# Patient Record
Sex: Female | Born: 1990 | Race: Black or African American | Hispanic: No | Marital: Single | Smoking: Former smoker
Health system: Southern US, Community
[De-identification: ages and names within clinical notes are randomized; demographics above are authoritative.]

## PROBLEM LIST (undated history)

## (undated) DIAGNOSIS — T7840XA Allergy, unspecified, initial encounter: Secondary | ICD-10-CM

## (undated) DIAGNOSIS — D649 Anemia, unspecified: Secondary | ICD-10-CM

## (undated) DIAGNOSIS — I10 Essential (primary) hypertension: Secondary | ICD-10-CM

## (undated) DIAGNOSIS — G43909 Migraine, unspecified, not intractable, without status migrainosus: Secondary | ICD-10-CM

## (undated) DIAGNOSIS — A549 Gonococcal infection, unspecified: Secondary | ICD-10-CM

## (undated) DIAGNOSIS — E7800 Pure hypercholesterolemia, unspecified: Secondary | ICD-10-CM

## (undated) DIAGNOSIS — K219 Gastro-esophageal reflux disease without esophagitis: Secondary | ICD-10-CM

## (undated) DIAGNOSIS — E119 Type 2 diabetes mellitus without complications: Secondary | ICD-10-CM

## (undated) DIAGNOSIS — A749 Chlamydial infection, unspecified: Secondary | ICD-10-CM

## (undated) DIAGNOSIS — J45909 Unspecified asthma, uncomplicated: Secondary | ICD-10-CM

## (undated) DIAGNOSIS — K3580 Unspecified acute appendicitis: Secondary | ICD-10-CM

## (undated) DIAGNOSIS — N390 Urinary tract infection, site not specified: Secondary | ICD-10-CM

## (undated) HISTORY — DX: Allergy, unspecified, initial encounter: T78.40XA

## (undated) HISTORY — DX: Gastro-esophageal reflux disease without esophagitis: K21.9

---
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
## 2012-12-09 ENCOUNTER — Emergency Department (HOSPITAL_COMMUNITY)
Admission: EM | Admit: 2012-12-09 | Discharge: 2012-12-09 | Disposition: A | Discharge status: Home / Self Care | Payer: Self-pay | Attending: Emergency Medicine | Admitting: Emergency Medicine

## 2012-12-09 ENCOUNTER — Encounter (HOSPITAL_COMMUNITY): Payer: Self-pay | Admitting: Emergency Medicine

## 2012-12-09 DIAGNOSIS — R110 Nausea: Secondary | ICD-10-CM | POA: Insufficient documentation

## 2012-12-09 DIAGNOSIS — R51 Headache: Secondary | ICD-10-CM | POA: Insufficient documentation

## 2012-12-09 DIAGNOSIS — Z8679 Personal history of other diseases of the circulatory system: Secondary | ICD-10-CM | POA: Insufficient documentation

## 2012-12-09 DIAGNOSIS — Z79899 Other long term (current) drug therapy: Secondary | ICD-10-CM | POA: Insufficient documentation

## 2012-12-09 DIAGNOSIS — H53149 Visual discomfort, unspecified: Secondary | ICD-10-CM | POA: Insufficient documentation

## 2012-12-09 DIAGNOSIS — F17200 Nicotine dependence, unspecified, uncomplicated: Secondary | ICD-10-CM | POA: Insufficient documentation

## 2012-12-09 HISTORY — DX: Migraine, unspecified, not intractable, without status migrainosus: G43.909

## 2012-12-09 MED ORDER — METOCLOPRAMIDE HCL 5 MG/ML IJ SOLN
10.0000 mg | Freq: Once | INTRAMUSCULAR | Status: AC
  Administered 2012-12-09: 10 mg via INTRAMUSCULAR
  Filled 2012-12-09: qty 2

## 2012-12-09 MED ORDER — DIPHENHYDRAMINE HCL 50 MG/ML IJ SOLN
25.0000 mg | Freq: Once | INTRAMUSCULAR | Status: AC
  Administered 2012-12-09: 25 mg via INTRAMUSCULAR
  Filled 2012-12-09: qty 1

## 2012-12-09 NOTE — ED Provider Notes (Signed)
CSN: [id number]     Arrival date & time 12/09/12  1705 History   First MD Initiated Contact with Patient 12/09/12 2016     Chief Complaint  Patient presents with  . Headache   (Consider location/radiation/quality/duration/timing/severity/associated sxs/prior Treatment) HPI Comments: Patient is a 22 year old female past medical history significant for migraines and tobacco use presented to the emergency department for 3 days of intermittent by temporal sharp headache with intermittent associated blurry vision and nausea. Patient denies any blurry vision or nausea at this time. Patient endorses that she has tried [facility] powder and Tylenol with no relief. She states light aggravates her pain. She rates her pain 7/10. She denies any numbness or weakness, vomiting, chest pain, shortness of breath, abdominal pain.   Past Medical History  Diagnosis Date  . Migraine    History reviewed. No pertinent past surgical history. History reviewed. No pertinent family history. History  Substance Use Topics  . Smoking status: Current Every Day Smoker  . Smokeless tobacco: Not on file  . Alcohol Use: Yes     Comment: occ   OB History   Grav Para Term Preterm Abortions TAB SAB Ect Mult Living                 Review of Systems  Constitutional: Negative for fever and chills.  HENT: Negative for neck pain and neck stiffness.   Eyes: Positive for photophobia and visual disturbance.  Respiratory: Negative for shortness of breath.   Cardiovascular: Negative for chest pain.  Gastrointestinal: Positive for nausea.  Neurological: Positive for headaches.  All other systems reviewed and are negative.    Allergies  Review of patient's allergies indicates no known allergies.  Home Medications   Current Outpatient Rx  Name  Route  Sig  Dispense  Refill  . Aspirin-Salicylamide-Caffeine (BC FAST PAIN RELIEF) [id number] MG PACK   Oral   Take 1 Package by mouth every 8 (eight) hours as needed.          Marland Kitchen BIOTIN PO   Oral   Take 1 tablet by mouth daily.          BP 117/83  Pulse 56  Temp(Src) 98.2 F (36.8 C) (Oral)  Resp 12  SpO2 100% Physical Exam  Constitutional: She is oriented to person, place, and time. She appears well-developed and well-nourished. No distress.  Lights on in room upon my entrance.  HENT:  Head: Normocephalic and atraumatic.  Right Ear: External ear normal.  Left Ear: External ear normal.  Nose: Nose normal.  Mouth/Throat: Oropharynx is clear and moist.  Eyes: Conjunctivae and EOM are normal. Pupils are equal, round, and reactive to light.  Neck: Normal range of motion and full passive range of motion without pain. Neck supple. No rigidity. No edema present.  Cardiovascular: Normal rate, regular rhythm, normal heart sounds and intact distal pulses.   Pulmonary/Chest: Effort normal and breath sounds normal. No respiratory distress.  Abdominal: Soft. Bowel sounds are normal. There is no tenderness.  Musculoskeletal: Normal range of motion. She exhibits no edema.  Lymphadenopathy:    She has no cervical adenopathy.  Neurological: She is alert and oriented to person, place, and time. She has normal strength. No cranial nerve deficit or sensory deficit. GCS eye subscore is 4. GCS verbal subscore is 5. GCS motor subscore is 6.  No pronator drift. Bilateral heel-knee-and intact.  Skin: Skin is warm and dry. She is not diaphoretic.  Psychiatric: She has a normal mood and affect.  ED Course  Procedures (including critical care time) Medications  metoCLOPramide (REGLAN) injection 10 mg (10 mg Intramuscular Given 12/09/12 2058)  diphenhydrAMINE (BENADRYL) injection 25 mg (25 mg Intramuscular Given 12/09/12 2058)    Labs Review Labs Reviewed - No data to display Imaging Review No results found.  MDM   1. Headache    Afebrile, NAD, non-toxic appearing, AAOx4. Pt HA treated and improved while in ED.  Presentation is like pts typical HA and non  concerning for [facility], ICH, Meningitis, or temporal arteritis. Pt is afebrile with no focal neuro deficits, nuchal rigidity, or change in vision. Pt is to follow up with a PCP to discuss prophylactic medication. Pt verbalizes understanding and is agreeable with plan to dc. Patient d/w with Dr. Rubin Payor, agrees with plan.        Jeannetta Ellis, PA-C 12/10/12 0134

## 2012-12-09 NOTE — ED Notes (Signed)
Pt c/o HA x 3 days with blurry vision and nausea; pt sts hx of migraine

## 2012-12-11 NOTE — ED Provider Notes (Signed)
Medical screening examination/treatment/procedure(s) were performed by non-physician practitioner and as supervising physician I was immediately available for consultation/collaboration.  Juliet Rude. Rubin Payor, MD 12/11/12 [id number]

## 2014-01-08 ENCOUNTER — Other Ambulatory Visit: Payer: Self-pay | Admitting: Obstetrics & Gynecology

## 2014-01-08 DIAGNOSIS — O3680X0 Pregnancy with inconclusive fetal viability, not applicable or unspecified: Secondary | ICD-10-CM

## 2014-01-11 ENCOUNTER — Ambulatory Visit: Payer: Medicaid Other

## 2014-03-23 ENCOUNTER — Other Ambulatory Visit: Payer: Self-pay | Admitting: Obstetrics & Gynecology

## 2014-03-23 DIAGNOSIS — O3432 Maternal care for cervical incompetence, second trimester: Secondary | ICD-10-CM

## 2014-03-24 ENCOUNTER — Emergency Department (HOSPITAL_COMMUNITY)
Admission: EM | Admit: 2014-03-24 | Discharge: 2014-03-25 | Disposition: A | Discharge status: Home / Self Care | Payer: Medicaid Other | Attending: Emergency Medicine | Admitting: Emergency Medicine

## 2014-03-24 ENCOUNTER — Encounter (HOSPITAL_COMMUNITY): Payer: Self-pay

## 2014-03-24 DIAGNOSIS — R1084 Generalized abdominal pain: Secondary | ICD-10-CM | POA: Insufficient documentation

## 2014-03-24 DIAGNOSIS — O9989 Other specified diseases and conditions complicating pregnancy, childbirth and the puerperium: Secondary | ICD-10-CM | POA: Insufficient documentation

## 2014-03-24 DIAGNOSIS — Z3A00 Weeks of gestation of pregnancy not specified: Secondary | ICD-10-CM | POA: Insufficient documentation

## 2014-03-24 DIAGNOSIS — N390 Urinary tract infection, site not specified: Secondary | ICD-10-CM

## 2014-03-24 DIAGNOSIS — Z8679 Personal history of other diseases of the circulatory system: Secondary | ICD-10-CM | POA: Insufficient documentation

## 2014-03-24 DIAGNOSIS — O2342 Unspecified infection of urinary tract in pregnancy, second trimester: Secondary | ICD-10-CM | POA: Diagnosis not present

## 2014-03-24 DIAGNOSIS — Z87891 Personal history of nicotine dependence: Secondary | ICD-10-CM | POA: Diagnosis not present

## 2014-03-24 DIAGNOSIS — Z331 Pregnant state, incidental: Secondary | ICD-10-CM

## 2014-03-24 DIAGNOSIS — Z79899 Other long term (current) drug therapy: Secondary | ICD-10-CM | POA: Insufficient documentation

## 2014-03-24 DIAGNOSIS — O212 Late vomiting of pregnancy: Secondary | ICD-10-CM | POA: Diagnosis not present

## 2014-03-24 LAB — URINE MICROSCOPIC-ADD ON

## 2014-03-24 LAB — URINALYSIS, ROUTINE W REFLEX MICROSCOPIC
Bilirubin Urine: NEGATIVE
GLUCOSE, UA: NEGATIVE mg/dL
Ketones, ur: NEGATIVE mg/dL
Nitrite: NEGATIVE
Protein, ur: NEGATIVE mg/dL
Specific Gravity, Urine: <1.005 — ABNORMAL LOW (ref 1.005–1.030)
Urobilinogen, UA: 0.2 mg/dL (ref 0.0–1.0)
pH: 6 (ref 5.0–8.0)

## 2014-03-24 LAB — CBC WITH DIFFERENTIAL/PLATELET
Basophils Absolute: 0 10*3/uL (ref 0.0–0.1)
Basophils Relative: 0 % (ref 0–1)
Eosinophils Absolute: 0 10*3/uL (ref 0.0–0.7)
Eosinophils Relative: 0 % (ref 0–5)
HCT: 33 % — ABNORMAL LOW (ref 36.0–46.0)
Hemoglobin: 10.8 g/dL — ABNORMAL LOW (ref 12.0–15.0)
LYMPHS PCT: 8 % — AB (ref 12–46)
Lymphs Abs: 0.9 10*3/uL (ref 0.7–4.0)
MCH: 23.9 pg — ABNORMAL LOW (ref 26.0–34.0)
MCHC: 32.7 g/dL (ref 30.0–36.0)
MCV: 73.2 fL — AB (ref 78.0–100.0)
MONO ABS: 0.8 10*3/uL (ref 0.1–1.0)
Monocytes Relative: 7 % (ref 3–12)
Neutro Abs: 9.7 10*3/uL — ABNORMAL HIGH (ref 1.7–7.7)
Neutrophils Relative %: 85 % — ABNORMAL HIGH (ref 43–77)
Platelets: 207 10*3/uL (ref 150–400)
RBC: 4.51 MIL/uL (ref 3.87–5.11)
RDW: 14.8 % (ref 11.5–15.5)
WBC: 11.4 10*3/uL — AB (ref 4.0–10.5)

## 2014-03-24 LAB — BASIC METABOLIC PANEL
ANION GAP: 7 (ref 5–15)
BUN: 7 mg/dL (ref 6–23)
CO2: 24 mmol/L (ref 19–32)
Calcium: 8.5 mg/dL (ref 8.4–10.5)
Chloride: 101 mEq/L (ref 96–112)
Creatinine, Ser: 0.88 mg/dL (ref 0.50–1.10)
GFR calc Af Amer: >90 mL/min (ref >90)
GFR calc non Af Amer: >90 mL/min (ref >90)
GLUCOSE: 82 mg/dL (ref 70–99)
POTASSIUM: 3.4 mmol/L — AB (ref 3.5–5.1)
SODIUM: 132 mmol/L — AB (ref 135–145)

## 2014-03-24 MED ORDER — ONDANSETRON 8 MG PO TBDP
8.0000 mg | ORAL_TABLET | Freq: Once | ORAL | Status: AC
  Administered 2014-03-24: 8 mg via ORAL
  Filled 2014-03-24: qty 1

## 2014-03-24 MED ORDER — CEPHALEXIN 500 MG PO CAPS
500.0000 mg | ORAL_CAPSULE | Freq: Once | ORAL | Status: AC
  Administered 2014-03-24: 500 mg via ORAL
  Filled 2014-03-24: qty 1

## 2014-03-24 MED ORDER — CEPHALEXIN 500 MG PO CAPS: 500.0000 mg | ORAL_CAPSULE | Freq: Four times a day (QID) | ORAL | Status: DC

## 2014-03-24 NOTE — Discharge Instructions (Signed)
Urinary Tract Infection Urinary tract infections (UTIs) can develop anywhere along your urinary tract. Your urinary tract is your body's drainage system for removing wastes and extra water. Your urinary tract includes two kidneys, two ureters, a bladder, and a urethra. Your kidneys are a pair of bean-shaped organs. Each kidney is about the size of your fist. They are located below your ribs, one on each side of your spine. CAUSES Infections are caused by microbes, which are microscopic organisms, including fungi, viruses, and bacteria. These organisms are so small that they can only be seen through a microscope. Bacteria are the microbes that most commonly cause UTIs. SYMPTOMS  Symptoms of UTIs may vary by age and gender of the patient and by the location of the infection. Symptoms in young women typically include a frequent and intense urge to urinate and a painful, burning feeling in the bladder or urethra during urination. Older women and men are more likely to be tired, shaky, and weak and have muscle aches and abdominal pain. A fever may mean the infection is in your kidneys. Other symptoms of a kidney infection include pain in your back or sides below the ribs, nausea, and vomiting. DIAGNOSIS To diagnose a UTI, your caregiver will ask you about your symptoms. Your caregiver also will ask to provide a urine sample. The urine sample will be tested for bacteria and white blood cells. White blood cells are made by your body to help fight infection. TREATMENT  Typically, UTIs can be treated with medication. Because most UTIs are caused by a bacterial infection, they usually can be treated with the use of antibiotics. The choice of antibiotic and length of treatment depend on your symptoms and the type of bacteria causing your infection. HOME CARE INSTRUCTIONS  If you were prescribed antibiotics, take them exactly as your caregiver instructs you. Finish the medication even if you feel better after you  have only taken some of the medication.  Drink enough water and fluids to keep your urine clear or pale yellow.  Avoid caffeine, tea, and carbonated beverages. They tend to irritate your bladder.  Empty your bladder often. Avoid holding urine for long periods of time.  Empty your bladder before and after sexual intercourse.  After a bowel movement, women should cleanse from front to back. Use each tissue only once. SEEK MEDICAL CARE IF:   You have back pain.  You develop a fever.  Your symptoms do not begin to resolve within 3 days. SEEK IMMEDIATE MEDICAL CARE IF:   You have severe back pain or lower abdominal pain.  You develop chills.  You have nausea or vomiting.  You have continued burning or discomfort with urination. MAKE SURE YOU:   Understand these instructions.  Will watch your condition.  Will get help right away if you are not doing well or get worse. Document Released: 12/13/2004 Document Revised: 09/04/2011 Document Reviewed: 04/13/2011 [facility] Patient Information 2015 [place], [place]. This information is not intended to replace advice given to you by your health care provider. Make sure you discuss any questions you have with your health care provider.  Second Trimester of Pregnancy The second trimester is from week 13 through week 28, months 4 through 6. The second trimester is often a time when you feel your best. Your body has also adjusted to being pregnant, and you begin to feel better physically. Usually, morning sickness has lessened or quit completely, you may have more energy, and you may have an increase in appetite.  The second trimester is also a time when the fetus is growing rapidly. At the end of the sixth month, the fetus is about 9 inches long and weighs about 1 pounds. You will likely begin to feel the baby move (quickening) between 18 and 20 weeks of the pregnancy. BODY CHANGES Your body goes through many changes during pregnancy. The changes  vary from woman to woman.   Your weight will continue to increase. You will notice your lower abdomen bulging out.  You may begin to get stretch marks on your hips, abdomen, and breasts.  You may develop headaches that can be relieved by medicines approved by your health care provider.  You may urinate more often because the fetus is pressing on your bladder.  You may develop or continue to have heartburn as a result of your pregnancy.  You may develop constipation because certain hormones are causing the muscles that push waste through your intestines to slow down.  You may develop hemorrhoids or swollen, bulging veins (varicose veins).  You may have back pain because of the weight gain and pregnancy hormones relaxing your joints between the bones in your pelvis and as a result of a shift in weight and the muscles that support your balance.  Your breasts will continue to grow and be tender.  Your gums may bleed and may be sensitive to brushing and flossing.  Dark spots or blotches (chloasma, mask of pregnancy) may develop on your face. This will likely fade after the baby is born.  A dark line from your belly button to the pubic area (linea nigra) may appear. This will likely fade after the baby is born.  You may have changes in your hair. These can include thickening of your hair, rapid growth, and changes in texture. Some women also have hair loss during or after pregnancy, or hair that feels dry or thin. Your hair will most likely return to normal after your baby is born. WHAT TO EXPECT AT YOUR PRENATAL VISITS During a routine prenatal visit:  You will be weighed to make sure you and the fetus are growing normally.  Your blood pressure will be taken.  Your abdomen will be measured to track your baby's growth.  The fetal heartbeat will be listened to.  Any test results from the previous visit will be discussed. Your health care provider may ask you:  How you are  feeling.  If you are feeling the baby move.  If you have had any abnormal symptoms, such as leaking fluid, bleeding, severe headaches, or abdominal cramping.  If you have any questions. Other tests that may be performed during your second trimester include:  Blood tests that check for:  Low iron levels (anemia).  Gestational diabetes (between 24 and 28 weeks).  Rh antibodies.  Urine tests to check for infections, diabetes, or protein in the urine.  An ultrasound to confirm the proper growth and development of the baby.  An amniocentesis to check for possible genetic problems.  Fetal screens for spina bifida and Down syndrome. HOME CARE INSTRUCTIONS   Avoid all smoking, herbs, alcohol, and unprescribed drugs. These chemicals affect the formation and growth of the baby.  Follow your health care provider's instructions regarding medicine use. There are medicines that are either safe or unsafe to take during pregnancy.  Exercise only as directed by your health care provider. Experiencing uterine cramps is a good sign to stop exercising.  Continue to eat regular, healthy meals.  Wear a good  support bra for breast tenderness.  Do not use hot tubs, steam rooms, or saunas.  Wear your seat belt at all times when driving.  Avoid raw meat, uncooked cheese, cat litter boxes, and soil used by cats. These carry germs that can cause birth defects in the baby.  Take your prenatal vitamins.  Try taking a stool softener (if your health care provider approves) if you develop constipation. Eat more high-fiber foods, such as fresh vegetables or fruit and whole grains. Drink plenty of fluids to keep your urine clear or pale yellow.  Take warm sitz baths to soothe any pain or discomfort caused by hemorrhoids. Use hemorrhoid cream if your health care provider approves.  If you develop varicose veins, wear support hose. Elevate your feet for 15 minutes, 3-4 times a day. Limit salt in your  diet.  Avoid heavy lifting, wear low heel shoes, and practice good posture.  Rest with your legs elevated if you have leg cramps or low back pain.  Visit your dentist if you have not gone yet during your pregnancy. Use a soft toothbrush to brush your teeth and be gentle when you floss.  A sexual relationship may be continued unless your health care provider directs you otherwise.  Continue to go to all your prenatal visits as directed by your health care provider. SEEK MEDICAL CARE IF:   You have dizziness.  You have mild pelvic cramps, pelvic pressure, or nagging pain in the abdominal area.  You have persistent nausea, vomiting, or diarrhea.  You have a bad smelling vaginal discharge.  You have pain with urination. SEEK IMMEDIATE MEDICAL CARE IF:   You have a fever.  You are leaking fluid from your vagina.  You have spotting or bleeding from your vagina.  You have severe abdominal cramping or pain.  You have rapid weight gain or loss.  You have shortness of breath with chest pain.  You notice sudden or extreme swelling of your face, hands, ankles, feet, or legs.  You have not felt your baby move in over an hour.  You have severe headaches that do not go away with medicine.  You have vision changes. Document Released: 02/27/2001 Document Revised: 03/10/2013 Document Reviewed: 05/06/2012 [facility] Patient Information 2015 [place], [place]. This information is not intended to replace advice given to you by your health care provider. Make sure you discuss any questions you have with your health care provider.

## 2014-03-24 NOTE — ED Provider Notes (Signed)
CSN: [id number]     Arrival date & time 03/24/14  2111 History  This chart was scribed for Flint MelterElliott L Tienna Bienkowski, MD by Bronson CurbJacqueline Melvin, ED Scribe. This patient was seen in room APA12/APA12 and the patient's care was started at 10:19 PM.     Chief Complaint  Patient presents with  . Abdominal Pain    The history is provided by the patient. No language interpreter was used.     HPI Comments: Lindsay James is a 24 y.o. female who is 5 month pregnant, who presents to the Emergency Department complaining of constant, generalized abdominal pain for the past 2 days. Patient states she has not eaten aything in the past 2 days. She also notes associated nausea and vomiting whenever she does eat, in addition to dysuria and frequency. Patient denies history of UTI. She reports that she can feel the baby kick and move. She denies any complications with her current pregnancy, but states her cervix was 1.5cm when last checked by her OB/GYN. Patient is currently taking prenatal vitamins, OTC Tylenol and a nightly vaginal suppository for her cervix. Informed her cervix was 1.5 last check by her OBGYN.  She denies any other symptoms.    Past Medical History  Diagnosis Date  . Migraine    History reviewed. No pertinent past surgical history. History reviewed. No pertinent family history. History  Substance Use Topics  . Smoking status: Former Smoker    Quit date: 10/22/2013  . Smokeless tobacco: Not on file  . Alcohol Use: No     Comment: occ   OB History    Gravida Para Term Preterm AB TAB SAB Ectopic Multiple Living   1              Review of Systems  Constitutional: Positive for fever.  Gastrointestinal: Positive for nausea, vomiting and abdominal pain.  Genitourinary: Positive for dysuria and frequency.  All other systems reviewed and are negative.     Allergies  Review of patient's allergies indicates no known allergies.  Home Medications   Prior to Admission medications    Medication Sig Start Date End Date Taking? Authorizing Provider  acetaminophen (TYLENOL) 325 MG tablet Take by mouth daily as needed for mild pain, moderate pain or headache.   Yes Historical Provider, MD  prenatal vitamin w/FE, FA (PRENATAL 1 + 1) 27-1 MG TABS tablet Take 1 tablet by mouth daily at 12 noon.   Yes Historical Provider, MD  PRESCRIPTION MEDICATION Place 1 suppository vaginally at bedtime.   Yes Historical Provider, MD  cephALEXin (KEFLEX) 500 MG capsule Take 1 capsule (500 mg total) by mouth 4 (four) times daily. 03/24/14   Flint MelterElliott L Goldye Tourangeau, MD   Triage Vitals: BP 137/80 mmHg  Pulse 87  Temp(Src) 99.3 F (37.4 C) (Oral)  Resp 24  Ht 5\' 4"  (1.626 m)  Wt 261 lb 1.6 oz (118.434 kg)  BMI 44.80 kg/m2  SpO2 100%  Physical Exam  Constitutional: She is oriented to person, place, and time. She appears well-developed and well-nourished.  HENT:  Head: Normocephalic and atraumatic.  Eyes: Conjunctivae and EOM are normal. Pupils are equal, round, and reactive to light.  Neck: Normal range of motion and phonation normal. Neck supple.  Cardiovascular: Normal rate and regular rhythm.   Pulmonary/Chest: Effort normal and breath sounds normal. She exhibits no tenderness.  Abdominal: Soft. Bowel sounds are normal. She exhibits no distension. There is tenderness. There is no guarding.  Gravid uterus is palpated above the  umbilicus. Mild diffuse tenderness.   Musculoskeletal: Normal range of motion.  Neurological: She is alert and oriented to person, place, and time. She exhibits normal muscle tone.  Skin: Skin is warm and dry.  Psychiatric: She has a normal mood and affect. Her behavior is normal. Judgment and thought content normal.  Nursing note and vitals reviewed.   ED Course  Procedures (including critical care time)  DIAGNOSTIC STUDIES: Oxygen Saturation is 100% on room air, normal by my interpretation.    COORDINATION OF CARE: At 2224 Discussed treatment plan with patient  which includes fluids. Patient agrees.   Labs Review Labs Reviewed  BASIC METABOLIC PANEL - Abnormal; Notable for the following:    Sodium 132 (*)    Potassium 3.4 (*)    All other components within normal limits  CBC WITH DIFFERENTIAL - Abnormal; Notable for the following:    WBC 11.4 (*)    Hemoglobin 10.8 (*)    HCT 33.0 (*)    MCV 73.2 (*)    MCH 23.9 (*)    Neutrophils Relative % 85 (*)    Lymphocytes Relative 8 (*)    Neutro Abs 9.7 (*)    All other components within normal limits  URINALYSIS, ROUTINE W REFLEX MICROSCOPIC - Abnormal; Notable for the following:    Specific Gravity, Urine <1.005 (*)    Hgb urine dipstick TRACE (*)    Leukocytes, UA MODERATE (*)    All other components within normal limits  URINE MICROSCOPIC-ADD ON - Abnormal; Notable for the following:    Squamous Epithelial / LPF FEW (*)    Bacteria, UA FEW (*)    All other components within normal limits  URINE CULTURE    Imaging Review No results found.   EKG Interpretation None      MDM   Final diagnoses:  UTI (lower urinary tract infection)  Pregnancy, normal, incidental    UTI with 2nd trimester pregnancy and no apparent complications. Doubt SBI or metabolic instability.  Nursing Notes Reviewed/ Care Coordinated Applicable Imaging Reviewed Interpretation of Laboratory Data incorporated into ED treatment  The patient appears reasonably screened and/or stabilized for discharge and I doubt any other medical condition or other [facility] requiring further screening, evaluation, or treatment in the ED at this time prior to discharge.  Plan: Home Medications- Keflex; Home Treatments- rest, fluids; return here if the recommended treatment, does not improve the symptoms; Recommended follow up- OB check up in 1 week   I personally performed the services described in this documentation, which was scribed in my presence. The recorded information has been reviewed and is accurate.      Flint Melter, MD 03/25/14 [phone]

## 2014-03-24 NOTE — ED Notes (Signed)
Patient is 5 months pregnant

## 2014-03-24 NOTE — ED Notes (Signed)
Pt was able to drink fluids w/o any difficulties. 

## 2014-03-24 NOTE — ED Notes (Signed)
Patient states abdominal pain with NVD and fever that started yesterday.

## 2014-03-26 LAB — URINE CULTURE: Colony Count: 25000

## 2014-03-30 ENCOUNTER — Other Ambulatory Visit (HOSPITAL_COMMUNITY): Payer: Self-pay | Admitting: Obstetrics and Gynecology

## 2014-03-30 ENCOUNTER — Encounter (HOSPITAL_COMMUNITY): Payer: Self-pay

## 2014-03-30 ENCOUNTER — Ambulatory Visit (HOSPITAL_COMMUNITY)
Admission: RE | Admit: 2014-03-30 | Discharge: 2014-03-30 | Disposition: A | Discharge status: Home / Self Care | Payer: Medicaid Other | Source: Ambulatory Visit | Attending: Obstetrics and Gynecology | Admitting: Obstetrics and Gynecology

## 2014-03-30 ENCOUNTER — Other Ambulatory Visit (HOSPITAL_COMMUNITY): Payer: Self-pay | Admitting: Obstetrics & Gynecology

## 2014-03-30 VITALS — BP 117/58 | HR 63 | Wt 266.0 lb

## 2014-03-30 DIAGNOSIS — IMO0001 Reserved for inherently not codable concepts without codable children: Secondary | ICD-10-CM

## 2014-03-30 DIAGNOSIS — O350XX0 Maternal care for (suspected) central nervous system malformation in fetus, not applicable or unspecified: Secondary | ICD-10-CM | POA: Insufficient documentation

## 2014-03-30 DIAGNOSIS — O3432 Maternal care for cervical incompetence, second trimester: Secondary | ICD-10-CM

## 2014-03-30 DIAGNOSIS — Z3A26 26 weeks gestation of pregnancy: Secondary | ICD-10-CM | POA: Diagnosis not present

## 2014-03-30 DIAGNOSIS — O26872 Cervical shortening, second trimester: Secondary | ICD-10-CM | POA: Insufficient documentation

## 2014-03-30 DIAGNOSIS — O350XX1 Maternal care for (suspected) central nervous system malformation in fetus, fetus 1: Secondary | ICD-10-CM

## 2014-03-30 NOTE — Consult Note (Signed)
Maternal Fetal Medicine Consultation  Requesting Provider(s): Silas Flood, MD  Reason for consultation: Cervical shortening, cerebral ventriculomegaly  HPI: Lindsay James is a 24 yo G1P0, EDD 07/03/2014 who is currently at 26w 3d who is seen for consultation due to bilateral cerebral ventriculomegaly and cervical shortening.  The patient is currently on progesterone suppositories, but has not yet completed a course of betamethasone.  She is asymptomatic and denies contractions, vaginal bleeding or leakage of fluid.  Cerebral ventriculomegaly was noted no recent ultrasound.  Her  Prenatal course has otherwise been uncomplicated.  OB History: OB History    Gravida Para Term Preterm AB TAB SAB Ectopic Multiple Living   1               PMH:  Past Medical History  Diagnosis Date  . Migraine     PSH: History reviewed. No pertinent past surgical history.   Meds:  Current Outpatient Prescriptions on File Prior to Encounter  Medication Sig Dispense Refill  . acetaminophen (TYLENOL) 325 MG tablet Take by mouth daily as needed for mild pain, moderate pain or headache.    . cephALEXin (KEFLEX) 500 MG capsule Take 1 capsule (500 mg total) by mouth 4 (four) times daily. 28 capsule 0  . prenatal vitamin w/FE, FA (PRENATAL 1 + 1) 27-1 MG TABS tablet Take 1 tablet by mouth daily at 12 noon.    Marland Kitchen PRESCRIPTION MEDICATION Place 1 suppository vaginally at bedtime.     No current facility-administered medications on file prior to encounter.   Allergies: No Known Allergies   FH: History reviewed. No pertinent family history.  Denies family history of birth defects or hereditary disorders.   Soc:  Review of Systems: no vaginal bleeding or cramping/contractions, no LOF, no nausea/vomiting. All other systems reviewed and are negative.  PE:   Filed Vitals:   03/30/14 0934  BP: 117/58  Pulse: 63    GEN: well-appearing female ABD: gravid, NT  Ultrasound: Single IUP at 26w 3d Mild  bilateral cerebral ventriculomegaly is noted. (1.1-1.2 cm) A "drooping choroid" sign is noted. The 3rd ventricle does not appear to be dilated. The fetal cavum is poorly visualized. The remainder of the fetal cranial anatomy appears normal. Fetal growth is appropriate (58th %tile) Posterior placenta without previa Normal amniotic fluid volume  TVUS - cervical length 1.3 cm.  Some V-shaped funneling is noted.   A/P: 1) Single IUP at 26w 3d         2) Mild, bilateral cerebral ventriculomegaly - the findings and limitations of the ultrasound were discussed with the patient.  Ventriculomegaly may be associated with an obstructive process.  No spinal defects were appreciated.  The cavum septum pellucidum was not well-visualized which could raise some concerns about possible agenesis of the corpus collosum.  The remainder of the cranial anatomy appears normal.  Ventriculomegaly may be associated with aneuploidy or possible fetal viral infections, although in the absence of other findings, feel that the likelihood is small.  After counseling, the patient elected to undergo cell free fetal DNA and viral serologies (CMV, toxo, parvo).  We also briefly discussed the possibility of neurodevelopmental disorders and the small risk of requiring a VP shunt should worsening ventriculomegaly/ hydrocephalus develop.  Recommend follow up ultrasound with MFM in 4 weeks to reevaluate.  Would consider fetal MRI if unable to visualize the CSP at that time.  The newborn will also likely require imaging studies of the cranium after delivery.  3) Cervical shortening - concur with vaginal progesterone supplementation.  Would also recommend a course of bet[facility]. She is currently asymptomatic - preterm labor precautions given.   Thank you for the opportunity to be a part of the care of Lindsay James. Please contact our office if we can be of further assistance.   I spent  approximately 30 minutes with this patient with over 50% of time spent in face-to-face counseling.  Alpha GulaPaul Maymuna Detzel, MD Maternal Fetal Medicine

## 2014-03-31 ENCOUNTER — Other Ambulatory Visit (HOSPITAL_COMMUNITY): Payer: Self-pay

## 2014-03-31 LAB — TOXOPLASMA ANTIBODIES- IGG AND  IGM
Toxoplasma Antibody- IgM: 5.6 IU/mL (ref <8.0)
Toxoplasma IgG Ratio: <3 IU/mL (ref <7.2)

## 2014-03-31 LAB — CMV ANTIBODY, IGG (EIA)

## 2014-03-31 LAB — CMV IGM

## 2014-04-02 LAB — PARVOVIRUS B19 ANTIBODY, IGG AND IGM
PAROVIRUS B19 IGG ABS: 0.4 {index} (ref <0.9)
PAROVIRUS B19 IGM ABS: 0.1 {index} (ref <0.9)

## 2014-04-08 ENCOUNTER — Other Ambulatory Visit (HOSPITAL_COMMUNITY): Payer: Self-pay

## 2014-04-08 ENCOUNTER — Telehealth (HOSPITAL_COMMUNITY): Payer: Self-pay | Admitting: MS"

## 2014-04-08 NOTE — Telephone Encounter (Signed)
Called Lindsay BoSierra B Lubke to discuss her cell free fetal DNA test results.  Ms. Lindsay James had Panorama testing through [facility].  Testing was offered because of ultrasound finding of fetal cerebral ventriculomegaly.   The patient was identified by name and DOB.  We reviewed that these are within normal limits, showing a less than 1 in 10,000 risk for trisomies 21, 18 and 13, and monosomy X (Turner syndrome).  In addition, the risk for triploidy/vanishing twin and sex chromosome trisomies (47,XXX and 47,XXY) was also low risk.  We reviewed that this testing identifies > 99% of pregnancies with trisomy 5021, trisomy 5413, sex chromosome trisomies (47,XXX and 47,XXY), and triploidy. The detection rate for trisomy 18 is 96%.  The detection rate for monosomy X is ~92%.  The false positive rate is <0.1% for all conditions. Testing was also consistent with female fetal sex.  She understands that this testing does not identify all genetic conditions.    Additionally, maternal serology studies for toxoplasmosis, CMV, and parvovirus were within normal limits. All questions were answered to her satisfaction, she was encouraged to call with additional questions or concerns.  Quinn PlowmanKaren Weda Baumgarner, MS Certified Genetic Counselor 04/08/2014 11:17 AM

## 2014-04-27 ENCOUNTER — Ambulatory Visit (HOSPITAL_COMMUNITY)
Admission: RE | Admit: 2014-04-27 | Discharge: 2014-04-27 | Disposition: A | Discharge status: Home / Self Care | Payer: Medicaid Other | Source: Ambulatory Visit | Attending: Obstetrics and Gynecology | Admitting: Obstetrics and Gynecology

## 2014-04-27 ENCOUNTER — Encounter (HOSPITAL_COMMUNITY): Payer: Self-pay

## 2014-04-27 ENCOUNTER — Other Ambulatory Visit (HOSPITAL_COMMUNITY): Payer: Self-pay | Admitting: Maternal and Fetal Medicine

## 2014-04-27 DIAGNOSIS — O350XX1 Maternal care for (suspected) central nervous system malformation in fetus, fetus 1: Secondary | ICD-10-CM

## 2014-04-27 DIAGNOSIS — IMO0001 Reserved for inherently not codable concepts without codable children: Secondary | ICD-10-CM

## 2014-04-27 DIAGNOSIS — O350XX0 Maternal care for (suspected) central nervous system malformation in fetus, not applicable or unspecified: Secondary | ICD-10-CM | POA: Insufficient documentation

## 2014-04-27 DIAGNOSIS — Z36 Encounter for antenatal screening of mother: Secondary | ICD-10-CM | POA: Diagnosis not present

## 2014-04-27 DIAGNOSIS — O26873 Cervical shortening, third trimester: Secondary | ICD-10-CM | POA: Insufficient documentation

## 2014-04-27 DIAGNOSIS — O09299 Supervision of pregnancy with other poor reproductive or obstetric history, unspecified trimester: Secondary | ICD-10-CM | POA: Insufficient documentation

## 2014-04-27 DIAGNOSIS — O3432 Maternal care for cervical incompetence, second trimester: Secondary | ICD-10-CM | POA: Insufficient documentation

## 2014-04-27 DIAGNOSIS — IMO0002 Reserved for concepts with insufficient information to code with codable children: Secondary | ICD-10-CM | POA: Insufficient documentation

## 2014-04-27 DIAGNOSIS — Z3A30 30 weeks gestation of pregnancy: Secondary | ICD-10-CM | POA: Diagnosis not present

## 2014-05-25 ENCOUNTER — Ambulatory Visit (HOSPITAL_COMMUNITY)
Admission: RE | Admit: 2014-05-25 | Discharge: 2014-05-25 | Disposition: A | Discharge status: Home / Self Care | Payer: Medicaid Other | Source: Ambulatory Visit | Attending: Obstetrics and Gynecology | Admitting: Obstetrics and Gynecology

## 2014-05-25 DIAGNOSIS — O350XX1 Maternal care for (suspected) central nervous system malformation in fetus, fetus 1: Secondary | ICD-10-CM | POA: Diagnosis not present

## 2014-05-25 DIAGNOSIS — IMO0001 Reserved for inherently not codable concepts without codable children: Secondary | ICD-10-CM

## 2014-05-25 DIAGNOSIS — O3432 Maternal care for cervical incompetence, second trimester: Secondary | ICD-10-CM | POA: Diagnosis not present

## 2014-05-25 DIAGNOSIS — Z3A34 34 weeks gestation of pregnancy: Secondary | ICD-10-CM | POA: Insufficient documentation

## 2015-02-02 ENCOUNTER — Encounter (HOSPITAL_COMMUNITY): Payer: Self-pay | Admitting: *Deleted

## 2015-07-22 IMAGING — US US OB FOLLOW-UP
1 series · 12 of 28 positions shown · non-contrast
Comparison: none

[Series 1: us ob follow-up · 0.21mm/px · 12 of 82 slices shown]
[im 4/82]
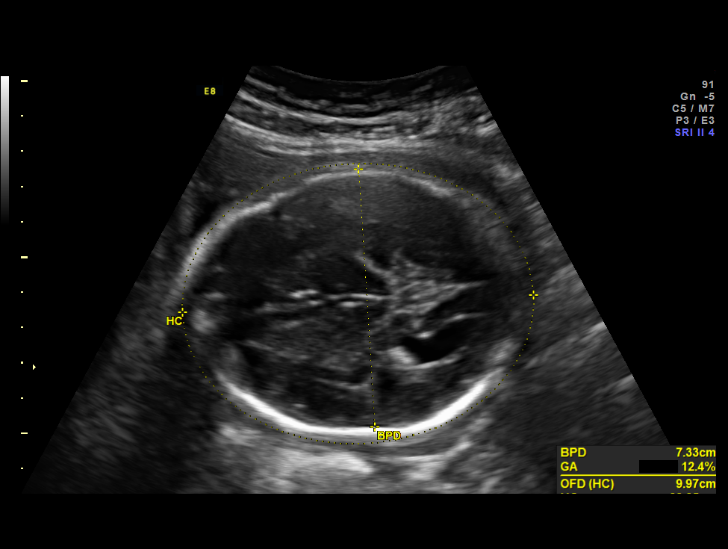
[im 10/82]
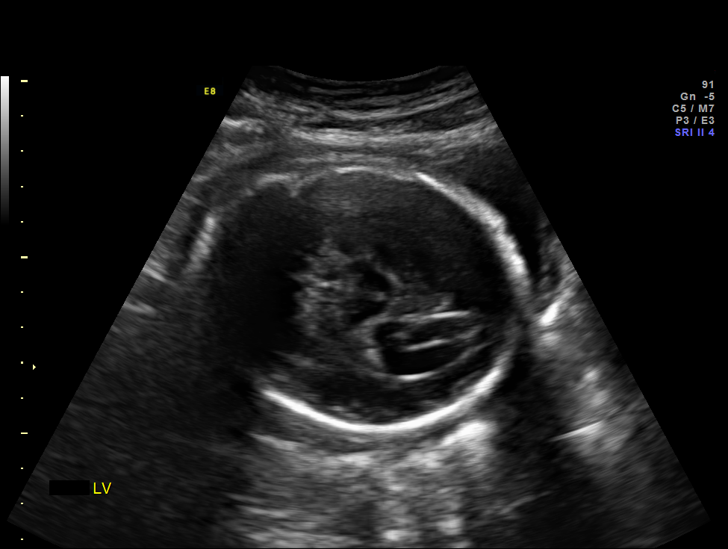
[im 16/82]
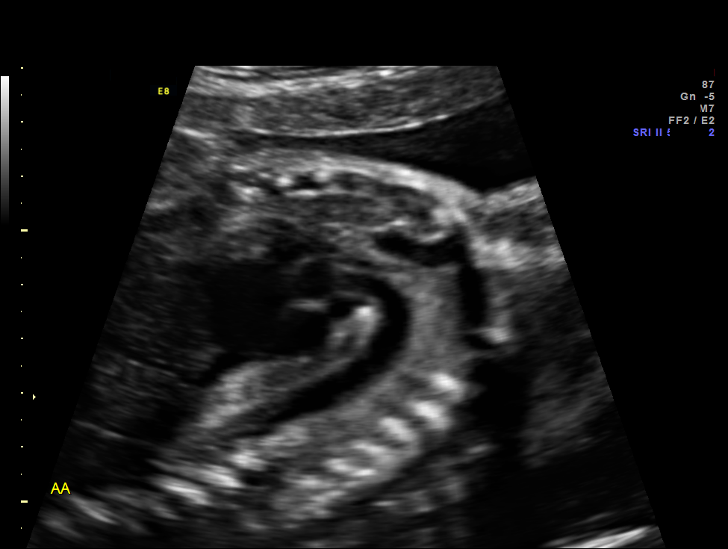
[im 25/82]
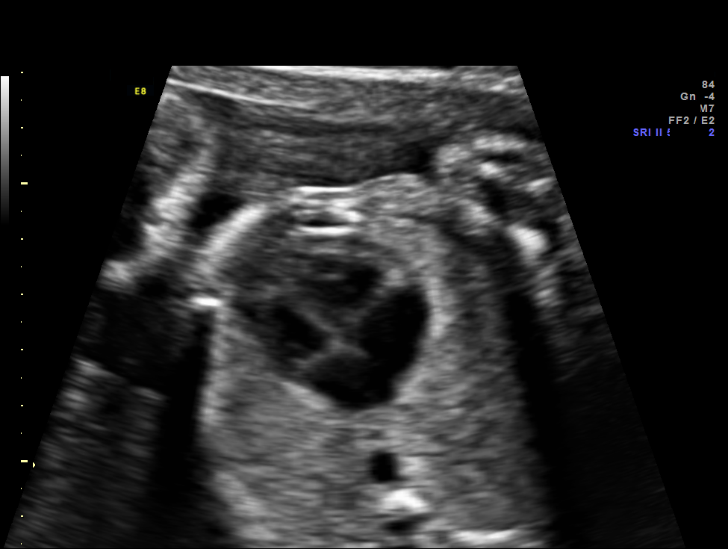
[im 31/82]
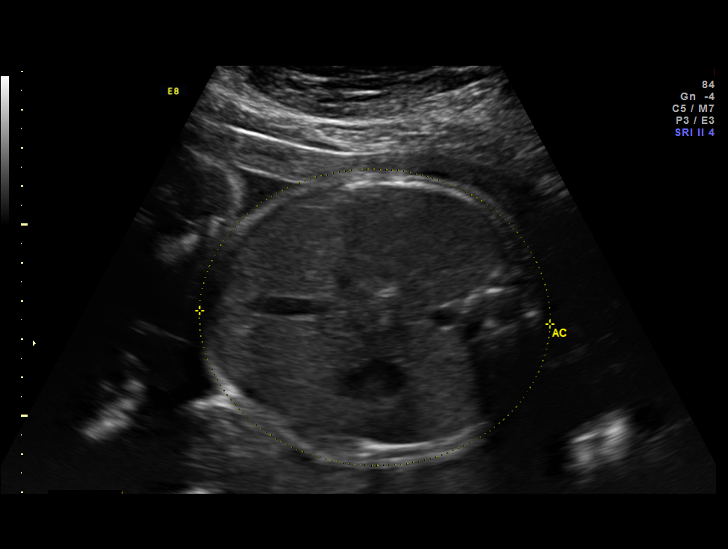
[im 37/82]
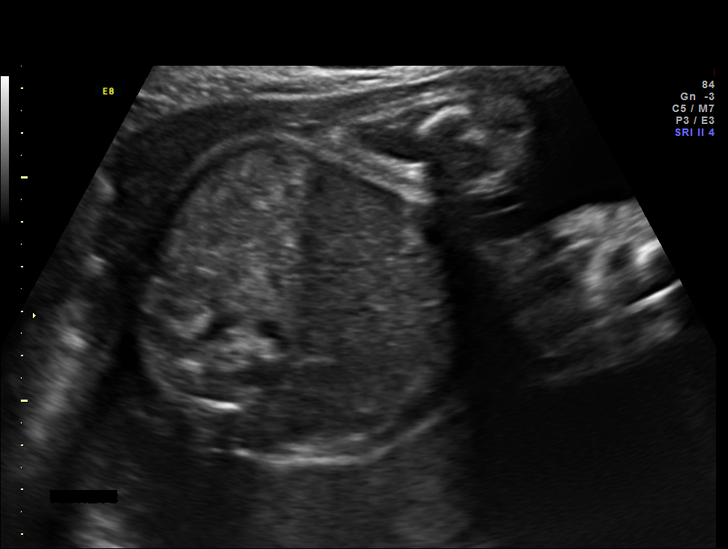
[im 46/82]
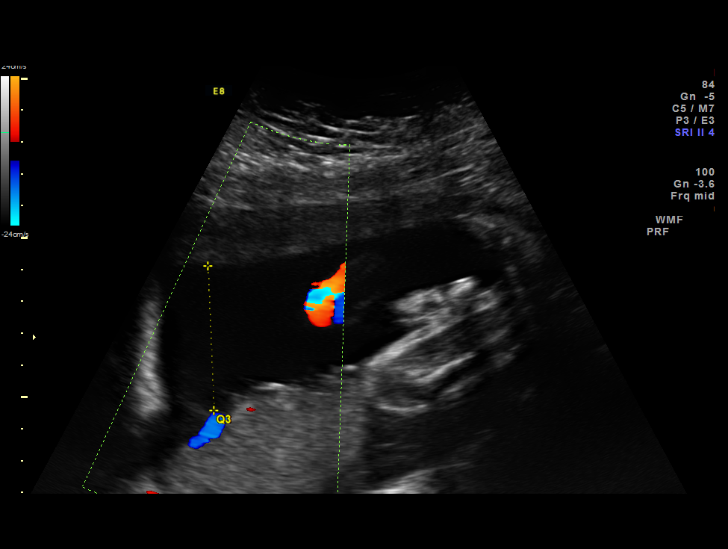
[im 52/82]
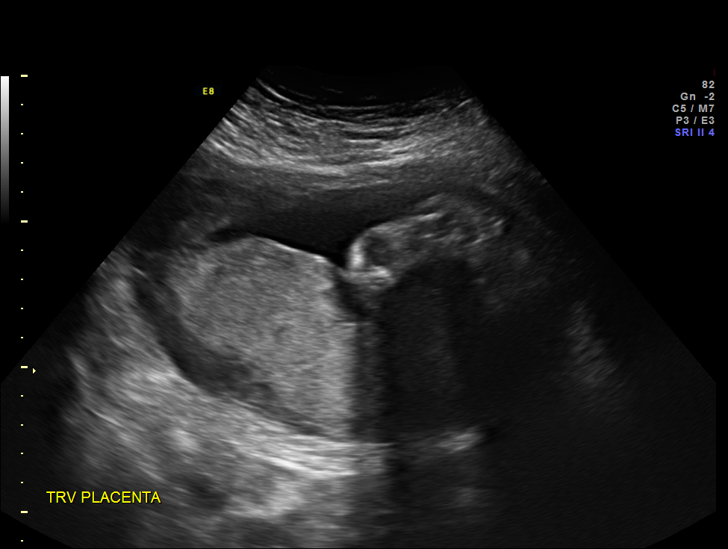
[im 58/82]
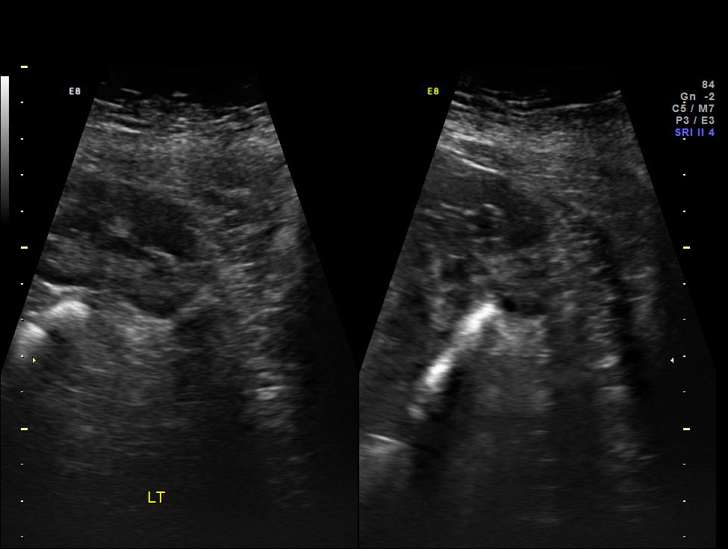
[im 67/82]
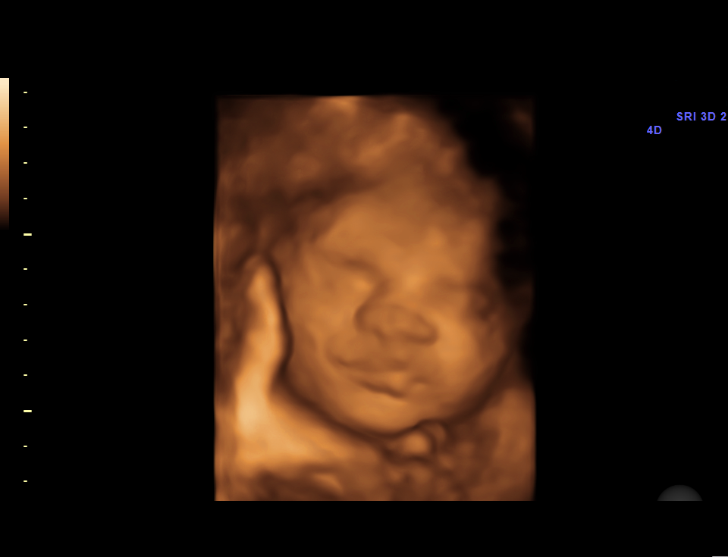
[im 73/82]
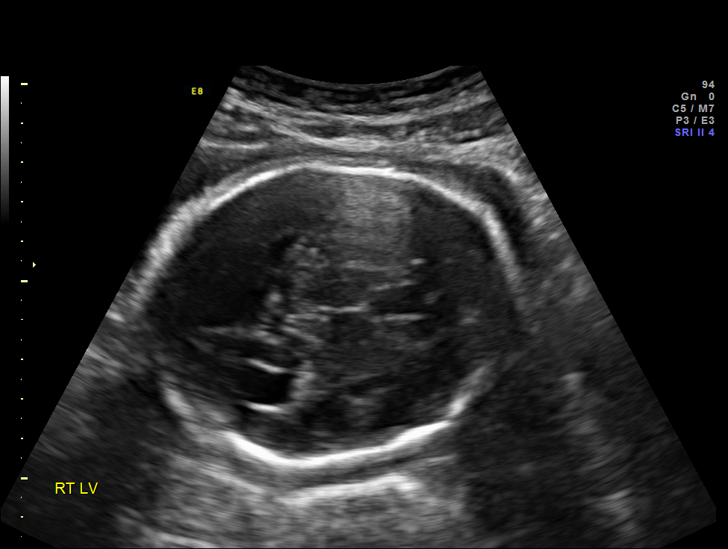
[im 79/82]
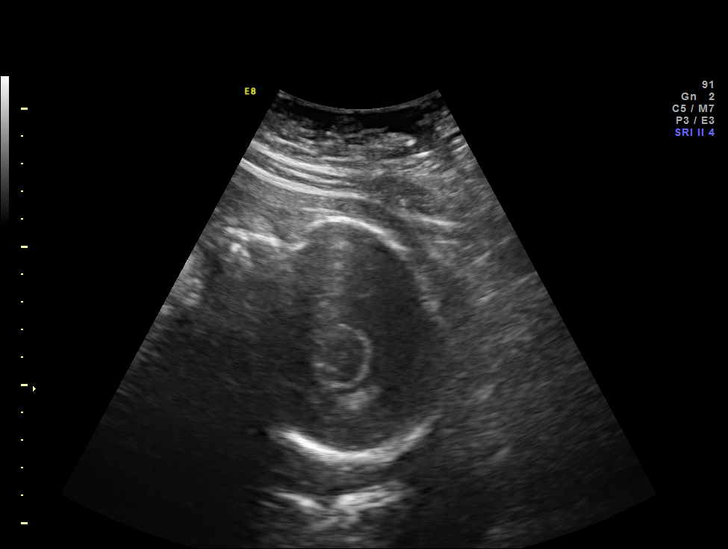

[12 of 28 positions shown; findings below may reference images not displayed]

OBSTETRICS REPORT
                      (Signed Final 04/27/2014 [DATE])

Service(s) Provided

 US OB FOLLOW UP                                       76816.1
Indications

 30 weeks gestation of pregnancy
 Follow-up incomplete fetal anatomic evaluation        Z36
 Cerebral ventriculomegaly - Bilateral
 Cervical shortening, 2nd (BMZ [DATE] & [DATE])
 Low risk NIPS/Negative Toxo
Fetal Evaluation

 Num Of Fetuses:    1
 Fetal Heart Rate:  147                          bpm
 Cardiac Activity:  Observed
 Presentation:      Cephalic
 Placenta:          Posterior, above cervical
                    os
 P. Cord            Previously Visualized
 Insertion:

 Amniotic Fluid
 AFI FV:      Subjectively within normal limits
 AFI Sum:     20.34   cm       79  %Tile     Larg Pckt:    5.71  cm
 RUQ:   5.04    cm   RLQ:    5.71   cm    LUQ:   5.05    cm   LLQ:    4.54   cm
Biometry

 BPD:     73.8  mm     G. Age:  29w 4d                CI:         74.8   70 - 86
 OFD:     98.7  mm                                    FL/HC:      21.2   19.2 -

 HC:     279.8  mm     G. Age:  30w 5d       21  %    HC/AC:      1.08   0.99 -

 AC:     259.7  mm     G. Age:  30w 1d       37  %    FL/BPD:     80.5   71 - 87
 FL:      59.4  mm     G. Age:  31w 0d       51  %    FL/AC:      22.9   20 - 24
 HUM:     54.6  mm     G. Age:  31w 5d       77  %

 Est. FW:    9955  gm      3 lb 7 oz     53  %
Gestational Age

 LMP:           30w 3d        Date:  09/26/13                 EDD:   07/03/14
 U/S Today:     30w 2d                                        EDD:   07/04/14
 Best:          30w 3d     Det. By:  LMP  (09/26/13)          EDD:   07/03/14
Anatomy

 Cranium:          Appears normal         Aortic Arch:      Appears normal
 Fetal Cavum:      Not well visualized    Ductal Arch:      Appears normal
 Ventricles:       Bil                    Diaphragm:        Appears normal
                   ventriculomegaly
 Choroid Plexus:   Appears normal         Stomach:          Appears normal, left
                                                            sided
 Cerebellum:       Appears normal         Abdomen:          Appears normal
 Posterior Fossa:  Appears normal         Abdominal Wall:   Previously seen
 Nuchal Fold:      Not applicable (>20    Cord Vessels:     Appears normal (3
                   wks GA)                                  vessel cord)
 Face:             Orbits and profile     Kidneys:          Appear normal
                   previously seen
 Lips:             Previously seen        Bladder:          Appears normal
 Heart:            Appears normal         Spine:            Previously seen
                   (4CH, axis, and
                   situs)
 RVOT:             Appears normal         Lower             Previously seen
                                          Extremities:
 LVOT:             Appears normal         Upper             Previously seen
                                          Extremities:

 Other:  Fetus appears to be a male. 5th digit and heels previously visualized.
         Nasal bone previously visualized. Technically difficult due to fetal
         position.
Cervix Uterus Adnexa

 Cervix:       Not visualized (advanced GA >96wks)
 Uterus:       No abnormality visualized.
 Cul De Sac:   No free fluid seen.

 Left Ovary:    Within normal limits.
 Right Ovary:   Within normal limits.
 Adnexa:     No abnormality visualized.
Impression

 Single IUP at 30w 3d
 Follow up due to bilateral cerebral ventriculomegaly,
 shortened cervix (stable on vaginal progesterone)
 Borderline, bilateral ventriculomegaly is again noted (1.0-1.1
 cm) - less pronounced on on previous ultrasound
 Difficult to image the CSP - appears normal on some images
 The remainder of the cranial anatomy appears normal
 Fetal growth is appropriate (53rd %tile)
 Posterior placenta
 Normal amniotic fluid volume
Recommendations

 Recommend follow-up ultrasound examination in 4 weeks for
 interval growth and reevaluation of the lateral ventricles.
 Will likely require imaging studies of the newborn after
 delivery.

## 2015-12-06 ENCOUNTER — Encounter (HOSPITAL_COMMUNITY): Payer: Self-pay | Admitting: Emergency Medicine

## 2015-12-06 ENCOUNTER — Emergency Department (HOSPITAL_COMMUNITY)
Admission: EM | Admit: 2015-12-06 | Discharge: 2015-12-06 | Disposition: A | Discharge status: Home / Self Care | Payer: Medicaid Other | Attending: Emergency Medicine | Admitting: Emergency Medicine

## 2015-12-06 ENCOUNTER — Emergency Department (HOSPITAL_COMMUNITY): Payer: Medicaid Other

## 2015-12-06 DIAGNOSIS — K2970 Gastritis, unspecified, without bleeding: Secondary | ICD-10-CM | POA: Diagnosis not present

## 2015-12-06 DIAGNOSIS — Z7984 Long term (current) use of oral hypoglycemic drugs: Secondary | ICD-10-CM | POA: Insufficient documentation

## 2015-12-06 DIAGNOSIS — Z79891 Long term (current) use of opiate analgesic: Secondary | ICD-10-CM | POA: Diagnosis not present

## 2015-12-06 DIAGNOSIS — J45909 Unspecified asthma, uncomplicated: Secondary | ICD-10-CM | POA: Insufficient documentation

## 2015-12-06 DIAGNOSIS — R079 Chest pain, unspecified: Secondary | ICD-10-CM | POA: Diagnosis present

## 2015-12-06 DIAGNOSIS — Z87891 Personal history of nicotine dependence: Secondary | ICD-10-CM | POA: Insufficient documentation

## 2015-12-06 DIAGNOSIS — E119 Type 2 diabetes mellitus without complications: Secondary | ICD-10-CM | POA: Diagnosis not present

## 2015-12-06 DIAGNOSIS — N390 Urinary tract infection, site not specified: Secondary | ICD-10-CM | POA: Diagnosis not present

## 2015-12-06 HISTORY — DX: Unspecified asthma, uncomplicated: J45.909

## 2015-12-06 HISTORY — DX: Pure hypercholesterolemia, unspecified: E78.00

## 2015-12-06 HISTORY — DX: Type 2 diabetes mellitus without complications: E11.9

## 2015-12-06 LAB — CBC
HEMATOCRIT: 42.9 % (ref 36.0–46.0)
Hemoglobin: 13.9 g/dL (ref 12.0–15.0)
MCH: 22.2 pg — ABNORMAL LOW (ref 26.0–34.0)
MCHC: 32.4 g/dL (ref 30.0–36.0)
MCV: 68.5 fL — ABNORMAL LOW (ref 78.0–100.0)
PLATELETS: 312 10*3/uL (ref 150–400)
RBC: 6.26 MIL/uL — ABNORMAL HIGH (ref 3.87–5.11)
RDW: 18 % — ABNORMAL HIGH (ref 11.5–15.5)
WBC: 13.2 10*3/uL — AB (ref 4.0–10.5)

## 2015-12-06 LAB — URINE MICROSCOPIC-ADD ON

## 2015-12-06 LAB — BASIC METABOLIC PANEL
ANION GAP: 8 (ref 5–15)
BUN: 11 mg/dL (ref 6–20)
CALCIUM: 9.5 mg/dL (ref 8.9–10.3)
CO2: 26 mmol/L (ref 22–32)
Chloride: 103 mmol/L (ref 101–111)
Creatinine, Ser: 1.07 mg/dL — ABNORMAL HIGH (ref 0.44–1.00)
Glucose, Bld: 90 mg/dL (ref 65–99)
Potassium: 3.9 mmol/L (ref 3.5–5.1)
Sodium: 137 mmol/L (ref 135–145)

## 2015-12-06 LAB — URINALYSIS, ROUTINE W REFLEX MICROSCOPIC
BILIRUBIN URINE: NEGATIVE
Glucose, UA: NEGATIVE mg/dL
KETONES UR: NEGATIVE mg/dL
NITRITE: NEGATIVE
Protein, ur: NEGATIVE mg/dL
Specific Gravity, Urine: 1.025 (ref 1.005–1.030)
pH: 5.5 (ref 5.0–8.0)

## 2015-12-06 LAB — TROPONIN I

## 2015-12-06 LAB — POC URINE PREG, ED: PREG TEST UR: NEGATIVE

## 2015-12-06 MED ORDER — PANTOPRAZOLE SODIUM 40 MG PO TBEC: 40.0000 mg | DELAYED_RELEASE_TABLET | Freq: Every day | ORAL | 0 refills | Status: DC

## 2015-12-06 MED ORDER — CEPHALEXIN 500 MG PO CAPS: 500.0000 mg | ORAL_CAPSULE | Freq: Two times a day (BID) | ORAL | 0 refills | Status: DC

## 2015-12-06 NOTE — Discharge Instructions (Addendum)
I think you likely have gastritis. I will provide you with protonix to take for the next couple of weeks. If you are still have abdominal pain, please follow up with your primary care physician to consider ultrasound of gallbladder as this may also be the reason for your abdominal pain. Your urine is significant for possibly UTI, therefore I am giving you 7 days of antibiotics.

## 2015-12-06 NOTE — ED Triage Notes (Signed)
Hour after eating lunch Sunday. Stomach started tightening up an she vomited. She was back and forward to the bathroom all day Monday. Vomiting all day today.  "Chest tightening"  Pt states she gets short of breathe. Pt is speaking in full and complete sentences. Pt was able to ambulate to triage without difficulty.

## 2015-12-06 NOTE — ED Provider Notes (Signed)
[facility]-EMERGENCY DEPT Provider Note   CSN: [id number] Arrival date & time: 12/06/15  1605     History   Chief Complaint Chief Complaint  Patient presents with  . Chest Pain    HPI Lindsay James is a 25 y.o. female with pmhx of Migraines presenting with chest pain. Patient states she has been cramping epigastric and LUQ pain starting Sunday after eating greasy food. Patient vomited x 1 on Sunday. Patient started having chest pain the afternoon, which is sharp/burning sensation. Chest pain was a sharp pain, lasted for several hours.  Chest pain was not pleuritic. Chest pain improves with movement, worse with rest. Not worse with change in position.  Chest pain has since resolved. Last night patient also had a Migraine, which she has a hx of migraines. She states she vomited x 3 since 1 AM, states that she has had vomiting in the past associated with her migraines. Migraines has now resolved.  Patient denies any fevers or chills. Patient denies any diarrhea, indicates has been constipated for about 1 day. Patient endorse dysuria, denies increased freq or CVA tenderness. Patient indicates being sexual active, denies vaginal discharge or irritation. Nexaplanon in place, year 2.   HPI  Past Medical History:  Diagnosis Date  . Asthma   . Diabetes mellitus without complication (HCC)   . High cholesterol   . Migraine     Patient Active Problem List   Diagnosis Date Noted  . [redacted] weeks gestation of pregnancy   . Cerebral ventriculomegaly of fetus   . Cervical insufficiency during pregnancy in second trimester, antepartum   . [redacted] weeks gestation of pregnancy     No past surgical history on file.  OB History    Gravida Para Term Preterm AB Living   1             SAB TAB Ectopic Multiple Live Births                   Home Medications    Prior to Admission medications   Medication Sig Start Date End Date Taking? Authorizing Provider  acetaminophen (TYLENOL) 325 MG tablet Take by  mouth daily as needed for mild pain, moderate pain or headache.    Historical Provider, MD  cephALEXin (KEFLEX) 500 MG capsule Take 1 capsule (500 mg total) by mouth 4 (four) times daily. Patient not taking: Reported on 04/27/2014 03/24/14   Mancel Bale, MD  prenatal vitamin w/FE, FA (PRENATAL 1 + 1) 27-1 MG TABS tablet Take 1 tablet by mouth daily at 12 noon.    Historical Provider, MD  PRESCRIPTION MEDICATION Place 1 suppository vaginally at bedtime.    Historical Provider, MD    Family History No family history on file.  Social History Social History  Substance Use Topics  . Smoking status: Former Smoker    Quit date: 10/22/2013  . Smokeless tobacco: Not on file  . Alcohol use No     Comment: occ     Allergies   Review of patient's allergies indicates no known allergies.   Review of Systems Review of Systems  Constitutional: Negative for chills and fever.  HENT: Negative for congestion.   Respiratory: Negative for cough and chest tightness.   Cardiovascular: Positive for chest pain.  Gastrointestinal: Positive for abdominal pain and vomiting. Negative for nausea.  Genitourinary: Positive for dysuria. Negative for flank pain and frequency.  Musculoskeletal: Negative for back pain and myalgias.  Neurological: Positive for headaches. Negative for dizziness.  Physical Exam Updated Vital Signs BP 128/75 (BP Location: Left Arm)   Pulse 72   Temp 99.1 F (37.3 C) (Oral)   Resp 20   Ht 5\' 4"  (1.626 m)   Wt 122.5 kg   LMP  (Within Years)   SpO2 100%   BMI 46.35 kg/m   Physical Exam  Constitutional: She is oriented to person, place, and time. She appears well-developed and well-nourished.  HENT:  Head: Normocephalic and atraumatic.  Eyes: EOM are normal. Pupils are equal, round, and reactive to light.  Neck: Normal range of motion. Neck supple.  Cardiovascular: Normal rate, regular rhythm and normal heart sounds.   Pulmonary/Chest: Effort normal and breath sounds  normal.  Abdominal: Soft. Bowel sounds are normal.  Musculoskeletal: Normal range of motion.  Neurological: She is alert and oriented to person, place, and time.  Skin: Skin is warm and dry.     ED Treatments / Results  Labs (all labs ordered are listed, but only abnormal results are displayed) Labs Reviewed  CBC - Abnormal; Notable for the following:       Result Value   WBC 13.2 (*)    RBC 6.26 (*)    MCV 68.5 (*)    MCH 22.2 (*)    RDW 18.0 (*)    All other components within normal limits  BASIC METABOLIC PANEL  TROPONIN I    EKG  EKG Interpretation None       Radiology Dg Chest 2 View  Result Date: 12/06/2015 CLINICAL DATA:  Chest pain, dyspnea, vomiting for 2 days. EXAM: CHEST  2 VIEW COMPARISON:  05/09/2003 FINDINGS: The heart size and mediastinal contours are within normal limits. Both lungs are clear. The visualized skeletal structures are unremarkable. IMPRESSION: Negative.  No active cardiopulmonary disease. Electronically Signed   By: Myles RosenthalJohn  Stahl M.D.   On: 12/06/2015 16:54    Procedures Procedures (including critical care time)  Medications Ordered in ED Medications - No data to display   Initial Impression / Assessment and Plan / ED Course  I have reviewed the triage vital signs and the nursing notes.  Pertinent labs & imaging results that were available during my care of the patient were reviewed by me and considered in my medical decision making (see chart for details).  Clinical Course    Patient presenting with multiple symptoms including chest pain abdominal pain, dysuria, nausea, vomiting, headaches. On evaluation patient denied having any of these symptoms. She indicated having abdominal pain with vomiting which is epigastric and left upper quadrant, however on exam she had no tenderness to palpation. Patient also indicated having dysuria with normal vitals and no flank pain. Patient's UA was positive for many white blood cells, therefore will  treat her UTI with Keflex. She states that she has nausea and vomiting associated with migraines and was having a migraine last night. Indicates that she did have chest pain however this is not concerning for cardiac cause, EKG was negative. However patient did imply an of epigastric pain and a burning sensation therefore concerned that this was gastritis provided patient with a PPI. Concern that abdominal pain could be associated with gallbladder and patient had an episode of biliary colic indicated that she should follow-up with PCP if this returns for an ultrasound of her gallbladder.  Final Clinical Impressions(s) / ED Diagnoses   Final diagnoses:  None    New Prescriptions New Prescriptions   No medications on file     Wrenn Willcox Mayra ReelZahra Ethne Jeon, MD  12/07/15 0018    Blane Ohara, MD 12/09/15 1426

## 2015-12-08 LAB — URINE CULTURE

## 2017-01-10 ENCOUNTER — Encounter (HOSPITAL_COMMUNITY): Payer: Self-pay | Admitting: Emergency Medicine

## 2017-01-10 ENCOUNTER — Emergency Department (HOSPITAL_COMMUNITY)
Admission: EM | Admit: 2017-01-10 | Discharge: 2017-01-10 | Disposition: A | Discharge status: Home / Self Care | Payer: No Typology Code available for payment source | Attending: Emergency Medicine | Admitting: Emergency Medicine

## 2017-01-10 DIAGNOSIS — F17210 Nicotine dependence, cigarettes, uncomplicated: Secondary | ICD-10-CM | POA: Diagnosis not present

## 2017-01-10 DIAGNOSIS — R69 Illness, unspecified: Secondary | ICD-10-CM

## 2017-01-10 DIAGNOSIS — E119 Type 2 diabetes mellitus without complications: Secondary | ICD-10-CM | POA: Insufficient documentation

## 2017-01-10 DIAGNOSIS — J45909 Unspecified asthma, uncomplicated: Secondary | ICD-10-CM | POA: Diagnosis not present

## 2017-01-10 DIAGNOSIS — Z79899 Other long term (current) drug therapy: Secondary | ICD-10-CM | POA: Insufficient documentation

## 2017-01-10 DIAGNOSIS — M791 Myalgia, unspecified site: Secondary | ICD-10-CM | POA: Diagnosis present

## 2017-01-10 DIAGNOSIS — B349 Viral infection, unspecified: Secondary | ICD-10-CM | POA: Diagnosis not present

## 2017-01-10 DIAGNOSIS — J111 Influenza due to unidentified influenza virus with other respiratory manifestations: Secondary | ICD-10-CM

## 2017-01-10 NOTE — ED Provider Notes (Signed)
[facility] EMERGENCY DEPARTMENT Provider Note   CSN: [id number] Arrival date & time: 01/10/17  1539     History   Chief Complaint Chief Complaint  Patient presents with  . Generalized Body Aches    HPI Daleen BoSierra B Mahar is a 26 y.o. female.  The history is provided by the patient. No language interpreter was used.  URI   This is a new problem. The current episode started more than 1 week ago. The problem has not changed since onset.There has been no fever. Associated symptoms include congestion. She has tried nothing for the symptoms. The treatment provided no relief.    Past Medical History:  Diagnosis Date  . Asthma   . Diabetes mellitus without complication (HCC)   . High cholesterol   . Migraine     Patient Active Problem List   Diagnosis Date Noted  . [redacted] weeks gestation of pregnancy   . Cerebral ventriculomegaly of fetus   . Cervical insufficiency during pregnancy in second trimester, antepartum   . [redacted] weeks gestation of pregnancy     History reviewed. No pertinent surgical history.  OB History    Gravida Para Term Preterm AB Living   1             SAB TAB Ectopic Multiple Live Births                   Home Medications    Prior to Admission medications   Medication Sig Start Date End Date Taking? Authorizing Provider  acetaminophen (TYLENOL) 325 MG tablet Take 325-650 mg by mouth daily as needed for mild pain, moderate pain or headache.     [provider]  cephALEXin (KEFLEX) 500 MG capsule Take 1 capsule (500 mg total) by mouth 2 (two) times daily. 12/06/15   Mikell, Antionette PolesAsiyah Zahra, MD  gabapentin (NEURONTIN) 300 MG capsule Take 300 mg by mouth 3 (three) times daily.    [provider]  pantoprazole (PROTONIX) 40 MG tablet Take 1 tablet (40 mg total) by mouth daily. 12/06/15   Mikell, Antionette PolesAsiyah Zahra, MD  UNKNOWN TO PATIENT Take 1 tablet by mouth at bedtime. CHOLESTEROL    [provider]    Family History No family history  on file.  Social History Social History  Substance Use Topics  . Smoking status: Current Some Day Smoker    Types: Cigarettes    Last attempt to quit: 01/04/2017  . Smokeless tobacco: Never Used  . Alcohol use No     Comment: occ     Allergies   Peanut-containing drug products   Review of Systems Review of Systems  HENT: Positive for congestion.   All other systems reviewed and are negative.    Physical Exam Updated Vital Signs BP 129/67 (BP Location: Right Arm)   Pulse 76   Temp 98.8 F (37.1 C) (Oral)   Resp 18   Ht 5\' 4"  (1.626 m)   Wt (!) 142.9 kg (315 lb)   LMP 11/27/2016   SpO2 100%   BMI 54.07 kg/m   Physical Exam  Constitutional: She appears well-developed and well-nourished. No distress.  HENT:  Head: Normocephalic and atraumatic.  Right Ear: External ear normal.  Left Ear: External ear normal.  Eyes: Conjunctivae are normal.  Neck: Neck supple.  Cardiovascular: Normal rate and regular rhythm.   No murmur heard. Pulmonary/Chest: Effort normal and breath sounds normal. No respiratory distress.  Abdominal: Soft. There is no tenderness.  Musculoskeletal: She exhibits no  edema.  Neurological: She is alert.  Skin: Skin is warm and dry.  Psychiatric: She has a normal mood and affect.  Nursing note and vitals reviewed.    ED Treatments / Results  Labs (all labs ordered are listed, but only abnormal results are displayed) Labs Reviewed - No data to display  EKG  EKG Interpretation None       Radiology No results found.  Procedures Procedures (including critical care time)  Medications Ordered in ED Medications - No data to display   Initial Impression / Assessment and Plan / ED Course  I have reviewed the triage vital signs and the nursing notes.  Pertinent labs & imaging results that were available during my care of the patient were reviewed by me and considered in my medical decision making (see chart for details).     I  suspect viral illness, resolving.   Pt advised to take tylenol or ibuprofen.      Final Clinical Impressions(s) / ED Diagnoses   Final diagnoses:  Influenza-like illness  Viral illness    New Prescriptions Discharge Medication List as of 01/10/2017  5:04 PM    An After Visit Summary was printed and given to the patient. No orders of the defined types were placed in this encounter.    [name] 01/10/17 2156    Bethann Berkshire, MD 01/10/17 2203

## 2017-01-10 NOTE — ED Notes (Signed)
Pt reports generalized body aches for approx 5 days. Began running a fever yesterday and has loss of taste. Using Tylenol and Motrin at home with fever reduction, has not tried any cold/flu OTC medications.

## 2017-01-10 NOTE — ED Triage Notes (Signed)
Patient complains of fever body aches x 5 days. Denies nausea, diarrhea, vomiting.

## 2017-03-31 ENCOUNTER — Emergency Department (HOSPITAL_COMMUNITY)
Admission: EM | Admit: 2017-03-31 | Discharge: 2017-03-31 | Disposition: A | Discharge status: Home / Self Care | Payer: 59 | Attending: Emergency Medicine | Admitting: Emergency Medicine

## 2017-03-31 ENCOUNTER — Other Ambulatory Visit: Payer: Self-pay

## 2017-03-31 ENCOUNTER — Encounter (HOSPITAL_COMMUNITY): Payer: Self-pay | Admitting: Emergency Medicine

## 2017-03-31 DIAGNOSIS — J45909 Unspecified asthma, uncomplicated: Secondary | ICD-10-CM | POA: Insufficient documentation

## 2017-03-31 DIAGNOSIS — Z91010 Allergy to peanuts: Secondary | ICD-10-CM | POA: Insufficient documentation

## 2017-03-31 DIAGNOSIS — E119 Type 2 diabetes mellitus without complications: Secondary | ICD-10-CM | POA: Insufficient documentation

## 2017-03-31 DIAGNOSIS — R51 Headache: Secondary | ICD-10-CM | POA: Diagnosis present

## 2017-03-31 DIAGNOSIS — Z79899 Other long term (current) drug therapy: Secondary | ICD-10-CM | POA: Diagnosis not present

## 2017-03-31 DIAGNOSIS — F17210 Nicotine dependence, cigarettes, uncomplicated: Secondary | ICD-10-CM | POA: Diagnosis not present

## 2017-03-31 DIAGNOSIS — R519 Headache, unspecified: Secondary | ICD-10-CM

## 2017-03-31 MED ORDER — KETOROLAC TROMETHAMINE 60 MG/2ML IM SOLN
60.0000 mg | Freq: Once | INTRAMUSCULAR | Status: AC
  Administered 2017-03-31: 60 mg via INTRAMUSCULAR
  Filled 2017-03-31: qty 2

## 2017-03-31 MED ORDER — DIPHENHYDRAMINE HCL 12.5 MG/5ML PO ELIX
12.5000 mg | ORAL_SOLUTION | Freq: Once | ORAL | Status: AC
  Administered 2017-03-31: 12.5 mg via ORAL
  Filled 2017-03-31: qty 5

## 2017-03-31 MED ORDER — PROMETHAZINE HCL 12.5 MG PO TABS
25.0000 mg | ORAL_TABLET | Freq: Once | ORAL | Status: AC
Start: 2017-03-31 — End: 2017-03-31
  Administered 2017-03-31: 25 mg via ORAL
  Filled 2017-03-31: qty 2

## 2017-03-31 NOTE — ED Provider Notes (Signed)
[facility] EMERGENCY DEPARTMENT Provider Note   CSN: [id number] Arrival date & time: 03/31/17  1634     History   Chief Complaint Chief Complaint  Patient presents with  . Migraine    HPI Lindsay James is a 27 y.o. female.  Patient is a 27 year old female who presents to the emergency department with a complaint of headache.  Patient states she has a history of migraine headaches.  These have been going on since her teen years.  She states this headache feels similar to her previous headaches.  This headache started approximately 330 this morning.  She thought that maybe it was related to being hungry so she ate a snack, went to lay down.  She got up around 6 AM and continued to have headache.  After the 6 AM episode she noticed that she had some photophobia.  She states that the headache starts in her right temple goes to her forehead and then into her left temple area.  She has not had vomiting to be reported, there has been some nausea.  There is been no injury to the head.  Is been no fever or chills reported.  No change in diet, no change in exercise, and no change in medications.  The patient presents now for assistance with this headache.      Past Medical History:  Diagnosis Date  . Asthma   . Diabetes mellitus without complication (HCC)   . High cholesterol   . Migraine     Patient Active Problem List   Diagnosis Date Noted  . [redacted] weeks gestation of pregnancy   . Cerebral ventriculomegaly of fetus   . Cervical insufficiency during pregnancy in second trimester, antepartum   . [redacted] weeks gestation of pregnancy     History reviewed. No pertinent surgical history.  OB History    Gravida Para Term Preterm AB Living   1             SAB TAB Ectopic Multiple Live Births                   Home Medications    Prior to Admission medications   Medication Sig Start Date End Date Taking? Authorizing Provider  acetaminophen (TYLENOL) 325 MG tablet Take 325-650  mg by mouth daily as needed for mild pain, moderate pain or headache.    Yes [provider]  gabapentin (NEURONTIN) 300 MG capsule Take 300 mg by mouth 3 (three) times daily.   Yes [provider]  ibuprofen (ADVIL,MOTRIN) 200 MG tablet Take 400 mg by mouth every 6 (six) hours as needed for headache or cramping.   Yes [provider]    Family History No family history on file.  Social History Social History   Tobacco Use  . Smoking status: Current Some Day Smoker    Types: Cigarettes    Last attempt to quit: 01/04/2017    Years since quitting: 0.2  . Smokeless tobacco: Never Used  Substance Use Topics  . Alcohol use: No  . Drug use: No     Allergies   Peanut-containing drug products   Review of Systems Review of Systems  Constitutional: Negative for activity change.       All ROS Neg except as noted in HPI  HENT: Negative for nosebleeds.   Eyes: Negative for photophobia and discharge.  Respiratory: Negative for cough, shortness of breath and wheezing.   Cardiovascular: Negative for chest pain and palpitations.  Gastrointestinal: Positive  for nausea. Negative for abdominal pain and blood in stool.  Genitourinary: Negative for dysuria, frequency and hematuria.  Musculoskeletal: Negative for arthralgias, back pain and neck pain.  Skin: Negative.   Neurological: Positive for headaches. Negative for dizziness, seizures and speech difficulty.  Psychiatric/Behavioral: Negative for confusion and hallucinations.     Physical Exam Updated Vital Signs BP 137/75   Pulse 77   Temp 98.7 F (37.1 C) (Oral)   Resp 15   Ht 5\' 4"  (1.626 m)   Wt (!) 149.7 kg (330 lb)   LMP 03/24/2017   SpO2 100%   BMI 56.64 kg/m   Physical Exam  Constitutional: She appears well-developed and well-nourished. No distress.  HENT:  Head: Normocephalic and atraumatic.  Right Ear: External ear normal.  Left Ear: External ear normal.  Eyes: Conjunctivae are normal.  Right eye exhibits no discharge. Left eye exhibits no discharge. No scleral icterus.  Neck: Neck supple. No tracheal deviation present.  Cardiovascular: Normal rate, regular rhythm and intact distal pulses.  Pulmonary/Chest: Effort normal and breath sounds normal. No stridor. No respiratory distress. She has no wheezes. She has no rales.  Abdominal: Soft. Bowel sounds are normal. She exhibits no distension. There is no tenderness. There is no rebound and no guarding.  Musculoskeletal: She exhibits no edema or tenderness.  Neurological: She is alert. She has normal strength. No cranial nerve deficit (no facial droop, extraocular movements intact, no slurred speech) or sensory deficit. She exhibits normal muscle tone. She displays no seizure activity. Coordination normal.  Skin: Skin is warm and dry. No rash noted.  Psychiatric: She has a normal mood and affect.  Nursing note and vitals reviewed.    ED Treatments / Results  Labs (all labs ordered are listed, but only abnormal results are displayed) Labs Reviewed - No data to display  EKG  EKG Interpretation None       Radiology No results found.  Procedures Procedures (including critical care time)  Medications Ordered in ED Medications  promethazine (PHENERGAN) tablet 25 mg (not administered)  ketorolac (TORADOL) injection 60 mg (not administered)  diphenhydrAMINE (BENADRYL) 12.5 MG/5ML elixir 12.5 mg (not administered)     Initial Impression / Assessment and Plan / ED Course  I have reviewed the triage vital signs and the nursing notes.  Pertinent labs & imaging results that were available during my care of the patient were reviewed by me and considered in my medical decision making (see chart for details).        Final Clinical Impressions(s) / ED Diagnoses MDM Vital signs reviewed.  Pulse oximetry is 99% on room air.  Within normal limits by my interpretation.  No gross neurologic deficits appreciated at this  time.  In particular there is no evidence in the history or the examination to suggest acute changes such as subarachnoid hemorrhage or intracranial hemorrhage or abnormality.  There is no nuchal rigidity or signs of meningitis.  There is no vision changes reported.  Patient treated in the emergency department with a headache cocktail.  Recheck.  Patient states she has a little bit of headache left, but for the most part her headache has resolved.  No changes in neurologic examination at this time.  Gait is steady and intact.  Patient will be referred to [facility] for additional evaluation and management of her headaches.     Final diagnoses:  Bad headache    ED Discharge Orders    None  Ivery Quale, PA-C 03/31/17 1852    Samuel Jester, DO 04/02/17 [id number]

## 2017-03-31 NOTE — Discharge Instructions (Signed)
Your vital signs within normal limits.  Your neurologic examination is negative for acute changes or problems.  You were treated today with a headache cocktail.  This medication may cause drowsiness.  Please use caution getting around today.  Please call Dr. Neale BurlyFreeman, or member of his team at the headache wellness center to establish an appointment concerning her headaches.  Please return to the emergency department if any changes, problems, or concerns.

## 2017-03-31 NOTE — ED Triage Notes (Addendum)
Patient c/o migraine headache that started this morning. Patient reports nausea with sensitivity to light and sound. Hx of migraine headaches. Patient denies taking anything for headache. Denies any neurological deficits.

## 2017-10-10 ENCOUNTER — Encounter (HOSPITAL_COMMUNITY): Payer: Self-pay

## 2017-10-10 ENCOUNTER — Emergency Department (HOSPITAL_COMMUNITY)
Admission: EM | Admit: 2017-10-10 | Discharge: 2017-10-10 | Disposition: A | Discharge status: Home / Self Care | Payer: 59 | Attending: Emergency Medicine | Admitting: Emergency Medicine

## 2017-10-10 ENCOUNTER — Other Ambulatory Visit: Payer: Self-pay

## 2017-10-10 ENCOUNTER — Emergency Department (HOSPITAL_COMMUNITY): Payer: 59

## 2017-10-10 DIAGNOSIS — Z3202 Encounter for pregnancy test, result negative: Secondary | ICD-10-CM | POA: Insufficient documentation

## 2017-10-10 DIAGNOSIS — M549 Dorsalgia, unspecified: Secondary | ICD-10-CM

## 2017-10-10 DIAGNOSIS — F17210 Nicotine dependence, cigarettes, uncomplicated: Secondary | ICD-10-CM | POA: Diagnosis not present

## 2017-10-10 DIAGNOSIS — J45909 Unspecified asthma, uncomplicated: Secondary | ICD-10-CM | POA: Diagnosis not present

## 2017-10-10 DIAGNOSIS — Z91010 Allergy to peanuts: Secondary | ICD-10-CM | POA: Diagnosis not present

## 2017-10-10 DIAGNOSIS — E119 Type 2 diabetes mellitus without complications: Secondary | ICD-10-CM | POA: Insufficient documentation

## 2017-10-10 DIAGNOSIS — M25562 Pain in left knee: Secondary | ICD-10-CM

## 2017-10-10 DIAGNOSIS — Z79899 Other long term (current) drug therapy: Secondary | ICD-10-CM | POA: Insufficient documentation

## 2017-10-10 DIAGNOSIS — M545 Low back pain: Secondary | ICD-10-CM | POA: Insufficient documentation

## 2017-10-10 DIAGNOSIS — M79671 Pain in right foot: Secondary | ICD-10-CM | POA: Insufficient documentation

## 2017-10-10 LAB — POC URINE PREG, ED: Preg Test, Ur: NEGATIVE

## 2017-10-10 LAB — CBG MONITORING, ED: Glucose-Capillary: 101 mg/dL — ABNORMAL HIGH (ref 70–99)

## 2017-10-10 MED ORDER — KETOROLAC TROMETHAMINE 30 MG/ML IJ SOLN
30.0000 mg | Freq: Once | INTRAMUSCULAR | Status: AC
  Administered 2017-10-10: 30 mg via INTRAMUSCULAR
  Filled 2017-10-10: qty 1

## 2017-10-10 MED ORDER — NAPROXEN 500 MG PO TABS: 500.0000 mg | ORAL_TABLET | Freq: Two times a day (BID) | ORAL | 0 refills | Status: DC

## 2017-10-10 NOTE — Discharge Instructions (Addendum)
Your x-rays are reassuring.  Please use Naprosyn and Tylenol as we discussed, I would also like for you to try over-the-counter salon pas lidocaine patches for your knee and back.  Please use crutches and knee immobilizer and try and rest your knee the next few days elevate and apply ice as much as possible.  If your pain is not improving with these measures please follow-up with your primary care doctor and/or orthopedics.  Return to the emergency department for significantly worsening pain, redness or swelling over your joints, fevers, numbness or weakness in your lower extremities, loss of bowel or bladder control or any other new or concerning symptoms.   [facility] Primary [facility] Doctor List    Kari BaarsEdward Hawkins MD. Specialty: Pulmonary Disease Contact information: [address]  [address]  [place] [address]  [phone]   Syliva OvermanMargaret Simpson, MD. Specialty: [facility] Contact information: [address]  [place] [address]  [phone]   Lilyan PuntScott Luking, MD. Specialty: [facility] Contact information: [address]  [address]  [place] [address]  [phone]   Avon Gullyesfaye Fanta, MD Specialty: Internal Medicine Contact information: [address]  [place] [address]  [phone]   Catalina PizzaZach Hall, MD. Specialty: Internal Medicine Contact information: [address]  [place] [address]  [phone]    [facility] (Dr. Selena BattenKim) Specialty: Family Medicine Contact information: [address]  [place] [address]  [phone]   John GiovanniStephen Knowlton, MD. Specialty: [facility] Contact information: [address]  [address]  [place] [address]  [phone]   Carylon Perchesoy Fagan, MD. Specialty: Internal Medicine Contact information: [address]  [address]  [place] [address]  [phone]    [facility]  [address] [place] [address] [phone]  Services [facility] offers a variety of basic health services.  Services include but are not limited to: Blood pressure checks  Heart rate checks  Blood sugar checks  Urine analysis  Rapid strep tests  Pregnancy tests.  Health education and referrals  People needing more complex services will be directed to a physician online. Using these virtual visits, doctors can evaluate and prescribe medicine and treatments. There will be no medication on-site, though [facility] will help patients fill their prescriptions at little to no cost.   For More information please go to: [email or web address]

## 2017-10-10 NOTE — ED Triage Notes (Signed)
Pt states she is having left knee pain that started 3 weeks ago. Pt is ambulatory. Is not sure what she did to it. Pt also having back pain. Pt had an epidural 3 years ago and is afraid back pain has something to do with that. Pt is also having bilateral foot pain.

## 2017-10-10 NOTE — ED Provider Notes (Signed)
[facility] EMERGENCY DEPARTMENT Provider Note   CSN: [id number] Arrival date & time: 10/10/17  0911     History   Chief Complaint Chief Complaint  Patient presents with  . Knee Pain  . Generalized Body Aches    HPI Lindsay James is a 27 y.o. female.  Lindsay James is a 27 y.o. Female with a history of asthma, hyperlipidemia, migraines and prediabetes, who presents to the emergency department for evaluation of left knee pain, low back pain and right foot pain.  She reports left knee pain has been present for the past 3 weeks and she thinks she may have hurt her knee at work, she works at [employer] and is on her feet for several hours a day and sometimes has to do heavy lifting.  She reports her left knee has felt intermittently swollen, but has not been red or hot.  She reports having midline low back pain intermittently for the past 3 years ever since she had her son, she is also had intermittent pain on the bottom of her right foot, which she describes as it feels as though she is stepping on needles.  She saw her primary care doctor regarding this 3 years ago and was told she may have some nerve damage and was initially treated with gabapentin, but has stopped taking this regularly because she feels it makes her drowsy.  She just tried to Tylenol and ibuprofen for her pain without relief, has not tried anything else to treat her symptoms.  She denies any loss of bowel or bladder control, no saddle anesthesia or numbness or weakness in her legs.  She denies abdominal pain, nausea, vomiting.  No fevers or chills, no urinary symptoms.  No history of cancer or IV drug use.      Past Medical History:  Diagnosis Date  . Asthma   . Diabetes mellitus without complication (HCC)   . High cholesterol   . Migraine     Patient Active Problem List   Diagnosis Date Noted  . [redacted] weeks gestation of pregnancy   . Cerebral ventriculomegaly of fetus   . Cervical insufficiency during  pregnancy in second trimester, antepartum   . [redacted] weeks gestation of pregnancy     History reviewed. No pertinent surgical history.   OB History    Gravida  1   Para      Term      Preterm      AB      Living        SAB      TAB      Ectopic      Multiple      Live Births               Home Medications    Prior to Admission medications   Medication Sig Start Date End Date Taking? Authorizing Provider  acetaminophen (TYLENOL) 325 MG tablet Take 325-650 mg by mouth daily as needed for mild pain, moderate pain or headache.     [provider]  gabapentin (NEURONTIN) 300 MG capsule Take 300 mg by mouth 3 (three) times daily.    [provider]  ibuprofen (ADVIL,MOTRIN) 200 MG tablet Take 400 mg by mouth every 6 (six) hours as needed for headache or cramping.    [provider]    Family History No family history on file.  Social History Social History   Tobacco Use  . Smoking status: Current Some Day Smoker  Types: Cigarettes    Last attempt to quit: 01/04/2017    Years since quitting: 0.7  . Smokeless tobacco: Never Used  Substance Use Topics  . Alcohol use: No  . Drug use: No     Allergies   Peanut-containing drug products   Review of Systems Review of Systems  Constitutional: Negative for chills and fever.  HENT: Negative.   Respiratory: Negative for cough and shortness of breath.   Cardiovascular: Negative for chest pain.  Gastrointestinal: Negative for abdominal pain, constipation, nausea and vomiting.  Genitourinary: Negative for difficulty urinating, dysuria, frequency, vaginal bleeding and vaginal discharge.  Musculoskeletal: Positive for arthralgias, back pain, joint swelling and myalgias. Negative for gait problem, neck pain and neck stiffness.  Skin: Negative for color change, rash and wound.  Neurological: Negative for weakness and numbness.     Physical Exam Updated Vital Signs BP (!) 121/58 (BP  Location: Left Arm)   Pulse 85   Temp 98 F (36.7 C) (Oral)   Resp 14   Ht 5\' 4"  (1.626 m)   Wt (!) 144.7 kg (319 lb)   LMP 09/10/2017   SpO2 97%   BMI 54.76 kg/m   Physical Exam  Constitutional: She is oriented to person, place, and time. She appears well-developed and well-nourished. No distress.  HENT:  Head: Normocephalic and atraumatic.  Eyes: Right eye exhibits no discharge. Left eye exhibits no discharge.  Cardiovascular: Normal rate, regular rhythm, normal heart sounds and intact distal pulses.  Pulmonary/Chest: Effort normal and breath sounds normal. No respiratory distress.  Respirations equal and unlabored, patient able to speak in full sentences, lungs clear to auscultation bilaterally  Abdominal: Soft. Bowel sounds are normal. She exhibits no distension. There is no tenderness.  Abdomen soft, nondistended, nontender to palpation in all quadrants without guarding or peritoneal signs  Musculoskeletal:  Tenderness to palpation over the anterior left knee, no appreciable deformity, no overlying erythema, warmth or appreciable swelling or joint effusion.  Patient is able to flex and extend the knee greater than 90 degrees, and has good strength with flexion and extension.  2+ DP and TP pulses in bilateral lower extremities. There is mild midline tenderness of the lumbar spine, no midline thoracic spinal tenderness.  No overlying skin changes and no palpable deformity.  Negative straight leg raise bilaterally.  Neurological: She is alert and oriented to person, place, and time. Coordination normal.  Moving all extremities without difficulty. Bilateral upper and lower extremities with 5/5 strength bilaterally in proximal and distal muscle groups.  Sensation intact throughout bilateral upper and lower extremities.  Skin: Skin is warm and dry. She is not diaphoretic.  Psychiatric: She has a normal mood and affect. Her behavior is normal.  Nursing note and vitals reviewed.    ED  Treatments / Results  Labs (all labs ordered are listed, but only abnormal results are displayed) Labs Reviewed  CBG MONITORING, ED - Abnormal; Notable for the following components:      Result Value   Glucose-Capillary 101 (*)    All other components within normal limits  POC URINE PREG, ED    EKG None  Radiology Dg Lumbar Spine Complete  Result Date: 10/10/2017 CLINICAL DATA:  Three weeks of low mid back pain for the past week with intermittent radiation of the pains to the legs. No known injury EXAM: LUMBAR SPINE - COMPLETE 4+ VIEW COMPARISON:  None in PACs FINDINGS: The lumbar vertebral bodies are preserved in height. The disc space heights are well  maintained. There is no spondylolisthesis. There is no significant facet joint hypertrophy. The observed portions of the sacrum are normal. IMPRESSION: There is no acute or significant chronic bony abnormality of the lumbar spine. Electronically Signed   By: David  [place] M.D.   On: 10/10/2017 11:12   Dg Knee Complete 4 Views Left  Result Date: 10/10/2017 CLINICAL DATA:  Left anterior knee pain for the past 3 weeks with no known injury. EXAM: LEFT KNEE - COMPLETE 4+ VIEW COMPARISON:  Report of a knee series of May 12, 2002 FINDINGS: The bones are subjectively adequately mineralized. There is no lytic nor blastic lesion. The joint spaces are well maintained. No significant osteophyte formation is observed. There is no joint effusion. There is no acute or healing fracture. IMPRESSION: There is no acute or significant chronic bony abnormality of the left knee. Electronically Signed   By: David  [place] M.D.   On: 10/10/2017 11:11    Procedures Procedures (including critical care time)  Medications Ordered in ED Medications  ketorolac (TORADOL) 30 MG/ML injection 30 mg (30 mg Intramuscular Given 10/10/17 0958)     Initial Impression / Assessment and Plan / ED Course  I have reviewed the triage vital signs and the nursing  notes.  Pertinent labs & imaging results that were available during my care of the patient were reviewed by me and considered in my medical decision making (see chart for details).  Presents for evaluation of 3 weeks of left knee pain, thinks she injured it at work where she is on her feet all day and is frequently doing heavy lifting.  She also reports midline low back pain intermittently over the past 3 years which is been worse over the last few days with her knee hurting.  Bilateral lower extremities are neurovascularly intact.  No numbness or weakness, no saddle anesthesia, no loss of bowel or bladder control to suggest cauda equina syndrome.  Patient reports being told she was prediabetic in the past has not followed up with a doctor anytime recently, sugar checked here and is 101 which is reassuring.  Left knee tender over the anterior portion, no appreciable deformity, no swelling, erythema warmth, fevers or chills, no concern for septic arthritis.  Patient is able to range knee greater than 90 degrees with mild discomfort.  X-rays of the low back and left knee are unremarkable.  Pain has improved significantly with Toradol shot.  Patient is obese, and I do think this could be worsening her symptoms.  Will place patient in knee immobilizer with crutches to try and get some relief and weight off of the left knee, work note provided for the next 2 days.  Encouraged to use NSAIDs, Tylenol and over-the-counter lidocaine patches.  If pain is not improving patient follow-up with PCP and orthopedics.  Return precautions discussed.  Patient expresses understanding and is in agreement with plan.  Final Clinical Impressions(s) / ED Diagnoses   Final diagnoses:  Back pain  Left knee pain, unspecified chronicity    ED Discharge Orders        Ordered    naproxen (NAPROSYN) 500 MG tablet  2 times daily     10/10/17 1134       Dartha Lodge, [place] 10/10/17 1144    Eber Hong, MD 10/11/17  [phone]

## 2018-01-10 ENCOUNTER — Other Ambulatory Visit (HOSPITAL_COMMUNITY): Payer: Self-pay | Admitting: *Deleted

## 2018-01-10 DIAGNOSIS — N6310 Unspecified lump in the right breast, unspecified quadrant: Secondary | ICD-10-CM

## 2018-01-30 ENCOUNTER — Ambulatory Visit
Admission: RE | Admit: 2018-01-30 | Discharge: 2018-01-30 | Disposition: A | Discharge status: Home / Self Care | Payer: Self-pay | Source: Ambulatory Visit | Attending: Obstetrics and Gynecology | Admitting: Obstetrics and Gynecology

## 2018-01-30 ENCOUNTER — Ambulatory Visit (HOSPITAL_COMMUNITY)
Admission: RE | Admit: 2018-01-30 | Discharge: 2018-01-30 | Disposition: A | Discharge status: Home / Self Care | Payer: 59 | Source: Ambulatory Visit | Attending: Obstetrics and Gynecology | Admitting: Obstetrics and Gynecology

## 2018-01-30 ENCOUNTER — Encounter (HOSPITAL_COMMUNITY): Payer: Self-pay

## 2018-01-30 VITALS — BP 118/78 | Wt 320.0 lb

## 2018-01-30 DIAGNOSIS — Z1239 Encounter for other screening for malignant neoplasm of breast: Secondary | ICD-10-CM

## 2018-01-30 DIAGNOSIS — N644 Mastodynia: Secondary | ICD-10-CM

## 2018-01-30 DIAGNOSIS — N6310 Unspecified lump in the right breast, unspecified quadrant: Secondary | ICD-10-CM

## 2018-01-30 NOTE — Patient Instructions (Signed)
Explained breast self awareness with Lindsay James. Patient did not need a Pap smear today due to last Pap smear was in 2017 per patient. Let her know BCCCP will cover Pap smears every 3 years unless has a history of abnormal Pap smears. [facility] right breast [facility]. Appointment scheduled for Thursday, January 30, 2018 at 1130. Patient aware of appointment and will be there. Referred to the [facility] and gave resources to the free smoking cessation classes at [facility]. Lindsay James verbalized understanding.  Torunn Chancellor, Kathaleen Maserhristine Poll, RN 9:24 AM

## 2018-01-30 NOTE — Progress Notes (Signed)
Complaints of a right breast lump and pain x 1.5 months. Patient states the pain comes and goes. Patient rates the pain at a 8 out of 10.  Pap Smear: Pap smear not completed today. [facility] and normal per patient. Per patient has no history of an abnormal Pap smear. No Pap smear results are in Epic.  Physical exam: Breasts Breasts symmetrical. No skin abnormalities bilateral breasts. No nipple retraction bilateral breasts. No nipple discharge bilateral breasts. No lymphadenopathy. No lumps palpated bilateral breasts. No lump palpated in patients area of concern. Complaints of tenderness when palpated the right breast at 3 o'clock 12 cm from the nipple at patients area of concern. [facility] for a right breast ultrasound. Appointment scheduled for Thursday, January 30, 2018 at 1130.        Pelvic/Bimanual No Pap smear completed today since last Pap smear was in 2017 per patient. Pap smear not indicated per BCCCP guidelines.   Smoking History: Patient is a current smoker. Discussed smoking cessation with patient. Referred to the [facility] and gave resources to the free smoking cessation classes at [facility].  Patient Navigation: Patient education provided. Access to services provided for patient through BCCCP program.   Breast and Cervical Cancer Risk Assessment: Patient has no family history of breast cancer, known genetic mutations, or radiation treatment to the chest before age 26. Patient has no history of cervical dysplasia, immunocompromised, or DES exposure in-utero. Breast cancer risk assessment completed. No breast cancer risk calculated due to patient is less than 26 years old.

## 2018-02-10 ENCOUNTER — Encounter (HOSPITAL_COMMUNITY): Payer: Self-pay | Admitting: *Deleted

## 2018-04-17 ENCOUNTER — Encounter (HOSPITAL_COMMUNITY): Payer: Self-pay | Admitting: Emergency Medicine

## 2018-04-17 ENCOUNTER — Other Ambulatory Visit: Payer: Self-pay

## 2018-04-17 ENCOUNTER — Emergency Department (HOSPITAL_COMMUNITY): Payer: BLUE CROSS/BLUE SHIELD

## 2018-04-17 ENCOUNTER — Emergency Department (HOSPITAL_COMMUNITY)
Admission: EM | Admit: 2018-04-17 | Discharge: 2018-04-17 | Disposition: A | Discharge status: Home / Self Care | Payer: BLUE CROSS/BLUE SHIELD | Attending: Emergency Medicine | Admitting: Emergency Medicine

## 2018-04-17 DIAGNOSIS — Z91010 Allergy to peanuts: Secondary | ICD-10-CM | POA: Diagnosis not present

## 2018-04-17 DIAGNOSIS — F17210 Nicotine dependence, cigarettes, uncomplicated: Secondary | ICD-10-CM | POA: Diagnosis not present

## 2018-04-17 DIAGNOSIS — M25562 Pain in left knee: Secondary | ICD-10-CM | POA: Insufficient documentation

## 2018-04-17 DIAGNOSIS — J45909 Unspecified asthma, uncomplicated: Secondary | ICD-10-CM | POA: Diagnosis not present

## 2018-04-17 DIAGNOSIS — Z79899 Other long term (current) drug therapy: Secondary | ICD-10-CM | POA: Insufficient documentation

## 2018-04-17 DIAGNOSIS — E119 Type 2 diabetes mellitus without complications: Secondary | ICD-10-CM | POA: Diagnosis not present

## 2018-04-17 DIAGNOSIS — M25572 Pain in left ankle and joints of left foot: Secondary | ICD-10-CM | POA: Insufficient documentation

## 2018-04-17 NOTE — ED Provider Notes (Signed)
[facility] EMERGENCY DEPARTMENT Provider Note   CSN: [id number] Arrival date & time: 04/17/18  [id number]     History   Chief Complaint Chief Complaint  Patient presents with  . Knee Pain    HPI Lindsay James is a 28 y.o. female.  HPI Patient presents with left knee ankle pain.  Has had for the last few months.  Seen in the ER around 6 months ago for the same.  Has been using Tylenol crutches and knee brace at times.  Worse with walking.  States now her left ankle is also hurting.  No new trauma.  No numbness weakness.  No confusion.  Her mother has rheumatoid arthritis. Past Medical History:  Diagnosis Date  . Asthma   . Diabetes mellitus without complication (HCC)   . High cholesterol   . Migraine     Patient Active Problem List   Diagnosis Date Noted  . [redacted] weeks gestation of pregnancy   . Cerebral ventriculomegaly of fetus   . Cervical insufficiency during pregnancy in second trimester, antepartum   . [redacted] weeks gestation of pregnancy     History reviewed. No pertinent surgical history.   OB History    Gravida  1   Para      Term      Preterm      AB      Living  1     SAB      TAB      Ectopic      Multiple      Live Births  1            Home Medications    Prior to Admission medications   Medication Sig Start Date End Date Taking? Authorizing Provider  acetaminophen (TYLENOL) 325 MG tablet Take 325-650 mg by mouth daily as needed for mild pain, moderate pain or headache.     [provider]  gabapentin (NEURONTIN) 300 MG capsule Take 300 mg by mouth 3 (three) times daily.    [provider]  ibuprofen (ADVIL,MOTRIN) 200 MG tablet Take 400 mg by mouth every 6 (six) hours as needed for headache or cramping.    [provider]  naproxen (NAPROSYN) 500 MG tablet Take 1 tablet (500 mg total) by mouth 2 (two) times daily. Patient not taking: Reported on 01/30/2018 10/10/17   Dartha LodgeFord, Kelsey N, PA-C    Family  History Family History  Problem Relation Age of Onset  . Diabetes Maternal Grandmother     Social History Social History   Tobacco Use  . Smoking status: Current Some Day Smoker    Packs/day: 0.25    Types: Cigarettes  . Smokeless tobacco: Never Used  Substance Use Topics  . Alcohol use: Yes    Comment: socially  . Drug use: Yes    Types: Marijuana     Allergies   Peanut-containing drug products   Review of Systems Review of Systems  Constitutional: Negative for chills and fever.  Musculoskeletal: Positive for gait problem.       Left knee and left ankle pain.  Neurological: Negative for weakness and numbness.     Physical Exam Updated Vital Signs BP 118/60 (BP Location: Left Arm)   Pulse 76   Temp 98.4 F (36.9 C) (Oral)   Resp 18   Ht 5\' 4"  (1.626 m)   Wt (!) 149.7 kg   LMP 04/14/2018   SpO2 100%   BMI 56.64 kg/m   Physical Exam Constitutional:  Comments: Patient is obese  Musculoskeletal:     Comments: Some tenderness over anterior left knee.  Worse on the upper knee.  Knee is stable.  No lateral tenderness.  No pain on Lockman test.  Some pain with flexion extension at the ankle.  Sensation and pulse intact.  No hip tenderness.  Skin:    Capillary Refill: Capillary refill takes less than 2 seconds.  Neurological:     Mental Status: She is alert.  Psychiatric:        Mood and Affect: Mood normal.      ED Treatments / Results  Labs (all labs ordered are listed, but only abnormal results are displayed) Labs Reviewed - No data to display  EKG None  Radiology Dg Ankle Complete Left  Result Date: 04/17/2018 CLINICAL DATA:  Left ankle pain. EXAM: LEFT ANKLE COMPLETE - 3+ VIEW COMPARISON:  None. FINDINGS: There is no evidence of fracture, dislocation, or joint effusion. The ankle mortise shows normal alignment. There is no evidence of arthropathy or other focal bone abnormality. Soft tissues are unremarkable. IMPRESSION: Negative.  Electronically Signed   By: Irish Lack M.D.   On: 04/17/2018 09:36    Procedures Procedures (including critical care time)  Medications Ordered in ED Medications - No data to display   Initial Impression / Assessment and Plan / ED Course  I have reviewed the triage vital signs and the nursing notes.  Pertinent labs & imaging results that were available during my care of the patient were reviewed by me and considered in my medical decision making (see chart for details).     Patient with acute on chronic knee pain.  Now his ankle pain.  No effusion on the knee.  X-ray of ankle reassuring.  Will have follow-up with orthopedic surgery.  Final Clinical Impressions(s) / ED Diagnoses   Final diagnoses:  Left knee pain, unspecified chronicity  Left ankle pain, unspecified chronicity    ED Discharge Orders    None       Benjiman Core, MD 04/17/18 1021

## 2018-04-17 NOTE — ED Triage Notes (Signed)
Continues to have left knee pain, uses crutches and knee brace from time to time without relief. Xray last summer, denies injury

## 2018-05-05 ENCOUNTER — Encounter: Payer: Self-pay | Admitting: Orthopedic Surgery

## 2018-05-05 ENCOUNTER — Ambulatory Visit: Payer: BLUE CROSS/BLUE SHIELD | Admitting: Orthopedic Surgery

## 2018-05-05 VITALS — BP 111/67 | HR 106 | Ht 64.0 in | Wt 317.0 lb

## 2018-05-05 DIAGNOSIS — M2141 Flat foot [pes planus] (acquired), right foot: Secondary | ICD-10-CM | POA: Diagnosis not present

## 2018-05-05 DIAGNOSIS — M76822 Posterior tibial tendinitis, left leg: Secondary | ICD-10-CM

## 2018-05-05 DIAGNOSIS — M25362 Other instability, left knee: Secondary | ICD-10-CM | POA: Diagnosis not present

## 2018-05-05 DIAGNOSIS — Z6841 Body Mass Index (BMI) 40.0 and over, adult: Secondary | ICD-10-CM

## 2018-05-05 DIAGNOSIS — M2142 Flat foot [pes planus] (acquired), left foot: Secondary | ICD-10-CM

## 2018-05-05 MED ORDER — MELOXICAM 7.5 MG PO TABS: 7.5000 mg | ORAL_TABLET | Freq: Every day | ORAL | 2 refills | Status: DC

## 2018-05-05 NOTE — Progress Notes (Signed)
NEW PATIENT OFFICE VISIT  Chief Complaint  Patient presents with  . Knee Pain    Left knee pain, no injury.    28 year old female presents for evaluation of left knee.  She says it feels like her knee is dislocating.  She complains of 10 out of 10 burning dull aching pain front of the knee for 2-1/2 years listing previous treatment is Tylenol and gabapentin and a knee immobilizer after an ER visit.  She is also complaining of medial ankle pain.   Review of Systems  Constitutional: Negative for chills, fever and weight loss.  Respiratory: Negative for shortness of breath.   Cardiovascular: Negative for chest pain.  Neurological: Negative for tingling.     Past Medical History:  Diagnosis Date  . Asthma   . Diabetes mellitus without complication (HCC)   . High cholesterol   . Migraine     No past surgical history on file.  Family History  Problem Relation Age of Onset  . Diabetes Maternal Grandmother   . Arthritis Mother   . Cancer Father    Social History   Tobacco Use  . Smoking status: Current Some Day Smoker    Packs/day: 0.25    Types: Cigarettes  . Smokeless tobacco: Never Used  Substance Use Topics  . Alcohol use: Yes    Comment: socially  . Drug use: Yes    Types: Marijuana    Allergies  Allergen Reactions  . Peanut-Containing Drug Products Rash    No outpatient medications have been marked as taking for the 05/05/18 encounter (Office Visit) with Vickki Hearing, MD.    BP 111/67   Pulse (!) 106   Ht 5\' 4"  (1.626 m)   Wt (!) 317 lb (143.8 kg)   LMP 04/14/2018   BMI 54.41 kg/m   Physical Exam Vitals signs reviewed.  Constitutional:      Appearance: Normal appearance. She is well-developed.  Neurological:     Mental Status: She is alert and oriented to person, place, and time.  Psychiatric:        Attention and Perception: Attention normal.        Mood and Affect: Mood and affect normal.        Speech: Speech normal.        Behavior:  Behavior normal.        Thought Content: Thought content normal.        Judgment: Judgment normal.     Ortho Exam Right knee no tenderness full range of motion stable patella and collateral ligaments and cruciate ligaments muscle tone normal skin is intact pulses are good station is normal  Left knee tenderness along the peripatellar soft tissues painful range of motion of the patella excessive motion in the patella muscle tone is normal other ligaments are stable neurovascular exam is intact skin is normal   MEDICAL DECISION SECTION  Xrays were done at [facility] My independent reading of xrays:  4 views of the knee no acute fracture dislocation or degenerative arthritis is seen  The patient meets the AMA guidelines for Morbid (severe) obesity with a BMI > 40.0 and I have recommended weight loss.  Encounter Diagnoses  Name Primary?  . Pes planus of both feet   . Posterior tibial tendon dysfunction (PTTD) of left lower extremity   . Patellar instability of left knee Yes    PLAN: (Rx., injectx, surgery, frx, mri/ct) PT PATIENT EDUCATION NSAIDS   No orders of the defined types were  placed in this encounter.   Fuller Canada, MD  05/05/2018 2:42 PM

## 2018-05-21 ENCOUNTER — Encounter (HOSPITAL_COMMUNITY): Payer: Self-pay

## 2018-05-21 ENCOUNTER — Other Ambulatory Visit: Payer: Self-pay

## 2018-05-21 ENCOUNTER — Ambulatory Visit (HOSPITAL_COMMUNITY): Payer: BLUE CROSS/BLUE SHIELD | Attending: Orthopedic Surgery

## 2018-05-21 DIAGNOSIS — R29898 Other symptoms and signs involving the musculoskeletal system: Secondary | ICD-10-CM | POA: Diagnosis present

## 2018-05-21 DIAGNOSIS — M6281 Muscle weakness (generalized): Secondary | ICD-10-CM | POA: Diagnosis present

## 2018-05-21 DIAGNOSIS — G8929 Other chronic pain: Secondary | ICD-10-CM | POA: Insufficient documentation

## 2018-05-21 DIAGNOSIS — M25562 Pain in left knee: Secondary | ICD-10-CM | POA: Insufficient documentation

## 2018-05-21 NOTE — Therapy (Signed)
[facility] [address] [place], [place], [address] Phone: [phone]   Fax:  [phone]  Physical Therapy Evaluation  Patient Details  Name: Lindsay James MRN: [id number] Date of Birth: 04-16-1990 Referring Provider (PT): Fuller Canada, MD   Encounter Date: 05/21/2018  PT End of Session - 05/21/18 1452    Visit Number  1    Number of Visits  8    Date for PT Re-Evaluation  06/18/18    Authorization Type  [employer] Other    Authorization Time Period  05/21/18 to 06/18/18    Authorization - Visit Number  1    Authorization - Number of Visits  30   PT/OT/chiro combined   PT Start Time  1346    PT Stop Time  1427    PT Time Calculation (min)  41 min    Activity Tolerance  Patient tolerated treatment well    Behavior During Therapy  [facility] for tasks assessed/performed       Past Medical History:  Diagnosis Date  . Asthma   . Diabetes mellitus without complication (HCC)   . High cholesterol   . Migraine     History reviewed. No pertinent surgical history.  There were no vitals filed for this visit.   Subjective Assessment - 05/21/18 1352    Subjective  Pt states atht she has been having L knee pain for about 2.5 years. She is not sure what has caused it. About 2-3 months ago, her knee really started hurting her, to the point that she couldn't stand through her shifts at work. She doesn't remember any trauma to the knee. The L ankle has started to bother her as well which she thinks is due to the knee pain worsening 2-3 months ago. She was given a leg brace at the hospital for this pain, uses some topical creams, and intermittently will try medications but states that none of these things have helped. She denis any catching but states that it will buckle on her intermittently. She has the most difficulty walking and standing too long due to the pain. Her pain is constant.     Limitations  Walking;Standing    How long can you sit  comfortably?  15 mins    How long can you stand comfortably?  10 mins    How long can you walk comfortably?  30-91mins    Patient Stated Goals  being able to walk better    Currently in Pain?  Yes    Pain Score  7     Pain Location  Knee    Pain Orientation  Left    Pain Descriptors / Indicators  Throbbing    Pain Type  Chronic pain    Pain Onset  More than a month ago    Pain Frequency  Constant    Aggravating Factors   walking and standing    Pain Relieving Factors  unsure    Effect of Pain on Daily Activities  increases         [facility] PT Assessment - 05/21/18 0001      Assessment   Medical Diagnosis  pes planus of both feet, posterior tibial tendon dysfunction or LLE, patellar instability of L knee    Referring Provider (PT)  Fuller Canada, MD    Onset Date/Surgical Date  --   2.5 years ago it started, worsened over 2-30months   Next MD Visit  unsure    Prior Therapy  none  Balance Screen   Has the patient fallen in the past 6 months  No    Has the patient had a decrease in activity level because of a fear of falling?   No    Is the patient reluctant to leave their home because of a fear of falling?   No      Prior Function   Level of Independence  Independent    Vocation  Full time employment    Vocation Requirements  works full time at [employer], doing a little bit of everything    Leisure  play games with son and boyfriend, [place] now [place]      Observation/Other Assessments   Observations  extreme pes planus bilaterally    Focus on Therapeutic Outcomes (FOTO)   to be completed next visit      Functional Tests   Functional tests  Step down      Step Down   Comments  bil single leg step downs 7" step: R on step valgus but WNL, L on step: pt demo'd increased knee valgus, pain, and L trunk rotation (due to pain)      Posture/Postural Control   Posture/Postural Control  Postural limitations    Postural Limitations  Rounded  Shoulders;Forward head;Increased thoracic kyphosis      ROM / Strength   AROM / PROM / Strength  AROM;Strength      AROM   Overall AROM Comments  bil knees are symmetrical via gross assessment    AROM Assessment Site  Knee;Ankle    Right/Left Knee  --    Right Ankle Dorsiflexion  0    Right Ankle Plantar Flexion  50    Right Ankle Inversion  16    Right Ankle Eversion  10    Left Ankle Dorsiflexion  -3    Left Ankle Plantar Flexion  45    Left Ankle Inversion  19    Left Ankle Eversion  13      Strength   Strength Assessment Site  Hip;Knee;Ankle    Right Hip Flexion  5/5    Right Hip Extension  4-/5    Right Hip ABduction  4/5    Left Hip Flexion  4+/5    Left Hip Extension  3-/5    Left Hip ABduction  4/5    Right Knee Flexion  4+/5    Right Knee Extension  5/5    Left Knee Flexion  4+/5    Left Knee Extension  4+/5    Right Ankle Dorsiflexion  5/5    Left Ankle Dorsiflexion  5/5      Palpation   Patella mobility  WNL    Palpation comment  tenderness along lateral joint line; mild quad restrictions      Special Tests    Special Tests  Laxity/Instability Tests    Laxity/Instability   Anterior drawer test;Posterior drawer test      Anterior drawer test   Findings  Negative    Comment  Bil      Posterior drawer test   Findings  Negative    Comments  Bil      Ambulation/Gait   Ambulation Distance (Feet)  606 Feet    Assistive device  None    Gait Pattern  Step-through pattern;Trendelenburg      Balance   Balance Assessed  Yes      Static Standing Balance   Static Standing - Balance Support  No upper extremity  supported    Static Standing Balance -  Activities   Single Leg Stance - Right Leg;Single Leg Stance - Left Leg    Static Standing - Comment/# of Minutes  R: 13sec or < L: 5.7sec or <            Objective measurements completed on examination: See above findings.        PT Education - 05/21/18 1452    Education Details  exam findings, HEP,  POC    Person(s) Educated  Patient    Methods  Explanation;Demonstration;Handout    Comprehension  Verbalized understanding       PT Short Term Goals - 05/21/18 1455      PT SHORT TERM GOAL #1   Title  Pt will be independent with HEP and perform consistently in order to reduce pain and promote return to PLOF.     Time  2    Period  Weeks    Status  New    Target Date  06/04/18      PT SHORT TERM GOAL #2   Title  Pt will have improved L ankle DF to neutral (0deg) or better in order to help reduce L knee pain and maximize stair ambulation    Time  2    Period  Weeks    Status  New      PT SHORT TERM GOAL #3   Title  Pt will be able to perform bil SLS for 15 sec or > without trunk lean to demo improved core and functional hip strength so as to maximize her gait and stair ambulation.     Time  2    Period  Weeks    Status  New        PT Long Term Goals - 05/21/18 1455      PT LONG TERM GOAL #1   Title  Pt will have improved proximal hip strength to 4/5 or > in order to reduce knee pain and improve functional tasks.     Time  4    Period  Weeks    Status  New    Target Date  06/18/18      PT LONG TERM GOAL #2   Title  Pt will be able to perform without pain and with gait WFL to demo improved overall functional strength and maximize her function at work.     Time  4    Period  Weeks    Status  New      PT LONG TERM GOAL #3   Title  Pt will be able to perform 10 single leg step downs on standard step height without pain and with mechanics WFL to demo improved functional strength and maximize her community access.     Time  4    Period  Weeks    Status  New      PT LONG TERM GOAL #4   Title  Pt will report being able to stand and walk and perform all work duties for 1 hour or > before onset of her pain to demo improved overall function and maximize her work duties.     Time  4    Period  Weeks    Status  New             Plan - 05/21/18 1453    Clinical  Impression Statement  Pt is pleasant 27YO F who presents to O[facility] with c/o chronic L knee pain  for the last 2.5 years, with worsening symptoms over the last 2-3 months. Pt has deficits in hip strength, core strength, functional strength, along with reduced L ankle DF ROM as well as significant pes planus of bil feet. Pt with difficulty with single leg step downs on LLE due to valgus and pain and has difficulty walking due to pain. Pt works 35-40hrs/week for her job where she has to stand and walk during every shift. PT feels that pt's knee pain is a result of her flat feet and hip weakness and strongly encouraged her to obtain shoe inserts to assist with reducing her longitudinal arch collapse. Pt needs skilled PT intervention to address these impairments in order to reduce her pain and maximize overall function at home and work.     Stability/Clinical Decision Making  Stable/Uncomplicated    Clinical Decision Making  Low    Rehab Potential  Good    PT Frequency  2x / week    PT Duration  4 weeks    PT Treatment/Interventions  ADLs/Self Care Home Management;Cryotherapy;Electrical Stimulation;Aquatic Therapy;Moist Heat;Traction;Ultrasound;DME Instruction;Gait training;Stair training;Functional mobility training;Therapeutic exercise;Balance training;Neuromuscular re-education;Patient/family education;Orthotic Fit/Training;Manual techniques;Scar mobilization;Passive range of motion;Dry needling;Energy conservation;Taping;Joint Manipulations    PT Next Visit Plan  review goals, administer FOTO; f/u on shoe insert/orthotic purchase; begin functional, hip, core strenghtening, stretch calves, balance training, progressing as able.     PT Home Exercise Plan  eval: bridging, SLR, sidelying hip abd    Consulted and Agree with Plan of Care  Patient       Patient will benefit from skilled therapeutic intervention in order to improve the following deficits and impairments:  Abnormal gait, Decreased activity  tolerance, Decreased balance, Decreased range of motion, Decreased strength, Difficulty walking, Hypomobility, Increased fascial restricitons, Impaired flexibility, Improper body mechanics, Postural dysfunction, Pain, Obesity  Visit Diagnosis: Chronic pain of left knee - Plan: PT plan of care cert/re-cert  Muscle weakness (generalized) - Plan: PT plan of care cert/re-cert  Other symptoms and signs involving the musculoskeletal system - Plan: PT plan of care cert/re-cert     Problem List Patient Active Problem List   Diagnosis Date Noted  . [redacted] weeks gestation of pregnancy   . Cerebral ventriculomegaly of fetus   . Cervical insufficiency during pregnancy in second trimester, antepartum   . [redacted] weeks gestation of pregnancy        Jac Canavan PT, DPT   [facility] [address] [place], [place], [address] Phone: [phone]   Fax:  [phone]  Name: ELSA LAD MRN: [id number] Date of Birth: 1990-07-05

## 2018-05-27 ENCOUNTER — Ambulatory Visit (HOSPITAL_COMMUNITY): Payer: BLUE CROSS/BLUE SHIELD | Admitting: Physical Therapy

## 2018-05-27 DIAGNOSIS — M25562 Pain in left knee: Secondary | ICD-10-CM | POA: Diagnosis not present

## 2018-05-27 DIAGNOSIS — G8929 Other chronic pain: Secondary | ICD-10-CM

## 2018-05-27 DIAGNOSIS — M6281 Muscle weakness (generalized): Secondary | ICD-10-CM

## 2018-05-27 DIAGNOSIS — R29898 Other symptoms and signs involving the musculoskeletal system: Secondary | ICD-10-CM

## 2018-05-27 NOTE — Therapy (Signed)
[facility] [address] [place], [place], [address] Phone: [phone]   Fax:  [phone]  Physical Therapy Treatment  Patient Details  Name: Lindsay James MRN: [id number] Date of Birth: 10/04/1990 Referring Provider (PT): Fuller Canada, MD   Encounter Date: 05/27/2018  PT End of Session - 05/27/18 1448    Visit Number  2    Number of Visits  8    Date for PT Re-Evaluation  06/18/18    Authorization Type  BCBS Other    Authorization Time Period  05/21/18 to 06/18/18    Authorization - Visit Number  2    Authorization - Number of Visits  30   PT/OT/chiro combined   PT Start Time  1440    PT Stop Time  1520    PT Time Calculation (min)  40 min    Activity Tolerance  Patient tolerated treatment well    Behavior During Therapy  [facility] for tasks assessed/performed       Past Medical History:  Diagnosis Date  . Asthma   . Diabetes mellitus without complication (HCC)   . High cholesterol   . Migraine     No past surgical history on file.  There were no vitals filed for this visit.  Subjective Assessment - 05/27/18 1505    Subjective  pt states she did get insoles and has worn them a couple times at work with a little improvment in back and knee while wearing.  STates she has 9/10 lower back pain today but denies need to go to ED. knee pain is 7/10.  States her pain is all day, everyday, doesn't matter if rest or active.  States she is not sleeping well at night, getting up every 3 hours due to her back.  States she has only done the exercise where she lays on her side at home.  Pt unable to recall the other 2 exercises on her HEP.                       [facility] Adult PT Treatment/Exercise - 05/27/18 0001      Knee/Hip Exercises: Standing   Heel Raises  15 reps    Hip Abduction  Both;15 reps    Hip Extension  Both;15 reps      Knee/Hip Exercises: Supine   Bridges  Both;10 reps    Bridges Limitations  reveiwed  HEP    Straight Leg Raises  Both;10 reps    Straight Leg Raises Limitations  reveiwed HEP      Knee/Hip Exercises: Sidelying   Hip ABduction  Both;10 reps    Hip ABduction Limitations  reveiwed HEP             PT Education - 05/27/18 1504    Education Details  reveiwed goals, HEP and POC moving forward    Person(s) Educated  Patient    Methods  Explanation    Comprehension  Verbalized understanding;Returned demonstration;Verbal cues required       PT Short Term Goals - 05/27/18 1520      PT SHORT TERM GOAL #1   Title  Pt will be independent with HEP and perform consistently in order to reduce pain and promote return to PLOF.     Time  2    Period  Weeks    Status  On-going    Target Date  06/04/18      PT SHORT TERM GOAL #2   Title  Pt will have improved L ankle DF to neutral (0deg) or better in order to help reduce L knee pain and maximize stair ambulation    Time  2    Period  Weeks    Status  On-going      PT SHORT TERM GOAL #3   Title  Pt will be able to perform bil SLS for 15 sec or > without trunk lean to demo improved core and functional hip strength so as to maximize her gait and stair ambulation.     Time  2    Period  Weeks    Status  On-going        PT Long Term Goals - 05/27/18 1520      PT LONG TERM GOAL #1   Title  Pt will have improved proximal hip strength to 4/5 or > in order to reduce knee pain and improve functional tasks.     Time  4    Period  Weeks    Status  On-going      PT LONG TERM GOAL #2   Title  Pt will be able to perform <MEASUREMEMenlo Park Surgical HospitalHuntsville Memorial Hospital104T>6962[place]<MEASUREMENTVa Medical Center - SheridanInova [facility]93an6962[place]<MEASUREMENTAsc[facility] HightsKindred [facility] [place] [facility]70an6962[place]<MEASUREMENTHiLLCrest HospitalAlexander Hospital54an6962[place]<MEASUREMENTPower County Hospital DistrictBrook[facility] Center15an6962[place]<MEASUREMENTKootenai Medical Center99[facility]84an6962[place]<MEASUREMENT[facility] CampusP[facility]31an6962[place]<MEASUREMENTVa [facility] Healthcare SystemWill[facility]44an6962[place]<MEASUREMENTW [facility] Medical CenterHamilton Memorial Hospital District2an6962[place]<MEASUREMENTSumma Wadsworth-Rittman HospitalNovant Health Buncombe Outpatient Surgery41an6962[place]<MEASUREMENTParagon Laser And Eye Surgery CenterGraham Hospital Association44an6962[place]<MEASUREMENTMs Band Of Choctaw HospitalUt Health East [facility] Long Term Care63an6962[place]<MEASUREMENTLos [facility] Valley[facility]31an6962[place]LoveKoreana Neighboursn and with gait WFL to demo improved overall functional strength and maximize her function at work.     Time  4    Period  Weeks    Status  On-going      PT LONG TERM GOAL #3   Title  Pt will be able to perform 10 single leg step downs on standard step height without pain and with mechanics WFL to demo improved functional strength and maximize her community access.     Time  4    Period   Weeks    Status  On-going      PT LONG TERM GOAL #4   Title  Pt will report being able to stand and walk and perform all work duties for 1 hour or > before onset of her pain to demo improved overall function and maximize her work duties.     Time  4    Period  Weeks    Status  On-going            Plan - 05/27/18 1507    Clinical Impression Statement  Reveiwed goals, HEP and pOC moving forward.  discussed importance of completing her HEP and wearing her insoles at all times for optimal results.  Pt verbalized understanding.  Began standing exercises without pain, however required max cues for correct form and mm isolation.  Administered FOTO today  with resultant 70% impairment (29.7 FS)    Stability/Clinical Decision Making  Stable/Uncomplicated    Rehab Potential  Good    PT Frequency  2x / week    PT Duration  4 weeks    PT Treatment/Interventions  ADLs/Self Care Home Management;Cryotherapy;Electrical Stimulation;Aquatic Therapy;Moist Heat;Traction;Ultrasound;DME Instruction;Gait training;Stair training;Functional mobility training;Therapeutic exercise;Balance training;Neuromuscular re-education;Patient/family education;Orthotic Fit/Training;Manual techniques;Scar mobilization;Passive range of motion;Dry needling;Energy conservation;Taping;Joint Manipulations    PT Next Visit Plan  F/U on HEP compliance.  Progress functional, hip, core strenghtening and balance training.    PT Home Exercise Plan  eval: bridging, SLR, sidelying hip abd    Consulted and Agree with Plan of Care  Patient       Patient will benefit from skilled therapeutic intervention in order to improve the following deficits and impairments:  Abnormal gait, Decreased activity tolerance, Decreased balance, Decreased range of motion, Decreased strength, Difficulty walking, Hypomobility, Increased fascial restricitons, Impaired flexibility, Improper body mechanics,  Postural dysfunction, Pain, Obesity  Visit  Diagnosis: Chronic pain of left knee  Muscle weakness (generalized)  Other symptoms and signs involving the musculoskeletal system     Problem List Patient Active Problem List   Diagnosis Date Noted  . [redacted] weeks gestation of pregnancy   . Cerebral ventriculomegaly of fetus   . Cervical insufficiency during pregnancy in second trimester, antepartum   . [redacted] weeks gestation of pregnancy    Lurena Nida, PTA/CLT [phone]  Lurena Nida 05/27/2018, 3:24 PM  [facility] [address] [place], [place], [address] Phone: [phone]   Fax:  [phone]  Name: SAE EUTSLER MRN: [id number] Date of Birth: 1991/02/12

## 2018-05-29 ENCOUNTER — Other Ambulatory Visit: Payer: Self-pay

## 2018-05-29 ENCOUNTER — Encounter (HOSPITAL_COMMUNITY): Payer: Self-pay

## 2018-05-29 ENCOUNTER — Ambulatory Visit (HOSPITAL_COMMUNITY): Payer: BLUE CROSS/BLUE SHIELD

## 2018-05-29 DIAGNOSIS — R29898 Other symptoms and signs involving the musculoskeletal system: Secondary | ICD-10-CM

## 2018-05-29 DIAGNOSIS — M25562 Pain in left knee: Principal | ICD-10-CM

## 2018-05-29 DIAGNOSIS — G8929 Other chronic pain: Secondary | ICD-10-CM

## 2018-05-29 DIAGNOSIS — M6281 Muscle weakness (generalized): Secondary | ICD-10-CM

## 2018-05-29 NOTE — Therapy (Signed)
[facility] [address] [place], [place], [address] Phone: [phone]   Fax:  [phone]  Physical Therapy Treatment  Patient Details  Name: NYNA MUNDIS MRN: [id number] Date of Birth: Feb 01, 1991 Referring Provider (PT): Fuller Canada, MD   Encounter Date: 05/29/2018  PT End of Session - 05/29/18 1545    Visit Number  3    Number of Visits  8    Authorization Type  BCBS Other    Authorization Time Period  05/21/18 to 06/18/18    Authorization - Visit Number  3    Authorization - Number of Visits  30   Pt/OT/Chiro combined   PT Start Time  1537   pt late for apt   PT Stop Time  1602    PT Time Calculation (min)  25 min    Activity Tolerance  Patient tolerated treatment well    Behavior During Therapy  [facility] for tasks assessed/performed       Past Medical History:  Diagnosis Date  . Asthma   . Diabetes mellitus without complication (HCC)   . High cholesterol   . Migraine     History reviewed. No pertinent surgical history.  There were no vitals filed for this visit.  Subjective Assessment - 05/29/18 1543    Subjective  Pt reports improvements with tolerance with standing with work, pain scale reduced to 6/10 for LBP and knee pain.  Reports she wearing the insoles daily.      Patient Stated Goals  being able to walk better    Currently in Pain?  Yes    Pain Score  6     Pain Location  Knee    Pain Orientation  Left    Pain Descriptors / Indicators  Aching;Throbbing    Pain Type  Chronic pain    Pain Onset  More than a month ago    Pain Frequency  Constant    Aggravating Factors   walking and standing    Pain Relieving Factors  unsure    Effect of Pain on Daily Activities  increases                       [facility] Adult PT Treatment/Exercise - 05/29/18 0001      Knee/Hip Exercises: Standing   Heel Raises  15 reps    Heel Raises Limitations  heel and toe raises incline slope    Hip Abduction   Both;15 reps    Hip Extension  Both;15 reps    SLS  Rt 20", Lt 13" max of 3      Knee/Hip Exercises: Supine   Bridges  Both;10 reps    Bridges Limitations  reveiwed HEP, encouraged to incorparate with breathing to reduce holding breath with exercise    Straight Leg Raises  Both;10 reps    Straight Leg Raises Limitations  reveiwed HEP      Knee/Hip Exercises: Sidelying   Hip ABduction  Both;10 reps    Hip ABduction Limitations  reveiwed HEP             PT Education - 05/29/18 1546    Education Details  Reviewed complaince with HEP, able to verbalize and demonstrate without cueing required; discussed frequency and encouraged pt to increase frequency for maximal benefits wiht therapy    Person(s) Educated  Patient    Methods  Explanation    Comprehension  Verbalized understanding;Returned demonstration       PT Short  Term Goals - 05/27/18 1520      PT SHORT TERM GOAL #1   Title  Pt will be independent with HEP and perform consistently in order to reduce pain and promote return to PLOF.     Time  2    Period  Weeks    Status  On-going    Target Date  06/04/18      PT SHORT TERM GOAL #2   Title  Pt will have improved L ankle DF to neutral (0deg) or better in order to help reduce L knee pain and maximize stair ambulation    Time  2    Period  Weeks    Status  On-going      PT SHORT TERM GOAL #3   Title  Pt will be able to perform bil SLS for 15 sec or > without trunk lean to demo improved core and functional hip strength so as to maximize her gait and stair ambulation.     Time  2    Period  Weeks    Status  On-going        PT Long Term Goals - 05/27/18 1520      PT LONG TERM GOAL #1   Title  Pt will have improved proximal hip strength to 4/5 or > in order to reduce knee pain and improve functional tasks.     Time  4    Period  Weeks    Status  On-going      PT LONG TERM GOAL #2   Title  Pt will be able to perform without pain and with gait WFL to demo  improved overall functional strength and maximize her function at work.     Time  4    Period  Weeks    Status  On-going      PT LONG TERM GOAL #3   Title  Pt will be able to perform 10 single leg step downs on standard step height without pain and with mechanics WFL to demo improved functional strength and maximize her community access.     Time  4    Period  Weeks    Status  On-going      PT LONG TERM GOAL #4   Title  Pt will report being able to stand and walk and perform all work duties for 1 hour or > before onset of her pain to demo improved overall function and maximize her work duties.     Time  4    Period  Weeks    Status  On-going            Plan - 05/29/18 1548    Clinical Impression Statement  Pt late for apt today, unable to complete full POC.  Discussion held wiht need to reschedule apts to enable full POC wiht work schedule.  Began session reviewing complaince and form with HEP, pt able to verbalize and demonstrate appropraite mechanics though reports with work schedule compliance maybe 1x a week.  Pt educated on benefits of increased compliance iwth HEP and encouraged to break up exercises throughout the day if limited by time.  Added SLS for hip and core stability wiht cueing to improve focal point to assist wiht balance.  No reoprts of increased pain through session.      Stability/Clinical Decision Making  Stable/Uncomplicated    Rehab Potential  Good    PT Frequency  2x / week    PT Duration  4  weeks    PT Treatment/Interventions  ADLs/Self Care Home Management;Cryotherapy;Electrical Stimulation;Aquatic Therapy;Moist Heat;Traction;Ultrasound;DME Instruction;Gait training;Stair training;Functional mobility training;Therapeutic exercise;Balance training;Neuromuscular re-education;Patient/family education;Orthotic Fit/Training;Manual techniques;Scar mobilization;Passive range of motion;Dry needling;Energy conservation;Taping;Joint Manipulations    PT Next Visit Plan    Progress functional, hip, core strenghtening and balance training.    PT Home Exercise Plan  eval: bridging, SLR, sidelying hip abd       Patient will benefit from skilled therapeutic intervention in order to improve the following deficits and impairments:  Abnormal gait, Decreased activity tolerance, Decreased balance, Decreased range of motion, Decreased strength, Difficulty walking, Hypomobility, Increased fascial restricitons, Impaired flexibility, Improper body mechanics, Postural dysfunction, Pain, Obesity  Visit Diagnosis: Chronic pain of left knee  Other symptoms and signs involving the musculoskeletal system  Muscle weakness (generalized)     Problem List Patient Active Problem List   Diagnosis Date Noted  . [redacted] weeks gestation of pregnancy   . Cerebral ventriculomegaly of fetus   . Cervical insufficiency during pregnancy in second trimester, antepartum   . [redacted] weeks gestation of pregnancy    Liana Crocker; CBIS [phone]  Juel Burrow 05/29/2018, 4:11 PM  [facility] [address] [place], [place], [address] Phone: [phone]   Fax:  [phone]  Name: JAYANI BEGNOCHE MRN: [id number] Date of Birth: Feb 06, 1991

## 2018-06-01 ENCOUNTER — Encounter (HOSPITAL_COMMUNITY): Payer: Self-pay | Admitting: Emergency Medicine

## 2018-06-01 ENCOUNTER — Other Ambulatory Visit: Payer: Self-pay

## 2018-06-01 ENCOUNTER — Emergency Department (HOSPITAL_COMMUNITY)
Admission: EM | Admit: 2018-06-01 | Discharge: 2018-06-01 | Disposition: A | Discharge status: Home / Self Care | Payer: BLUE CROSS/BLUE SHIELD | Attending: Emergency Medicine | Admitting: Emergency Medicine

## 2018-06-01 DIAGNOSIS — M545 Low back pain: Secondary | ICD-10-CM | POA: Diagnosis present

## 2018-06-01 DIAGNOSIS — J45909 Unspecified asthma, uncomplicated: Secondary | ICD-10-CM | POA: Diagnosis not present

## 2018-06-01 DIAGNOSIS — E119 Type 2 diabetes mellitus without complications: Secondary | ICD-10-CM | POA: Insufficient documentation

## 2018-06-01 DIAGNOSIS — M5432 Sciatica, left side: Secondary | ICD-10-CM | POA: Diagnosis not present

## 2018-06-01 DIAGNOSIS — F17210 Nicotine dependence, cigarettes, uncomplicated: Secondary | ICD-10-CM | POA: Diagnosis not present

## 2018-06-01 DIAGNOSIS — Z79899 Other long term (current) drug therapy: Secondary | ICD-10-CM | POA: Diagnosis not present

## 2018-06-01 MED ORDER — CYCLOBENZAPRINE HCL 10 MG PO TABS: 10.0000 mg | ORAL_TABLET | Freq: Three times a day (TID) | ORAL | 0 refills | Status: DC | PRN

## 2018-06-01 MED ORDER — PREDNISONE 10 MG PO TABS: ORAL_TABLET | ORAL | 0 refills | Status: DC

## 2018-06-01 MED ORDER — HYDROCODONE-ACETAMINOPHEN 5-325 MG PO TABS: ORAL_TABLET | ORAL | 0 refills | Status: DC

## 2018-06-01 NOTE — ED Triage Notes (Signed)
Patient c/o low back pain that started yesterday while at work. Denies any injury. Per patient pain started due to "knee and hip issue" in which she is has been going to rehab and seeing orthopedic (Dr Jerilynn Mages). Denies any complications with urination or BMs. Denies any numbness. CNS intake. Patient took tylenol with no relief, last dose yesterday.

## 2018-06-01 NOTE — Discharge Instructions (Addendum)
Alternate ice and heat to your back.  Take the medication as directed.  Call Dr. Mort Sawyers office to arrange a follow-up appt.  No PT for this Tuesday 06/03/18

## 2018-06-02 ENCOUNTER — Telehealth (HOSPITAL_COMMUNITY): Payer: Self-pay

## 2018-06-02 NOTE — ED Provider Notes (Signed)
[facility] EMERGENCY DEPARTMENT Provider Note   CSN: [id number] Arrival date & time: 06/01/18  1206    History   Chief Complaint Chief Complaint  Patient presents with  . Back Pain    HPI Lindsay James is a 28 y.o. female.     HPI  Lindsay James is a 28 y.o. female who presents to the Emergency Department complaining of low back pain and left leg pain for several weeks, worse since one day prior to arrival.  She reports having chronic left knee and hip pain, currently in PT ordered by Dr. Romeo Apple.  She describes dull , constant pain to her lower back that radiates into her buttock and posterior and lateral left upper leg.  Denies recent injury, but states that she has been going to PT twice a week.  She denies fever, chills, urine or bowel changes, abdominal pain, numbness or weakness of the lower extremities.  No recent spinal procedures.  Walking and standing make the pain worse, certain positions improve the pain.    Past Medical History:  Diagnosis Date  . Asthma   . Diabetes mellitus without complication (HCC)   . High cholesterol   . Migraine     Patient Active Problem List   Diagnosis Date Noted  . [redacted] weeks gestation of pregnancy   . Cerebral ventriculomegaly of fetus   . Cervical insufficiency during pregnancy in second trimester, antepartum   . [redacted] weeks gestation of pregnancy     History reviewed. No pertinent surgical history.   OB History    Gravida  1   Para      Term      Preterm      AB      Living  1     SAB      TAB      Ectopic      Multiple      Live Births  1            Home Medications    Prior to Admission medications   Medication Sig Start Date End Date Taking? Authorizing Provider  acetaminophen (TYLENOL) 325 MG tablet Take 325-650 mg by mouth daily as needed for mild pain, moderate pain or headache.     [provider]  cyclobenzaprine (FLEXERIL) 10 MG tablet Take 1 tablet (10 mg total) by mouth 3  (three) times daily as needed. 06/01/18   Veatrice Eckstein, PA-C  gabapentin (NEURONTIN) 300 MG capsule Take 300 mg by mouth 3 (three) times daily.    [provider]  HYDROcodone-acetaminophen (NORCO/VICODIN) 5-325 MG tablet Take one tab po q 4 hrs prn pain 06/01/18   Evana Runnels, PA-C  ibuprofen (ADVIL,MOTRIN) 200 MG tablet Take 400 mg by mouth every 6 (six) hours as needed for headache or cramping.    [provider]  meloxicam (MOBIC) 7.5 MG tablet Take 1 tablet (7.5 mg total) by mouth daily. 05/05/18   Vickki Hearing, MD  predniSONE (DELTASONE) 10 MG tablet Take 6 tablets day one, 5 tablets day two, 4 tablets day three, 3 tablets day four, 2 tablets day five, then 1 tablet day six 06/01/18   Pauline Aus, PA-C    Family History Family History  Problem Relation Age of Onset  . Diabetes Maternal Grandmother   . Arthritis Mother   . Cancer Father     Social History Social History   Tobacco Use  . Smoking status: Current Some Day Smoker  Packs/day: 0.25    Types: Cigarettes  . Smokeless tobacco: Never Used  Substance Use Topics  . Alcohol use: Yes    Comment: socially  . Drug use: Yes    Types: Marijuana     Allergies   Peanut-containing drug products   Review of Systems Review of Systems  Constitutional: Negative for fever.  Respiratory: Negative for shortness of breath.   Cardiovascular: Negative for chest pain and leg swelling.  Gastrointestinal: Negative for abdominal pain, nausea and vomiting.  Genitourinary: Negative for decreased urine volume, difficulty urinating, dysuria and flank pain.  Musculoskeletal: Positive for back pain. Negative for joint swelling and neck pain.  Skin: Negative for rash.  Neurological: Negative for dizziness, weakness and numbness.     Physical Exam Updated Vital Signs BP 128/66 (BP Location: Right Arm)   Pulse 72   Temp 98.7 F (37.1 C) (Oral)   Resp 17   Ht 5\' 6"  (1.676 m)   Wt (!) 143.8 kg    SpO2 100%   BMI 51.17 kg/m   Physical Exam Vitals signs and nursing note reviewed.  Constitutional:      General: She is not in acute distress.    Appearance: She is well-developed.  HENT:     Head: Normocephalic.  Neck:     Musculoskeletal: Normal range of motion and neck supple.  Cardiovascular:     Rate and Rhythm: Normal rate and regular rhythm.     Pulses: Normal pulses.     Comments: DP pulses are strong and palpable bilaterally Pulmonary:     Effort: Pulmonary effort is normal. No respiratory distress.     Breath sounds: Normal breath sounds.  Abdominal:     General: There is no distension.     Palpations: Abdomen is soft.     Tenderness: There is no abdominal tenderness.  Musculoskeletal:        General: Tenderness present.     Lumbar back: She exhibits tenderness and pain. She exhibits normal range of motion, no swelling, no deformity, no laceration and normal pulse.     Comments: ttp of the lower midline lumbar spine and left paraspinal muscles.  Pt has 5/5 strength against resistance of bilateral lower extremities.  Hip flexors and extensors are intact   Skin:    General: Skin is warm.     Capillary Refill: Capillary refill takes less than 2 seconds.     Findings: No rash.  Neurological:     General: No focal deficit present.     Mental Status: She is alert. Mental status is at baseline.     Sensory: No sensory deficit.     Motor: No abnormal muscle tone.     Coordination: Coordination normal.     Gait: Gait normal.     Deep Tendon Reflexes:     Reflex Scores:      Patellar reflexes are 2+ on the right side and 2+ on the left side.      Achilles reflexes are 2+ on the right side and 2+ on the left side.     ED Treatments / Results  Labs (all labs ordered are listed, but only abnormal results are displayed) Labs Reviewed - No data to display  EKG None  Radiology No results found.  Procedures Procedures (including critical care time)  Medications  Ordered in ED Medications - No data to display   Initial Impression / Assessment and Plan / ED Course  I have reviewed the triage vital signs and  the nursing notes.  Pertinent labs & imaging results that were available during my care of the patient were reviewed by me and considered in my medical decision making (see chart for details).        Pt with likely acute on chronic low back pain.  NV intact.  No focal neuro deficits.  No concerning sx's for spinal abscess or cauda equina.  She is ambulatory with a slow but steady gait. No recent injury to suggest need for imaging.  She appears appropriate for d/c home and agrees to tx plan.  Strict return precautions discussed.   Final Clinical Impressions(s) / ED Diagnoses   Final diagnoses:  Sciatica of left side    ED Discharge Orders         Ordered    HYDROcodone-acetaminophen (NORCO/VICODIN) 5-325 MG tablet     06/01/18 1247    cyclobenzaprine (FLEXERIL) 10 MG tablet  3 times daily PRN     06/01/18 1247    predniSONE (DELTASONE) 10 MG tablet     06/01/18 1247           Doniesha Landau, [place], PA-C 06/02/18 2157    Donnetta Hutching, MD 06/02/18 2333

## 2018-06-02 NOTE — Telephone Encounter (Signed)
Pt will not be here tomorrow she was in the ED last night and MD said to take a break. She will be here [place]

## 2018-06-03 ENCOUNTER — Ambulatory Visit (HOSPITAL_COMMUNITY): Payer: BLUE CROSS/BLUE SHIELD

## 2018-06-06 ENCOUNTER — Telehealth (HOSPITAL_COMMUNITY): Payer: Self-pay

## 2018-06-06 ENCOUNTER — Ambulatory Visit (HOSPITAL_COMMUNITY): Payer: BLUE CROSS/BLUE SHIELD

## 2018-06-06 NOTE — Telephone Encounter (Signed)
Called and spoke to pt concerning [facility] O[facility] dept to close for minimal of 2 weeks, plans to reopen on 06/23/18.   Reviewed compliance with HEP.  Pt wishes to be called weekly to check up and will schedule further apts closer to date of re-opening.  [address], LPTA; CBIS [phone]

## 2018-06-10 ENCOUNTER — Ambulatory Visit (HOSPITAL_COMMUNITY): Payer: BLUE CROSS/BLUE SHIELD

## 2018-06-12 ENCOUNTER — Telehealth (HOSPITAL_COMMUNITY): Payer: Self-pay | Admitting: Physical Therapy

## 2018-06-12 ENCOUNTER — Ambulatory Visit (HOSPITAL_COMMUNITY): Payer: BLUE CROSS/BLUE SHIELD

## 2018-06-12 NOTE — Telephone Encounter (Signed)
Therapist called to check-in on patient. There was no answer and left a message stating that if they had any questions or concerns to please call us.   Verne Carrow PT, DPT 12:36 PM, 06/12/18 [phone]

## 2018-06-17 ENCOUNTER — Encounter (HOSPITAL_COMMUNITY): Payer: BLUE CROSS/BLUE SHIELD

## 2018-06-19 ENCOUNTER — Encounter (HOSPITAL_COMMUNITY): Payer: BLUE CROSS/BLUE SHIELD

## 2018-06-26 ENCOUNTER — Telehealth (HOSPITAL_COMMUNITY): Payer: Self-pay | Admitting: Physical Therapy

## 2018-06-26 NOTE — Telephone Encounter (Signed)
Lindsay James was contacted today regarding temporary reduction of Outpatient Rehabilitation Services at [facility] due to concerns for community transmission of COVID-19.  Patient identity was verified.  Assessed if patient needed to be seen in person by clinician (recent fall or acute injury that requires hands on assessment and advice, change in diet order, post-surgical, special cases, etc.).    Patient did not have an acute/special need that requires in person visit. Proceeded with phone call.  Therapist advised the patient to continue to perform her HEP and assured she had no unanswered questions or concerns at this time.   The patient expressed interest in being contacted for an E-Visit, virtual check in, or Telehealth visit to continue their plan of care, when those services become available.  Outpatient Rehabilitation Services at [facility] will follow up with patient at that time. Patient provided email [email or web address]  Patient is aware we can be reached by telephone during limited business hours in the meantime.   Verne Carrow PT, DPT 4:38 PM, 06/26/18 [phone]

## 2018-07-01 ENCOUNTER — Telehealth (HOSPITAL_COMMUNITY): Payer: Self-pay | Admitting: General Practice

## 2018-07-01 NOTE — Telephone Encounter (Signed)
07/01/18  Left a message to schedule the Telehealth visit - also to let her know about insurance verification information

## 2018-07-01 NOTE — Telephone Encounter (Signed)
07/01/18  Pt returned call and said with the copay she wants to think about it and call back on 4/15 to let us know if she still wants to do the Telehealth visits.  I also sent her the text link to set up MyChart.  She was having trouble so I told her I would text the link to see if that would work and if it didn't for her to call us back.

## 2018-07-01 NOTE — Telephone Encounter (Signed)
07/01/18  Patient returned our call and I called her back but left a 2nd message.

## 2018-07-02 ENCOUNTER — Telehealth (HOSPITAL_COMMUNITY): Payer: Self-pay

## 2018-07-02 NOTE — Telephone Encounter (Signed)
Requested return phone call to let us know what patient decided about Telehealth visits, since her copya was 45.00 per visit she wanted to think about it last night. NF 07/02/2018

## 2018-07-03 ENCOUNTER — Telehealth (HOSPITAL_COMMUNITY): Payer: Self-pay

## 2018-07-03 NOTE — Telephone Encounter (Signed)
Weekly phone call check-in made today. No questions or concerns about HEP or her overall status at this time.   She does not want to participate in telehealth due to high co-pay. She is still interested in weekly phone calls to check in with her and potentially update HEP as needed.    Jac Canavan PT, DPT

## 2018-07-09 ENCOUNTER — Telehealth (HOSPITAL_COMMUNITY): Payer: Self-pay

## 2018-07-09 NOTE — Telephone Encounter (Signed)
I called Ms. Igleheart to check in with how she is doing and if she needed any updates on her HEP. She reports this past week her knee has become more painful and it hurts to straighten it. She states it feels as if there is swelling and fluid in her knee when she bends it. She would like to be seen in-person for a visit to check if there are acute changes to her knee. I have scheduled her for this Friday at 1:45 pm.   Valentino Saxon, PT, DPT, [facility] Physical Therapist with [place]  07/09/2018 12:32 PM

## 2018-07-11 ENCOUNTER — Ambulatory Visit (HOSPITAL_COMMUNITY): Payer: BLUE CROSS/BLUE SHIELD | Attending: Orthopedic Surgery | Admitting: Physical Therapy

## 2018-07-11 ENCOUNTER — Encounter (HOSPITAL_COMMUNITY): Payer: Self-pay | Admitting: Physical Therapy

## 2018-07-11 ENCOUNTER — Other Ambulatory Visit: Payer: Self-pay

## 2018-07-11 DIAGNOSIS — R29898 Other symptoms and signs involving the musculoskeletal system: Secondary | ICD-10-CM

## 2018-07-11 DIAGNOSIS — G8929 Other chronic pain: Secondary | ICD-10-CM | POA: Diagnosis present

## 2018-07-11 DIAGNOSIS — M25562 Pain in left knee: Secondary | ICD-10-CM | POA: Insufficient documentation

## 2018-07-11 DIAGNOSIS — M6281 Muscle weakness (generalized): Secondary | ICD-10-CM

## 2018-07-11 NOTE — Therapy (Signed)
[facility] [address] [place], [place], [address] Phone: [phone]   Fax:  [phone]  Physical Therapy Treatment  Patient Details  Name: Lindsay James MRN: [id number] Date of Birth: November 07, 1990 Referring Provider (PT): Fuller Canada, MD   Encounter Date: 07/11/2018  PT End of Session - 07/11/18 1543    Visit Number  4    Number of Visits  8    Authorization Type  BCBS Other    Authorization Time Period  05/21/18 to 06/18/18    Authorization - Visit Number  4    Authorization - Number of Visits  30   Pt/OT/Chiro combined   PT Start Time  1350    PT Stop Time  1437    PT Time Calculation (min)  47 min    Activity Tolerance  Patient tolerated treatment well    Behavior During Therapy  [facility] for tasks assessed/performed       Past Medical History:  Diagnosis Date  . Asthma   . Diabetes mellitus without complication (HCC)   . High cholesterol   . Migraine     History reviewed. No pertinent surgical history.  There were no vitals filed for this visit.  Subjective Assessment - 07/11/18 1358    Subjective  Pt states that she is happy for her job but she has been lifting a lot and she feels like she has reinjured her Left knee.  It stays swollen. and painful.     Patient Stated Goals  being able to walk better    Currently in Pain?  Yes    Pain Score  --   Past 10    Pain Location  Knee    Pain Orientation  Left;Anterior    Pain Descriptors / Indicators  Aching;Throbbing;Sharp    Pain Type  Chronic pain    Pain Onset  More than a month ago    Aggravating Factors   lifting     Pain Relieving Factors  not sure     Effect of Pain on Daily Activities  increases her pain          [facility] PT Assessment - 07/11/18 0001      Assessment   Medical Diagnosis  pes planus of both feet, posterior tibial tendon dysfunction or LLE, patellar instability of L knee    Referring Provider (PT)  Fuller Canada, MD    Onset  Date/Surgical Date  --   2.5 years ago it started, worsened over 2-69months   Next MD Visit  unsure    Prior Therapy  none      Prior Function   Level of Independence  Independent    Vocation  Full time employment    Vocation Requirements  works full time at [employer], doing a little bit of everything    Leisure  play games with son and boyfriend, [place]      Observation/Other Assessments   Observations  --    Focus on Therapeutic Outcomes (FOTO)   --      Functional Tests   Functional tests  --      Step Down   Comments  --      Posture/Postural Control   Posture/Postural Control  Postural limitations    Postural Limitations  Rounded Shoulders;Forward head;Increased thoracic kyphosis  Left Ankle Eversion  --      Strength   Right Hip Flexion  5/5    Right Hip Extension  4-/5    Right Hip ABduction  4/5    Left Hip Flexion  4+/5    Left Hip Extension  3-/5    Left Hip ABduction  4/5    Right Knee Flexion  4+/5    Right Knee Extension  5/5    Left Knee Flexion  4+/5    Left Knee Extension  4+/5    Right Ankle Dorsiflexion  5/5    Left Ankle Dorsiflexion  5/5                                                                           Balance   Balance Assessed  Yes      Static Standing Balance   Static Standing - Balance Support  No upper extremity supported    Static Standing Balance -  Activities   Single Leg Stance - Right Leg;Single Leg Stance - Left Leg                   [facility] Adult PT Treatment/Exercise - 07/11/18 0001      Exercises   Exercises  Knee/Hip      Knee/Hip Exercises: Aerobic   Nustep  hills 2 level 2 x 8'       Knee/Hip Exercises: Standing   Heel Raises  15 reps    Forward Lunges  Both;5 reps    Terminal Knee Extension  Both;Theraband    Functional Squat  10 reps    SLS  x3      Knee/Hip Exercises: Seated   Sit to Sand  10  reps               PT Short Term Goals - 05/27/18 1520      PT SHORT TERM GOAL #1   Title  Pt will be independent with HEP and perform consistently in order to reduce pain and promote return to PLOF.     Time  2    Period  Weeks    Status  On-going    Target Date  06/04/18      PT SHORT TERM GOAL #2   Title  Pt will have improved L ankle DF to neutral (0deg) or better in order to help reduce L knee pain and maximize stair ambulation    Time  2    Period  Weeks    Status  On-going      PT SHORT TERM GOAL #3   Title  Pt will be able to perform bil SLS for 15 sec or > without trunk lean to demo improved core and functional hip strength so as to maximize her gait and stair ambulation.     Time  2    Period  Weeks    Status  On-going        PT Long Term Goals - 05/27/18 1520      PT LONG TERM GOAL #1   Title  Pt will have improved proximal hip strength to 4/5 or > in order to reduce knee pain and improve functional tasks.  Time  4    Period  Weeks    Status  On-going      PT LONG TERM GOAL #2   Title  Pt will be able to perform without pain and with gait WFL to demo improved overall functional strength and maximize her function at work.     Time  4    Period  Weeks    Status  On-going      PT LONG TERM GOAL #3   Title  Pt will be able to perform 10 single leg step downs on standard step height without pain and with mechanics WFL to demo improved functional strength and maximize her community access.     Time  4    Period  Weeks    Status  On-going      PT LONG TERM GOAL #4   Title  Pt will report being able to stand and walk and perform all work duties for 1 hour or > before onset of her pain to demo improved overall function and maximize her work duties.     Time  4    Period  Weeks    Status  On-going            Plan - 07/11/18 1544    Clinical Impression Statement  Pt concerned that she will not be able  to continue working due to her knee  pain.  Therapist updated her HEP and stressed the importance of completing her HEP everyday.  PT verbalized understanding after pt knee felt better post rehab session.  Pt will be seen once a week to allow therapist to update HEP and progress pt to a pain level tolerable for working.     Stability/Clinical Decision Making  Stable/Uncomplicated    Rehab Potential  Good    PT Frequency  2x / week    PT Duration  4 weeks    PT Treatment/Interventions  ADLs/Self Care Home Management;Cryotherapy;Electrical Stimulation;Aquatic Therapy;Moist Heat;Traction;Ultrasound;DME Instruction;Gait training;Stair training;Functional mobility training;Therapeutic exercise;Balance training;Neuromuscular re-education;Patient/family education;Orthotic Fit/Training;Manual techniques;Scar mobilization;Passive range of motion;Dry needling;Energy conservation;Taping;Joint Manipulations    PT Next Visit Plan   Progress functional, hip, core strenghtening and balance training.    PT Home Exercise Plan  eval: bridging, SLR, sidelying hip abd; 4/ 24:  Functional squats, sit to stand, SLS, standing terminal extension  and quad sets.        Patient will benefit from skilled therapeutic intervention in order to improve the following deficits and impairments:  Abnormal gait, Decreased activity tolerance, Decreased balance, Decreased range of motion, Decreased strength, Difficulty walking, Hypomobility, Increased fascial restricitons, Impaired flexibility, Improper body mechanics, Postural dysfunction, Pain, Obesity  Visit Diagnosis: Chronic pain of left knee  Other symptoms and signs involving the musculoskeletal system  Muscle weakness (generalized)     Problem List Patient Active Problem List   Diagnosis Date Noted  . [redacted] weeks gestation of pregnancy   . Cerebral ventriculomegaly of fetus   . Cervical insufficiency during pregnancy in second trimester, antepartum   . [redacted] weeks gestation of pregnancy    Virgina Organ,  [place] CLT [phone] 07/11/2018, 3:47 PM  [facility] [address] [place], [place], [address] Phone: [phone]   Fax:  [phone]  Name: Lindsay James MRN: [id number] Date of Birth: November 09, 1990

## 2018-07-11 NOTE — Patient Instructions (Addendum)
Functional Quadriceps: Chair Squat    Keeping feet flat on floor, shoulder width apart, squat as low as is comfortable. Use support as necessary. Repeat _10___ times per set. Do _1___ sets per session. Do 2____ sessions per day.  http://orth.exer.us/736   Copyright  VHI. All rights reserved.  Functional Quadriceps: Sit to Stand    Sit on edge of chair, feet flat on floor. Bring your shoulders over your feet and Stand upright, extending knees fully. Repeat _10___ times per set. Do _1___ sets per session. Do _2___ sessions per day.  http://orth.exer.us/734   Copyright  VHI. All rights reserved.  Strengthening: Quadriceps Set    Tighten muscles on top of thighs by pushing knees down into surface. Hold __5__ seconds. Repeat _10___ times per set. Do __1__ sets per session. Do __2__ sessions per day.  http://orth.exer.us/602   Copyright  VHI. All rights reserved.  Terminal Knee Extension (Standing)    Facing anchor with right knee slightly bent and tubing just above knee, gently pull knee back straight. Do not overextend knee. Repeat __10__ times per set. Do __1__ sets per session. Do _2___ sessions per day.  http://orth.exer.us/666   Copyright  VHI. All rights reserved.

## 2018-07-25 ENCOUNTER — Encounter (HOSPITAL_COMMUNITY): Payer: Self-pay

## 2018-07-25 ENCOUNTER — Ambulatory Visit (HOSPITAL_COMMUNITY): Payer: BC Managed Care – PPO | Attending: Orthopedic Surgery

## 2018-07-25 ENCOUNTER — Other Ambulatory Visit: Payer: Self-pay

## 2018-07-25 DIAGNOSIS — M6281 Muscle weakness (generalized): Secondary | ICD-10-CM | POA: Diagnosis present

## 2018-07-25 DIAGNOSIS — M25562 Pain in left knee: Secondary | ICD-10-CM | POA: Diagnosis not present

## 2018-07-25 DIAGNOSIS — R29898 Other symptoms and signs involving the musculoskeletal system: Secondary | ICD-10-CM | POA: Diagnosis present

## 2018-07-25 DIAGNOSIS — G8929 Other chronic pain: Secondary | ICD-10-CM | POA: Diagnosis present

## 2018-07-25 NOTE — Therapy (Signed)
[facility] [address] [place], [place], [address] Phone: [phone]   Fax:  [phone]  Physical Therapy Treatment  Patient Details  Name: Lindsay James MRN: [id number] Date of Birth: June 30, 1990 Referring Provider (PT): Fuller Canada, MD   Encounter Date: 07/25/2018  PT End of Session - 07/25/18 1504    Visit Number  5    Number of Visits  8    Authorization Type  BCBS Other    Authorization Time Period  05/21/18 to 06/18/18; NEW 07/11/2018-08/08/2018    Authorization - Visit Number  5    Authorization - Number of Visits  30   Pt/OT/Chiro combined   PT Start Time  1429    PT Stop Time  1514    PT Time Calculation (min)  45 min    Activity Tolerance  Patient tolerated treatment well    Behavior During Therapy  [facility] for tasks assessed/performed       Past Medical History:  Diagnosis Date  . Asthma   . Diabetes mellitus without complication (HCC)   . High cholesterol   . Migraine     History reviewed. No pertinent surgical history.  There were no vitals filed for this visit.  Subjective Assessment - 07/25/18 1441    Subjective  Knee pain at work. doign exercies on days off, 2x per week.     Patient Stated Goals  being able to walk better    Currently in Pain?  Yes    Pain Score  8     Pain Onset  More than a month ago        [facility] Adult PT Treatment/Exercise - 07/25/18 0001      Exercises   Exercises  Knee/Hip      Knee/Hip Exercises: Aerobic   Nustep  Hill profile 3: seat 11, Level 3, 8 minutes      Knee/Hip Exercises: Standing   Lateral Step Up  Both;2 sets;10 reps;Hand Hold: 0;Step Height: 6"    Step Down  Both;2 sets;10 reps;Hand Hold: 2;Step Height: 4"    Functional Squat  2 sets;15 reps;Limitations    Functional Squat Limitations  mini-squats at chair    Other Standing Knee Exercises  Hip Hike: 6" step, 1 HHA, 2x 15 reps bill LE      Knee/Hip Exercises: Supine   Bridges  Both;2 sets;15 reps      Knee/Hip Exercises: Sidelying   Clams  Both;2 sets;15 reps        PT Education - 07/25/18 1503    Education Details  Reviewed and consolidated HEP to improve compliance. Educated on purpose of strengthening hips for knee stabilty.    Person(s) Educated  Patient    Methods  Explanation;Handout    Comprehension  Verbalized understanding;Returned demonstration        PT Short Term Goals - 05/27/18 1520      PT SHORT TERM GOAL #1   Title  Pt will be independent with HEP and perform consistently in order to reduce pain and promote return to PLOF.     Time  2    Period  Weeks    Status  On-going    Target Date  06/04/18      PT SHORT TERM GOAL #2   Title  Pt will have improved L ankle DF to neutral (0deg) or better in order to help reduce L knee pain and maximize stair ambulation    Time  2    Period  Weeks    Status  On-going      PT SHORT TERM GOAL #3   Title  Pt will be able to perform bil SLS for 15 sec or > without trunk lean to demo improved core and functional hip strength so as to maximize her gait and stair ambulation.     Time  2    Period  Weeks    Status  On-going        PT Long Term Goals - 05/27/18 1520      PT LONG TERM GOAL #1   Title  Pt will have improved proximal hip strength to 4/5 or > in order to reduce knee pain and improve functional tasks.     Time  4    Period  Weeks    Status  On-going      PT LONG TERM GOAL #2   Title  Pt will be able to perform without pain and with gait WFL to demo improved overall functional strength and maximize her function at work.     Time  4    Period  Weeks    Status  On-going      PT LONG TERM GOAL #3   Title  Pt will be able to perform 10 single leg step downs on standard step height without pain and with mechanics WFL to demo improved functional strength and maximize her community access.     Time  4    Period  Weeks    Status  On-going      PT LONG TERM GOAL #4   Title  Pt will report being able to  stand and walk and perform all work duties for 1 hour or > before onset of her pain to demo improved overall function and maximize her work duties.     Time  4    Period  Weeks    Status  On-going         Plan - 07/25/18 1504    Clinical Impression Statement  Patient arrives reporting 8/10 pain at start of session. She reports her knee felt better after her last session and usually feels better with some exercise. She reports her knee aches at work the most after standing for several hours. This session continued with aerobic exercises to start as it has been shown to reduce pain. Exercises focused on hip strengthening to target weak muscle groups and improve stability of lower kinetic chain. Updated HEP to 4 exercises only to improve patient compliance as she has only been performing them 2x per week. She will continue to benefit from skilled PT interventions to address impairments and progress towards goals.     Stability/Clinical Decision Making  Stable/Uncomplicated    Rehab Potential  Good    PT Frequency  2x / week    PT Duration  4 weeks    PT Treatment/Interventions  ADLs/Self Care Home Management;Cryotherapy;Electrical Stimulation;Aquatic Therapy;Moist Heat;Traction;Ultrasound;DME Instruction;Gait training;Stair training;Functional mobility training;Therapeutic exercise;Balance training;Neuromuscular re-education;Patient/family education;Orthotic Fit/Training;Manual techniques;Scar mobilization;Passive range of motion;Dry needling;Energy conservation;Taping;Joint Manipulations    PT Next Visit Plan   Progress functional, hip, core strenghtening and balance training.    PT Home Exercise Plan  eval: bridging, SLR, sidelying hip abd; 4/ 24:  Functional squats, sit to stand, SLS, standing terminal extension  and quad sets.  UPDATED: bridge, clamsehll, hip hike, minisquat    Consulted and Agree with Plan of Care  Patient       Patient will benefit from skilled  therapeutic intervention in  order to improve the following deficits and impairments:  Abnormal gait, Decreased activity tolerance, Decreased balance, Decreased range of motion, Decreased strength, Difficulty walking, Hypomobility, Increased fascial restricitons, Impaired flexibility, Improper body mechanics, Postural dysfunction, Pain, Obesity  Visit Diagnosis: Chronic pain of left knee  Other symptoms and signs involving the musculoskeletal system  Muscle weakness (generalized)     Problem List Patient Active Problem List   Diagnosis Date Noted  . [redacted] weeks gestation of pregnancy   . Cerebral ventriculomegaly of fetus   . Cervical insufficiency during pregnancy in second trimester, antepartum   . [redacted] weeks gestation of pregnancy     Valentino Saxonachel Quinn-Brown, PT, DPT, [facility] Physical [facility]rapist [facility]  07/25/2018 3:05 PM    [facility] [address] [place] [place], [place], [address] Phone: [phone]   Fax:  [phone]  Name: Lindsay James MRN: [id number] Date of Birth: [birth year]

## 2018-07-25 NOTE — Patient Instructions (Signed)
Mini Squat reps: 15 sets: 3 hold: 3 seconds daily: 1  weekly: 7      Exercise image step 1   Exercise image step 2  Setup  Begin in a standing upright position, with your feet slightly wider than shoulder width apart. Movement  Bend your knees and hips into a mini squat position, then straighten your legs and repeat. Tip  Make sure to keep your back straight and do not let your knees bend forward past your toes. Hip Hiking on Step reps: 15 sets: 2-3 daily: 1 weekly: 7   Exercise image step 1   Exercise image step 2  Setup  Begin standing on a platform, balancing on one leg, with your other foot hanging off the edge.  Movement  Raise one hip to lift your hanging foot off the ground as high as you can, then lower it and repeat.  Tip  Make sure to keep your foot relaxed and use your hip to create the movement. Maintain an upright posture during the exercise.  Disclaimer: This program provides exercises related to your condition that you can perform at home. As there is a risk of injury with any activity, use caution when performing exercises. If you experience any pain or discomfort, discontinue the exercises and contact your health care provider.  Login URL: [email or web address] . Access Code: [email or web address] . Date printed: 07/25/2018 Page 1  Supine Bridge reps: 15 sets: 2-3 hold: 5 seconds daily: 1  weekly: 7      Exercise image step 1   Exercise image step 2  Setup  Begin lying on your back with your arms resting at your sides, your legs bent at the knees and your feet flat on the ground. Movement  Tighten your abdominals and slowly lift your hips off the floor into a bridge position, keeping your back straight. Tip  Make sure to keep your trunk stiff throughout the exercise and your arms flat on the floor. Clamshell with Resistance reps: 15 sets: 2-3 hold: 3 seconds daily: 1  weekly: 7      Exercise image step 1   Exercise image step 2  Setup  Begin by lying on your  side with your knees bent 90 degrees, hips and shoulders stacked, and a resistance loop secured around your legs.  Movement  Raise your top knee away from the bottom one, then slowly return to the starting position.  Tip  Make sure not to roll your hips forward or backward during the exercise.

## 2018-07-31 ENCOUNTER — Telehealth (HOSPITAL_COMMUNITY): Payer: Self-pay | Admitting: General Practice

## 2018-07-31 NOTE — Telephone Encounter (Signed)
07/31/18  pt called to cx but no reason was given

## 2018-08-01 ENCOUNTER — Ambulatory Visit (HOSPITAL_COMMUNITY): Payer: BC Managed Care – PPO

## 2018-08-04 ENCOUNTER — Ambulatory Visit (HOSPITAL_COMMUNITY): Payer: BC Managed Care – PPO | Admitting: Physical Therapy

## 2018-08-04 ENCOUNTER — Telehealth (HOSPITAL_COMMUNITY): Payer: Self-pay | Admitting: Physical Therapy

## 2018-08-04 NOTE — Telephone Encounter (Signed)
Pt did not show for scheduled appointment (1st NS).  Called and left message on voicemail concerning this and reminded of next appointment on Wednesday.   Lurena Nida, PTA/CLT [phone]

## 2018-08-06 ENCOUNTER — Ambulatory Visit (HOSPITAL_COMMUNITY): Payer: BC Managed Care – PPO | Admitting: Physical Therapy

## 2018-08-06 ENCOUNTER — Other Ambulatory Visit: Payer: Self-pay

## 2018-08-06 DIAGNOSIS — M6281 Muscle weakness (generalized): Secondary | ICD-10-CM

## 2018-08-06 DIAGNOSIS — M25562 Pain in left knee: Secondary | ICD-10-CM

## 2018-08-06 DIAGNOSIS — R29898 Other symptoms and signs involving the musculoskeletal system: Secondary | ICD-10-CM

## 2018-08-06 DIAGNOSIS — G8929 Other chronic pain: Secondary | ICD-10-CM

## 2018-08-06 NOTE — Therapy (Signed)
[facility] [address] [place], [place], [address] Phone: [phone]   Fax:  [phone]  Physical Therapy Treatment  Patient Details  Name: Lindsay James MRN: [id number] Date of Birth: 05/19/1990 Referring Provider (PT): Fuller Canada, MD   Encounter Date: 08/06/2018  PT End of Session - 08/06/18 1500    Visit Number  6    Number of Visits  8    Authorization Type  BCBS Other    Authorization Time Period  05/21/18 to 06/18/18; NEW 07/11/2018-08/08/2018    Authorization - Visit Number  6    Authorization - Number of Visits  30   Pt/OT/Chiro combined   PT Start Time  1355    PT Stop Time  1440    PT Time Calculation (min)  45 min    Activity Tolerance  Patient tolerated treatment well    Behavior During Therapy  [facility] for tasks assessed/performed       Past Medical History:  Diagnosis Date  . Asthma   . Diabetes mellitus without complication (HCC)   . High cholesterol   . Migraine     No past surgical history on file.  There were no vitals filed for this visit.  Subjective Assessment - 08/06/18 1401    Subjective  pt states she's just getting off work, origninally said pain 10/10 then after explaination of pain scale, stated it was 8/10.  states she meant to call and cancel her Monday appt due to not having the funds to pay for the visit.      Currently in Pain?  Yes    Pain Score  8     Pain Location  Knee    Pain Orientation  Left;Anterior    Pain Descriptors / Indicators  Aching    Pain Type  Chronic pain    Pain Radiating Towards  into back of heel                       [facility] Adult PT Treatment/Exercise - 08/06/18 0001      Knee/Hip Exercises: Stretches   Gastroc Stretch  Both;3 reps;30 seconds    Gastroc Stretch Limitations  slant board, instructed with wall and step too      Knee/Hip Exercises: Standing   Heel Raises  15 reps    Forward Lunges  Both;10 reps    Lateral Step Up  Both;2  sets;10 reps;Hand Hold: 0;Step Height: 6"    Forward Step Up  Both;2 sets;10 reps;Step Height: 6";Hand Hold: 0    Step Down  Both;2 sets;10 reps;Hand Hold: 2;Step Height: 4"    Functional Squat  2 sets;15 reps;Limitations    Functional Squat Limitations  mini-squats at chair               PT Short Term Goals - 05/27/18 1520      PT SHORT TERM GOAL #1   Title  Pt will be independent with HEP and perform consistently in order to reduce pain and promote return to PLOF.     Time  2    Period  Weeks    Status  On-going    Target Date  06/04/18      PT SHORT TERM GOAL #2   Title  Pt will have improved L ankle DF to neutral (0deg) or better in order to help reduce L knee pain and maximize stair ambulation    Time  2    Period  Weeks    Status  On-going      PT SHORT TERM GOAL #3   Title  Pt will be able to perform bil SLS for 15 sec or > without trunk lean to demo improved core and functional hip strength so as to maximize her gait and stair ambulation.     Time  2    Period  Weeks    Status  On-going        PT Long Term Goals - 05/27/18 1520      PT LONG TERM GOAL #1   Title  Pt will have improved proximal hip strength to 4/5 or > in order to reduce knee pain and improve functional tasks.     Time  4    Period  Weeks    Status  On-going      PT LONG TERM GOAL #2   Title  Pt will be able to perform without pain and with gait WFL to demo improved overall functional strength and maximize her function at work.     Time  4    Period  Weeks    Status  On-going      PT LONG TERM GOAL #3   Title  Pt will be able to perform 10 single leg step downs on standard step height without pain and with mechanics WFL to demo improved functional strength and maximize her community access.     Time  4    Period  Weeks    Status  On-going      PT LONG TERM GOAL #4   Title  Pt will report being able to stand and walk and perform all work duties for 1 hour or > before onset of her  pain to demo improved overall function and maximize her work duties.     Time  4    Period  Weeks    Status  On-going            Plan - 08/06/18 1500    Clinical Impression Statement  discussed appointments moving forward and reports she will only be able to complete 1 visit per week due to finances.  Pt with high pain reports upon entering and after instructing with gastroc stretch stated her pain was literally gone.  Demonstrated runners stretch and also how complete using step to do along with HEP.  Pt without any complaints or issues completing all other exercises.  Demonstrated good eccentric control completing step downs.      Stability/Clinical Decision Making  Stable/Uncomplicated    Rehab Potential  Good    PT Frequency  2x / week    PT Duration  4 weeks    PT Treatment/Interventions  ADLs/Self Care Home Management;Cryotherapy;Electrical Stimulation;Aquatic Therapy;Moist Heat;Traction;Ultrasound;DME Instruction;Gait training;Stair training;Functional mobility training;Therapeutic exercise;Balance training;Neuromuscular re-education;Patient/family education;Orthotic Fit/Training;Manual techniques;Scar mobilization;Passive range of motion;Dry needling;Energy conservation;Taping;Joint Manipulations    PT Next Visit Plan  Complete recert next session and progress functional strength and balance training.     PT Home Exercise Plan  eval: bridging, SLR, sidelying hip abd; 4/ 24:  Functional squats, sit to stand, SLS, standing terminal extension  and quad sets.  UPDATED: bridge, clamsehll, hip hike, minisquat    Consulted and Agree with Plan of Care  Patient       Patient will benefit from skilled therapeutic intervention in order to improve the following deficits and impairments:  Abnormal gait, Decreased activity tolerance, Decreased balance, Decreased range of motion, Decreased strength, Difficulty walking, Hypomobility,  Increased fascial restricitons, Impaired flexibility, Improper  body mechanics, Postural dysfunction, Pain, Obesity  Visit Diagnosis: No diagnosis found.     Problem List Patient Active Problem List   Diagnosis Date Noted  . [redacted] weeks gestation of pregnancy   . Cerebral ventriculomegaly of fetus   . Cervical insufficiency during pregnancy in second trimester, antepartum   . [redacted] weeks gestation of pregnancy    Lindsay James, PTA/CLT [phone]  Lindsay James, Lindsay James 08/06/2018, 3:04 PM  [facility] [address] [place], [place], [address] Phone: [phone]   Fax:  [phone]  Name: Lindsay James MRN: [id number] Date of Birth: 1990/03/26

## 2018-08-13 ENCOUNTER — Other Ambulatory Visit: Payer: Self-pay

## 2018-08-13 ENCOUNTER — Ambulatory Visit (HOSPITAL_COMMUNITY): Payer: BC Managed Care – PPO | Admitting: Physical Therapy

## 2018-08-13 DIAGNOSIS — R29898 Other symptoms and signs involving the musculoskeletal system: Secondary | ICD-10-CM

## 2018-08-13 DIAGNOSIS — G8929 Other chronic pain: Secondary | ICD-10-CM

## 2018-08-13 DIAGNOSIS — M6281 Muscle weakness (generalized): Secondary | ICD-10-CM

## 2018-08-13 DIAGNOSIS — M25562 Pain in left knee: Secondary | ICD-10-CM

## 2018-08-13 NOTE — Therapy (Signed)
Lindsay James, [place], [address] Phone: [phone]   Fax:  [phone]  Physical Therapy Treatment  Progress Note  Reporting Period 05/21/2018  to  08/13/2018  See note below for Objective Data and Assessment of Progress/Goals.   I agree with the below objective assessment and clinical findings performed by Lindsay James, PTA. [name] has been made improvements since returning to therapy after office closure due to COVID-19. She continues to be limited by Lt knee discomfort and will benefit from ongoing skilled PT interventions.   Lindsay James, PT, DPT, [facility] Physical Therapist with [facility]   Patient Details  Name: Lindsay James MRN: [id number] Date of Birth: 1990/07/02 Referring Provider (PT): Arther Abbott, MD   Encounter Date: 08/13/2018  PT End of Session - 08/13/18 1610    Visit Number  7   PN completed visit #7   Number of Visits  12   Authorization Type  BCBS Other    Authorization Time Period 07/11/2018-08/08/2018; NEW: 08/13/18-09/10/18   Authorization - Visit Number  7    Authorization - Number of Visits  30   Pt/OT/Chiro combined   PT Start Time  1520    PT Stop Time  1606    PT Time Calculation (min)  46 min    Activity Tolerance  Patient tolerated treatment well    Behavior During Therapy  [facility] for tasks assessed/performed       Past Medical History:  Diagnosis Date  . Asthma   . Diabetes mellitus without complication ([place])   . High cholesterol   . Migraine     No past surgical history on file.  There were no vitals filed for this visit.  Subjective Assessment - 08/13/18 1632    Subjective  pt states she was doing well without pain yesterday until she went into work at 4 am and pain increased to 9/10 again. states it stayed that high even after tylenol.  States she is working 8-9 hour days and is compliant with HEP.  States her Rt heel began hurting her  on friday for no known reason.     Currently in Pain?  Yes    Pain Score  9     Pain Location  Knee    Pain Orientation  Left    Pain Descriptors / Indicators  Aching    Pain Type  Chronic pain         [facility] PT Assessment - 08/13/18 1600      Assessment   Medical Diagnosis  pes planus of both feet, posterior tibial tendon dysfunction or LLE, patellar instability of L knee    Referring Provider (PT)  Arther Abbott, MD    Onset Date/Surgical Date  --   2.5 years ago it started, worsened over 2-59month   Next MD Visit  unsure    Prior Therapy  none      Prior Function   Level of Independence  Independent    Vocation  Full time employment    Vocation Requirements  works full time at [employer] doing a little bit of everything    Leisure  play games with son and boyfriend, [place] now [place]      Observation/Other Assessments   Observations  extreme pes planus bilaterally      Functional Tests   Functional tests  Step down      Step Down   Comments  bil single leg step downs 7" step: R on step valgus but WNL, L on step: pt demo'd increased knee valgus, pain, and L trunk rotation (due to pain)      Posture/Postural Control   Posture/Postural Control  Postural limitations    Postural Limitations  Rounded Shoulders;Forward head;Increased thoracic kyphosis      AROM   Overall AROM Comments  bil knees are symmetrical via gross assessment    Right Ankle Dorsiflexion  10   was 0 degrees   Right Ankle Plantar Flexion  50   was 50   Right Ankle Inversion  20   was 16 degrees   Right Ankle Eversion  20   was 10 degrees   Left Ankle Dorsiflexion  10   was lacking 3   Left Ankle Plantar Flexion  50   was 45   Left Ankle Inversion  30   was 19 degrees   Left Ankle Eversion  30   was 13 degrees     Strength   Right Hip Flexion  5/5   was 5/5   Right Hip Extension  4/5   4-   Right Hip ABduction  5/5   4   Left Hip Flexion  5/5   4+   Left Hip  Extension  3/5   3-   Left Hip ABduction  4+/5   4   Right Knee Flexion  4+/5   4   Right Knee Extension  5/5   5   Left Knee Flexion  4+/5   4+   Left Knee Extension  4+/5   4+   Right Ankle Dorsiflexion  5/5   5   Left Ankle Dorsiflexion  5/5   5     Ambulation/Gait   Ambulation Distance (Feet)  652 Feet   was 606 feet in 3 minutes   Assistive device  None    Gait Pattern  Step-through pattern;Trendelenburg      Balance   Balance Assessed  Yes      Static Standing Balance   Static Standing - Balance Support  No upper extremity supported    Static Standing Balance -  Activities   Single Leg Stance - Right Leg;Single Leg Stance - Left Leg    Static Standing - Comment/# of Minutes  Rt: 9" (was 13") Lt: 9" (was 5.7 sec)        PT Education - 08/13/18 1637    Education Details  importance of therex, educated on compression hose as these may help due to edema and pain in LE's.  Explained gains and continued weekness, reveiwed goals.     Person(s) Educated  Patient    Methods  Explanation    Comprehension  Verbalized understanding       PT Short Term Goals - 08/13/18 1629      PT SHORT TERM GOAL #1   Title  Pt will be independent with HEP and perform consistently in order to reduce pain and promote return to PLOF.     Time  2    Period  Weeks    Status  Achieved    Target Date  06/04/18      PT SHORT TERM GOAL #2   Title  Pt will have improved L ankle DF to neutral (0deg) or better in order to help reduce L knee pain and maximize stair ambulation    Time  2    Period  Weeks    Status  Achieved  PT SHORT TERM GOAL #3   Title  Pt will be able to perform bil SLS for 15 sec or > without trunk lean to demo improved core and functional hip strength so as to maximize her gait and stair ambulation.     Time  2    Period  Weeks    Status  On-going        PT Long Term Goals - 08/13/18 1629      PT LONG TERM GOAL #1   Title  Pt will have improved proximal hip  strength to 4/5 or > in order to reduce knee pain and improve functional tasks.     Time  4    Period  Weeks    Status  On-going      PT LONG TERM GOAL #2   Title  Pt will be able to perform 3MWT without pain and with gait WFL to demo improved overall functional strength and maximize her function at work.     Baseline  improved without c/o pain    Time  4    Period  Weeks    Status  Partially Met      PT LONG TERM GOAL #3   Title  Pt will be able to perform 10 single leg step downs on standard step height without pain and with mechanics WFL to demo improved functional strength and maximize her community access.     Time  4    Period  Weeks    Status  On-going      PT LONG TERM GOAL #4   Title  Pt will report being able to stand and walk and perform all work duties for 1 hour or > before onset of her pain to demo improved overall function and maximize her work duties.     Time  4    Period  Weeks    Status  On-going            Plan - 08/13/18 1617    Clinical Impression Statement  Re-evaluation completed this session.  Pt has completed 3 sessions prior to 1.5 month closure (due to [phone]) and today is 4th visit since resuming. Pt has made improvements in ankle ROM and LE strength.  Pt mostly having Lt knee discomfort and still having some issues with her Lt ankle.  Also reports some Rt heel pain that just began on Friday for no known cause.  Pt would like to continue therapy at this time to improve remaining deficits.  Pt reports due to financial reasons, she would only be able to come 1X weekly.     Stability/Clinical Decision Making  Stable/Uncomplicated    Rehab Potential  Good    PT Frequency  1x / week    PT Duration  4 weeks    PT Treatment/Interventions  ADLs/Self Care Home Management;Cryotherapy;Electrical Stimulation;Aquatic Therapy;Moist Heat;Traction;Ultrasound;DME Instruction;Gait training;Stair training;Functional mobility training;Therapeutic exercise;Balance  training;Neuromuscular re-education;Patient/family education;Orthotic Fit/Training;Manual techniques;Scar mobilization;Passive range of motion;Dry needling;Energy conservation;Taping;Joint Manipulations    PT Next Visit Plan  Continue to progress functional strength and balance while reducing pain. continue 1X week.    PT Home Exercise Plan  eval: bridging, SLR, sidelying hip abd; 4/ 24:  Functional squats, sit to stand, SLS, standing terminal extension  and quad sets.  UPDATED: bridge, clamsehll, hip hike, minisquat    Consulted and Agree with Plan of Care  Patient       Patient will benefit from skilled therapeutic intervention in order to improve  the following deficits and impairments:  Abnormal gait, Decreased activity tolerance, Decreased balance, Decreased range of motion, Decreased strength, Difficulty walking, Hypomobility, Increased fascial restricitons, Impaired flexibility, Improper body mechanics, Postural dysfunction, Pain, Obesity  Visit Diagnosis: Chronic pain of left knee  Other symptoms and signs involving the musculoskeletal system  Muscle weakness (generalized)     Problem List Patient Active Problem List   Diagnosis Date Noted  . [redacted] weeks gestation of pregnancy   . Cerebral ventriculomegaly of fetus   . Cervical insufficiency during pregnancy in second trimester, antepartum   . [redacted] weeks gestation of pregnancy    Teena Irani, PTA/CLT [phone]  Teena Irani 08/13/2018, 4:38 PM  [facility] [address] [place], [place], [address] Phone: [phone]   Fax:  [phone]  Name: Lindsay James MRN: [id number] Date of Birth: 1990-07-18

## 2018-08-20 ENCOUNTER — Telehealth (HOSPITAL_COMMUNITY): Payer: Self-pay | Admitting: General Practice

## 2018-08-20 ENCOUNTER — Ambulatory Visit (HOSPITAL_COMMUNITY): Payer: BC Managed Care – PPO | Admitting: Physical Therapy

## 2018-08-20 NOTE — Telephone Encounter (Signed)
08/20/18  pt called at 3:31 to cx today's appt... she didn't give a reason as to why she was cancelling

## 2018-08-27 ENCOUNTER — Ambulatory Visit (HOSPITAL_COMMUNITY): Payer: BC Managed Care – PPO | Admitting: Physical Therapy

## 2018-08-27 ENCOUNTER — Telehealth (HOSPITAL_COMMUNITY): Payer: Self-pay | Admitting: General Practice

## 2018-08-27 NOTE — Telephone Encounter (Signed)
08/27/18  pt called to cx said that she has a really bad headache

## 2018-09-01 ENCOUNTER — Encounter (HOSPITAL_COMMUNITY): Payer: Self-pay | Admitting: Adult Health

## 2018-09-01 ENCOUNTER — Other Ambulatory Visit: Payer: Self-pay

## 2018-09-01 ENCOUNTER — Emergency Department (HOSPITAL_COMMUNITY): Payer: BC Managed Care – PPO

## 2018-09-01 ENCOUNTER — Emergency Department (HOSPITAL_COMMUNITY)
Admission: EM | Admit: 2018-09-01 | Discharge: 2018-09-01 | Disposition: A | Discharge status: Home / Self Care | Payer: BC Managed Care – PPO | Attending: Emergency Medicine | Admitting: Emergency Medicine

## 2018-09-01 DIAGNOSIS — F17210 Nicotine dependence, cigarettes, uncomplicated: Secondary | ICD-10-CM | POA: Diagnosis not present

## 2018-09-01 DIAGNOSIS — S99921A Unspecified injury of right foot, initial encounter: Secondary | ICD-10-CM | POA: Diagnosis present

## 2018-09-01 DIAGNOSIS — E119 Type 2 diabetes mellitus without complications: Secondary | ICD-10-CM | POA: Insufficient documentation

## 2018-09-01 DIAGNOSIS — Y998 Other external cause status: Secondary | ICD-10-CM | POA: Diagnosis not present

## 2018-09-01 DIAGNOSIS — J45909 Unspecified asthma, uncomplicated: Secondary | ICD-10-CM | POA: Diagnosis not present

## 2018-09-01 DIAGNOSIS — Y9389 Activity, other specified: Secondary | ICD-10-CM | POA: Insufficient documentation

## 2018-09-01 DIAGNOSIS — S9032XA Contusion of left foot, initial encounter: Secondary | ICD-10-CM | POA: Diagnosis not present

## 2018-09-01 DIAGNOSIS — W208XXA Other cause of strike by thrown, projected or falling object, initial encounter: Secondary | ICD-10-CM | POA: Insufficient documentation

## 2018-09-01 DIAGNOSIS — Y9289 Other specified places as the place of occurrence of the external cause: Secondary | ICD-10-CM | POA: Insufficient documentation

## 2018-09-01 MED ORDER — CELECOXIB 100 MG PO CAPS: 100.0000 mg | ORAL_CAPSULE | Freq: Two times a day (BID) | ORAL | 0 refills | Status: DC

## 2018-09-01 NOTE — Discharge Instructions (Signed)
Contact a health care provider if: °Your symptoms do not improve after several days of treatment. °You have more redness, swelling, or pain in your foot or toes. °You have difficulty moving the injured area. °Your swelling or pain is not relieved with medicines. °Get help right away if: °You have severe pain. °Your foot or toes become numb. °Your foot or toes become pale or cold. °You cannot move your foot or ankle. °Your foot is warm to the touch. °

## 2018-09-01 NOTE — ED Provider Notes (Signed)
[facility] EMERGENCY DEPARTMENT Provider Note   CSN: [id number] Arrival date & time: 09/01/18  1329     History   Chief Complaint Chief Complaint  Patient presents with  . Foot Injury    HPI Lindsay James is a 28 y.o. female history of diabetes, obesity, high cholesterol who presents emergency department with chief complaint of right foot injury.  Patient states that she was at work today when she dropped a large can off of the shelf onto her foot.  She had immediate severe pain and swelling.  She denies numbness or tingling.     HPI  Past Medical History:  Diagnosis Date  . Asthma   . Diabetes mellitus without complication (HCC)   . High cholesterol   . Migraine     Patient Active Problem List   Diagnosis Date Noted  . [redacted] weeks gestation of pregnancy   . Cerebral ventriculomegaly of fetus   . Cervical insufficiency during pregnancy in second trimester, antepartum   . [redacted] weeks gestation of pregnancy     History reviewed. No pertinent surgical history.   OB History    Gravida  1   Para      Term      Preterm      AB      Living  1     SAB      TAB      Ectopic      Multiple      Live Births  1            Home Medications    Prior to Admission medications   Medication Sig Start Date End Date Taking? Authorizing Provider  acetaminophen (TYLENOL) 325 MG tablet Take 325-650 mg by mouth daily as needed for mild pain, moderate pain or headache.     [provider]  celecoxib (CELEBREX) 100 MG capsule Take 1 capsule (100 mg total) by mouth 2 (two) times daily. 09/01/18   Arthor CaptainHarris, Star Cheese, PA-C  cyclobenzaprine (FLEXERIL) 10 MG tablet Take 1 tablet (10 mg total) by mouth 3 (three) times daily as needed. 06/01/18   Triplett, Tammy, PA-C  gabapentin (NEURONTIN) 300 MG capsule Take 300 mg by mouth 3 (three) times daily.    [provider]  HYDROcodone-acetaminophen (NORCO/VICODIN) 5-325 MG tablet Take one tab po q 4 hrs prn pain  06/01/18   Triplett, Tammy, PA-C  ibuprofen (ADVIL,MOTRIN) 200 MG tablet Take 400 mg by mouth every 6 (six) hours as needed for headache or cramping.    [provider]  predniSONE (DELTASONE) 10 MG tablet Take 6 tablets day one, 5 tablets day two, 4 tablets day three, 3 tablets day four, 2 tablets day five, then 1 tablet day six 06/01/18   Triplett, Tammy, PA-C    Family History Family History  Problem Relation Age of Onset  . Diabetes Maternal Grandmother   . Arthritis Mother   . Cancer Father     Social History Social History   Tobacco Use  . Smoking status: Current Some Day Smoker    Packs/day: 0.25    Types: Cigarettes  . Smokeless tobacco: Never Used  Substance Use Topics  . Alcohol use: Yes    Comment: socially  . Drug use: Yes    Types: Marijuana     Allergies   Peanut-containing drug products   Review of Systems Review of Systems  Positive for full gait, foot pain, negative for numbness, weakness Physical Exam Updated Vital Signs BP  138/66 (BP Location: Right Arm)   Pulse 75   Temp 98.3 F (36.8 C) (Oral)   Resp 18   Ht 5\' 4"  (1.626 m)   Wt (!) 143.8 kg   LMP 08/28/2018 (Exact Date)   SpO2 100%   BMI 54.41 kg/m   Physical Exam Vitals signs and nursing note reviewed.  Constitutional:      General: She is not in acute distress.    Appearance: She is well-developed. She is not diaphoretic.  HENT:     Head: Normocephalic and atraumatic.  Eyes:     General: No scleral icterus.    Conjunctiva/sclera: Conjunctivae normal.  Neck:     Musculoskeletal: Normal range of motion.  Cardiovascular:     Rate and Rhythm: Normal rate and regular rhythm.     Heart sounds: Normal heart sounds. No murmur. No friction rub. No gallop.   Pulmonary:     Effort: Pulmonary effort is normal. No respiratory distress.     Breath sounds: Normal breath sounds.  Abdominal:     General: Bowel sounds are normal. There is no distension.     Palpations: Abdomen is  soft. There is no mass.     Tenderness: There is no abdominal tenderness. There is no guarding.  Musculoskeletal:     Comments: Right foot with dorsal surface swelling, bruising, tenderness, negative for numbness or tingling.  Able to move all of her toes and ankle without abnormality, cap refill less than 2  Skin:    General: Skin is warm and dry.  Neurological:     Mental Status: She is alert and oriented to person, place, and time.  Psychiatric:        Behavior: Behavior normal.      ED Treatments / Results  Labs (all labs ordered are listed, but only abnormal results are displayed) Labs Reviewed - No data to display  EKG    Radiology Dg Foot Complete Right  Result Date: 09/01/2018 CLINICAL DATA:  Injury to RIGHT foot this morning, swelling. EXAM: RIGHT FOOT COMPLETE - 3+ VIEW COMPARISON:  None. FINDINGS: There is no evidence of fracture or dislocation. There is no evidence of arthropathy or other focal bone abnormality. Soft tissues are unremarkable. IMPRESSION: Negative. Electronically Signed   By: Bary RichardStan  Maynard M.D.   On: 09/01/2018 13:58    Procedures Procedures (including critical care time)  Medications Ordered in ED Medications - No data to display   Initial Impression / Assessment and Plan / ED Course  I have reviewed the triage vital signs and the nursing notes.  Pertinent labs & imaging results that were available during my care of the patient were reviewed by me and considered in my medical decision making (see chart for details).        Patient X-Ray negative for obvious fracture or dislocation. Pain managed in ED. Pt advised to follow up with orthopedics if symptoms persist for possibility of missed fracture diagnosis. Patient given brace while in ED, conservative therapy recommended and discussed. Patient will be dc home & is agreeable with above plan.   Final Clinical Impressions(s) / ED Diagnoses   Final diagnoses:  Contusion of left foot, initial  encounter    ED Discharge Orders         Ordered    celecoxib (CELEBREX) 100 MG capsule  2 times daily     09/01/18 1503           Arthor CaptainHarris, Alinda Egolf, PA-C 09/01/18 1506    Zackowski,  Nicki Reaper, MD 09/02/18 [phone]

## 2018-09-01 NOTE — ED Triage Notes (Signed)
Presents with injury to right foot. She reports that she dropped a shelf on it this AM. Swelling noted. CMS intact.

## 2018-09-01 NOTE — ED Notes (Signed)
Swelling noted to top of right foot

## 2018-09-03 ENCOUNTER — Telehealth (HOSPITAL_COMMUNITY): Payer: Self-pay | Admitting: General Practice

## 2018-09-03 ENCOUNTER — Ambulatory Visit (HOSPITAL_COMMUNITY): Payer: BC Managed Care – PPO | Admitting: Physical Therapy

## 2018-09-03 NOTE — Telephone Encounter (Signed)
09/03/18  pt called to cx said she did something to her foot on Monday and is wearing a boot.  appt was rescheduled for 6/23

## 2018-09-08 ENCOUNTER — Ambulatory Visit: Payer: BC Managed Care – PPO | Admitting: Podiatry

## 2018-09-08 ENCOUNTER — Encounter: Payer: Self-pay | Admitting: Podiatry

## 2018-09-08 ENCOUNTER — Other Ambulatory Visit: Payer: Self-pay

## 2018-09-08 VITALS — BP 111/63 | HR 96 | Temp 98.1°F

## 2018-09-08 DIAGNOSIS — M778 Other enthesopathies, not elsewhere classified: Secondary | ICD-10-CM

## 2018-09-08 DIAGNOSIS — S9031XA Contusion of right foot, initial encounter: Secondary | ICD-10-CM | POA: Diagnosis not present

## 2018-09-08 DIAGNOSIS — M779 Enthesopathy, unspecified: Secondary | ICD-10-CM | POA: Diagnosis not present

## 2018-09-09 ENCOUNTER — Telehealth (HOSPITAL_COMMUNITY): Payer: Self-pay | Admitting: Physical Therapy

## 2018-09-09 ENCOUNTER — Encounter (HOSPITAL_COMMUNITY): Payer: Self-pay

## 2018-09-09 ENCOUNTER — Ambulatory Visit (HOSPITAL_COMMUNITY): Payer: BC Managed Care – PPO | Attending: Orthopedic Surgery | Admitting: Physical Therapy

## 2018-09-09 NOTE — Therapy (Signed)
[place], [place], [address] Phone: [phone]   Fax:  [phone]  Patient Details  Name: Lindsay James Date of Birth: 1990-06-28 Referring Provider:  No ref. provider found  Encounter Date: 09/09/2018   Pt did not show for scheduled appointment.  Called and spoke to patient and she reports her foot is broken and she is in a cast.  States she is unable to participate in therapy at this time and would like to be discharged.  Explained she would have to be referred again by physician if she wished to return.  Pt verbalized understanding.  Teena Irani, PTA/CLT [phone]   PHYSICAL THERAPY DISCHARGE SUMMARY  Visits from Start of Care: 7  Current functional level related to goals / functional outcomes: See lat note   Remaining deficits: See last note   Education / Equipment: n/a  Plan: Patient agrees to discharge.  Patient goals were partially met. Patient is being discharged due to a change in medical status.  ?????     Geraldine Solar PT, [place] [address] [place], [place], [address] Phone: [phone]   Fax:  [phone]

## 2018-09-09 NOTE — Telephone Encounter (Signed)
Pt did not show for scheduled appointment.  Called and spoke to patient and she reports her foot is broken and she is in a cast.  States she is unable to participate in therapy at this time and would like to be discharged.  Explained she would have to be referred again by physician if she wished to return.  Pt verbalized understanding.  Teena Irani, PTA/CLT [phone]

## 2018-09-10 NOTE — Progress Notes (Signed)
   HPI: 28 year old female with PMHx of diabetes mellitus presenting today as a new patient with a chief complaint of an injury to the dorsal right foot that occurred one week ago. She states she dropped a metal freezer shelf on the foot while she was at work causing immediate pain. She reports associated swelling and bruising. She was seen in the ED the same day and had negative X-Rays. She was prescribed Celebrex, given a brace for the foot and told to follow up for persistent pain. Patient is here for further evaluation and treatment.   Past Medical History:  Diagnosis Date  . Asthma   . Diabetes mellitus without complication ([place])   . High cholesterol   . Migraine      Physical Exam: General: The patient is alert and oriented x3 in no acute distress.  Dermatology: Ecchymosis noted to right dorsal foot. Skin is warm, dry and supple bilateral lower extremities. Negative for open lesions or macerations.  Vascular: Palpable pedal pulses bilaterally. No edema or erythema noted. Capillary refill within normal limits.  Neurological: Epicritic and protective threshold diminished bilaterally.   Musculoskeletal Exam: Pain with palpation noted to the right dorsal foot. Range of motion within normal limits to all pedal and ankle joints bilateral. Muscle strength 5/5 in all groups bilateral.   Assessment: 1. Right foot contusion / capsulitis secondary to injury    Plan of Care:  1. Patient evaluated. X-Rays from ED visit on 09/01/2018 reviewed.  2. CAM boot dispensed. Weightbearing as tolerated.  3. Prescription for Meloxicam provided to patient. 4. Return to clinic in 3 weeks.   Works at [employer].      Edrick Kins, DPM [facility]  Dr. Edrick Kins, DPM    [address], [place] [address]                Office [phone]  Fax [phone]

## 2018-10-06 ENCOUNTER — Other Ambulatory Visit: Payer: Self-pay

## 2018-10-06 ENCOUNTER — Ambulatory Visit (INDEPENDENT_AMBULATORY_CARE_PROVIDER_SITE_OTHER): Payer: BC Managed Care – PPO | Admitting: Podiatry

## 2018-10-06 ENCOUNTER — Encounter: Payer: Self-pay | Admitting: Podiatry

## 2018-10-06 ENCOUNTER — Ambulatory Visit (INDEPENDENT_AMBULATORY_CARE_PROVIDER_SITE_OTHER): Payer: BC Managed Care – PPO

## 2018-10-06 VITALS — Temp 98.1°F

## 2018-10-06 DIAGNOSIS — M779 Enthesopathy, unspecified: Secondary | ICD-10-CM | POA: Diagnosis not present

## 2018-10-06 DIAGNOSIS — M778 Other enthesopathies, not elsewhere classified: Secondary | ICD-10-CM

## 2018-10-08 NOTE — Progress Notes (Signed)
   HPI: 28 year old female with PMHx of diabetes mellitus presenting today for follow up evaluation of right foot pain. She reports some continued soreness and swelling but states she is now able to walk better without the CAM boot. Walking increases the symptoms. She has been taking Meloxicam with some relief. Patient is here for further evaluation and treatment.   Past Medical History:  Diagnosis Date  . Asthma   . Diabetes mellitus without complication ([place])   . High cholesterol   . Migraine      Physical Exam: General: The patient is alert and oriented x3 in no acute distress.  Dermatology: Ecchymosis noted to right dorsal foot. Skin is warm, dry and supple bilateral lower extremities. Negative for open lesions or macerations.  Vascular: Palpable pedal pulses bilaterally. No edema or erythema noted. Capillary refill within normal limits.  Neurological: Epicritic and protective threshold diminished bilaterally.   Musculoskeletal Exam: Pain with palpation noted to the right dorsal foot. Range of motion within normal limits to all pedal and ankle joints bilateral. Muscle strength 5/5 in all groups bilateral.   Radiographic Exam:  Normal osseous mineralization. Joint spaces preserved. No fracture/dislocation/boney destruction.    Assessment: 1. Capsulitis right foot secondary to injury    Plan of Care:  1. Patient evaluated. X-Rays reviewed.  2. Injection of 0.5 mLs Celestone Soluspan injected into the right foot.  3. Continue taking Meloxicam.  4. Continue using CAM boot as needed.  5. Return to clinic in 4 weeks.    Works at [employer].      Edrick Kins, DPM [facility]  Dr. Edrick Kins, DPM    [address], [place] [address]                Office [phone]  Fax [phone]

## 2018-10-13 ENCOUNTER — Other Ambulatory Visit: Payer: BC Managed Care – PPO

## 2018-10-13 ENCOUNTER — Other Ambulatory Visit: Payer: Self-pay

## 2018-10-13 DIAGNOSIS — Z20822 Contact with and (suspected) exposure to covid-19: Secondary | ICD-10-CM

## 2018-10-14 LAB — NOVEL CORONAVIRUS, NAA: SARS-CoV-2, NAA: NOT DETECTED

## 2018-11-03 ENCOUNTER — Ambulatory Visit: Payer: BC Managed Care – PPO | Admitting: Podiatry

## 2018-11-12 ENCOUNTER — Other Ambulatory Visit: Payer: Self-pay

## 2018-11-12 ENCOUNTER — Ambulatory Visit (INDEPENDENT_AMBULATORY_CARE_PROVIDER_SITE_OTHER): Payer: BC Managed Care – PPO | Admitting: Podiatry

## 2018-11-12 ENCOUNTER — Encounter: Payer: Self-pay | Admitting: Podiatry

## 2018-11-12 DIAGNOSIS — M779 Enthesopathy, unspecified: Secondary | ICD-10-CM | POA: Diagnosis not present

## 2018-11-12 DIAGNOSIS — M778 Other enthesopathies, not elsewhere classified: Secondary | ICD-10-CM

## 2018-11-12 MED ORDER — METHYLPREDNISOLONE 4 MG PO TBPK: ORAL_TABLET | ORAL | 0 refills | Status: DC

## 2018-11-15 NOTE — Progress Notes (Signed)
   HPI: 28 year old female with PMHx of diabetes mellitus presenting today for follow up evaluation of right foot pain. She states the pain is relatively unchanged. She has been taking Meloxicam with some relief. She has been using the CAM boot but needs a note for work to use it there. Patient is here for further evaluation and treatment.   Past Medical History:  Diagnosis Date  . Asthma   . Diabetes mellitus without complication ([place])   . High cholesterol   . Migraine      Physical Exam: General: The patient is alert and oriented x3 in no acute distress.  Dermatology: Ecchymosis noted to right dorsal foot. Skin is warm, dry and supple bilateral lower extremities. Negative for open lesions or macerations.  Vascular: Palpable pedal pulses bilaterally. No edema or erythema noted. Capillary refill within normal limits.  Neurological: Epicritic and protective threshold diminished bilaterally.   Musculoskeletal Exam: Pain with palpation noted to the right dorsal foot. Range of motion within normal limits to all pedal and ankle joints bilateral. Muscle strength 5/5 in all groups bilateral.   Assessment: 1. Capsulitis right foot secondary to injury    Plan of Care:  1. Patient evaluated. 2. Prescription for Medrol Dose Pak provided to patient. Then continue taking Meloxicam.  3. Continue using CAM boot for 4 weeks.  4. Return to clinic in 4 weeks.    Works at [employer].      Edrick Kins, DPM [facility]  Dr. Edrick Kins, DPM    [address], [place] [address]                Office [phone]  Fax [phone]

## 2018-11-25 ENCOUNTER — Encounter (HOSPITAL_COMMUNITY): Payer: Self-pay | Admitting: Emergency Medicine

## 2018-11-25 ENCOUNTER — Emergency Department (HOSPITAL_COMMUNITY): Payer: BC Managed Care – PPO

## 2018-11-25 ENCOUNTER — Emergency Department (HOSPITAL_COMMUNITY)
Admission: EM | Admit: 2018-11-25 | Discharge: 2018-11-25 | Disposition: A | Discharge status: Home / Self Care | Payer: BC Managed Care – PPO | Attending: Emergency Medicine | Admitting: Emergency Medicine

## 2018-11-25 ENCOUNTER — Other Ambulatory Visit: Payer: Self-pay

## 2018-11-25 DIAGNOSIS — F17210 Nicotine dependence, cigarettes, uncomplicated: Secondary | ICD-10-CM | POA: Insufficient documentation

## 2018-11-25 DIAGNOSIS — E782 Mixed hyperlipidemia: Secondary | ICD-10-CM | POA: Insufficient documentation

## 2018-11-25 DIAGNOSIS — J45909 Unspecified asthma, uncomplicated: Secondary | ICD-10-CM | POA: Insufficient documentation

## 2018-11-25 DIAGNOSIS — Y9389 Activity, other specified: Secondary | ICD-10-CM | POA: Insufficient documentation

## 2018-11-25 DIAGNOSIS — E119 Type 2 diabetes mellitus without complications: Secondary | ICD-10-CM | POA: Insufficient documentation

## 2018-11-25 DIAGNOSIS — Y929 Unspecified place or not applicable: Secondary | ICD-10-CM | POA: Insufficient documentation

## 2018-11-25 DIAGNOSIS — X58XXXA Exposure to other specified factors, initial encounter: Secondary | ICD-10-CM | POA: Insufficient documentation

## 2018-11-25 DIAGNOSIS — T192XXA Foreign body in vulva and vagina, initial encounter: Secondary | ICD-10-CM | POA: Insufficient documentation

## 2018-11-25 DIAGNOSIS — M25561 Pain in right knee: Secondary | ICD-10-CM | POA: Insufficient documentation

## 2018-11-25 DIAGNOSIS — Y999 Unspecified external cause status: Secondary | ICD-10-CM | POA: Insufficient documentation

## 2018-11-25 LAB — URINALYSIS, ROUTINE W REFLEX MICROSCOPIC
Bacteria, UA: NONE SEEN
Bilirubin Urine: NEGATIVE
Glucose, UA: NEGATIVE mg/dL
Ketones, ur: NEGATIVE mg/dL
Leukocytes,Ua: NEGATIVE
Nitrite: NEGATIVE
Protein, ur: NEGATIVE mg/dL
Specific Gravity, Urine: 1.003 — ABNORMAL LOW (ref 1.005–1.030)
pH: 6 (ref 5.0–8.0)

## 2018-11-25 LAB — WET PREP, GENITAL
Clue Cells Wet Prep HPF POC: NONE SEEN
Sperm: NONE SEEN
Trich, Wet Prep: NONE SEEN
Yeast Wet Prep HPF POC: NONE SEEN

## 2018-11-25 LAB — PREGNANCY, URINE: Preg Test, Ur: NEGATIVE

## 2018-11-25 MED ORDER — NAPROXEN 500 MG PO TABS: 500.0000 mg | ORAL_TABLET | Freq: Two times a day (BID) | ORAL | 0 refills | Status: DC

## 2018-11-25 NOTE — Discharge Instructions (Signed)
Please read and follow all provided instructions.  You have been seen today for right knee pain and a condom in your vagina.   Right knee pain:  An x-ray of the affected area - does NOT show any broken bones or dislocations.   Home care instructions: -- *PRICE in the first 24-48 hours after injury: Protect (with brace, splint, sling), if given by your provider Rest Ice- Do not apply ice pack directly to your skin, place towel or similar between your skin and ice/ice pack. Apply ice for 20 min, then remove for 40 min while awake Compression- Wear brace, elastic bandage, splint as directed by your provider Elevate affected extremity above the level of your heart when not walking around for the first 24-48 hours   Medications:  - Naproxen is a nonsteroidal anti-inflammatory medication that will help with pain and swelling. Be sure to take this medication as prescribed with food, 1 pill every 12 hours,  It should be taken with food, as it can cause stomach upset, and more seriously, stomach bleeding. Do not take other nonsteroidal anti-inflammatory medications with this such as Advil, Motrin, Aleve, Mobic, Goodie Powder, or Motrin.    You make take Tylenol per over the counter dosing with these medications.   We have prescribed you new medication(s) today. Discuss the medications prescribed today with your pharmacist as they can have adverse effects and interactions with your other medicines including over the counter and prescribed medications. Seek medical evaluation if you start to experience new or abnormal symptoms after taking one of these medicines, seek care immediately if you start to experience difficulty breathing, feeling of your throat closing, facial swelling, or rash as these could be indications of a more serious allergic reaction   Follow-up instructions: Please follow-up with your primary care provider or the provided orthopedic physician (bone specialist) if you continue to have  significant pain in 1 week. In this case you may have a more severe injury that requires further care.   Return instructions:  Please return if your digits or extremity are numb or tingling, appear gray or blue, or you have severe pain (also elevate the extremity and loosen splint or wrap if you were given one) Please return if you have redness or fevers.  Please return to the Emergency Department if you experience worsening symptoms.  Please return if you have any other emergent concerns.  Condom in vagina:  The condom was removed.  Your pregnancy test was negative.  Your wet prep did not show yeast, BV, or trich We tested you for gonorrhea/chlamydia and will call you if results are positive- if positive you will need to seek treatment and inform all sexual partners.   Additional Information:  Your vital signs today were: BP 130/81 (BP Location: Right Arm)    Pulse 67    Temp 98.1 F (36.7 C) (Oral)    Resp 16    Ht 5\' 4"  (1.626 m)    Wt (!) 145.2 kg    LMP 11/18/2018    SpO2 100%    BMI 54.93 kg/m  If your blood pressure (BP) was elevated above 135/85 this visit, please have this repeated by your doctor within one month. ---------------

## 2018-11-25 NOTE — ED Triage Notes (Addendum)
Patient complaining of left knee pain "for years" but worsened while at work today. Denies injury. Patient also states "can you tell them I may have a condom stuck in my vagina from last night."

## 2018-11-25 NOTE — ED Provider Notes (Signed)
[facility] EMERGENCY DEPARTMENT Provider Note   CSN: [id number] Arrival date & time: 11/25/18  1741     History   Chief Complaint Chief Complaint  Patient presents with  . Knee Pain  . Foreign Body in Vagina    HPI Lindsay James is a 28 y.o. female with a hx of DM, hypercholesterolemia, migraines, & asthma who presents to the ED w/ complaints of acute on chronic R knee pain today & foreign body in the vagina since yesterday.   Patient states that she has had issues w/ R knee pain for a few years, she felt that the knee was popping/catching throughout the day today increasing her chronic pain. Worse with movement, no alleviating factors, tried OTC meds w/o relief. She is wearing a boot on the R foot for some orthopedic issues and needs to for the next month. Denies color change, warmth, numbness, or weakness. Denies direct trauma to the knee.   Patient also reports concern for FB in vagina. She states she and her boyfriend were having intercourse yesterday via vaginal penetration with a condom. She states she is concerned that the condom remains in her vagina as they were unable to find it. She feels something is there, she tried to get it out on her own without success. States that the vagina feels a bit irritated and burns when she urinates where she was trying to get the condom out. Denies fever, chills, pelvic pain, vaginal discharge, or vaginal bleeding. In a monogamous relationship, not concerned for STDs.      HPI  Past Medical History:  Diagnosis Date  . Asthma   . Diabetes mellitus without complication ([facility])   . High cholesterol   . Migraine     Patient Active Problem List   Diagnosis Date Noted  . [redacted] weeks gestation of pregnancy   . Cerebral ventriculomegaly of fetus   . Cervical insufficiency during pregnancy in second trimester, antepartum   . [redacted] weeks gestation of pregnancy     History reviewed. No pertinent surgical history.   OB History    Gravida  1    Para      Term      Preterm      AB      Living  1     SAB      TAB      Ectopic      Multiple      Live Births  1            Home Medications    Prior to Admission medications   Medication Sig Start Date End Date Taking? Authorizing Provider  acetaminophen (TYLENOL) 325 MG tablet Take 325-650 mg by mouth daily as needed for mild pain, moderate pain or headache.     [provider]  celecoxib (CELEBREX) 100 MG capsule Take 1 capsule (100 mg total) by mouth 2 (two) times daily. 09/01/18   Arthor CaptainHarris, Abigail, PA-C  cyclobenzaprine (FLEXERIL) 10 MG tablet Take 1 tablet (10 mg total) by mouth 3 (three) times daily as needed. 06/01/18   Triplett, Tammy, PA-C  gabapentin (NEURONTIN) 300 MG capsule Take 300 mg by mouth 3 (three) times daily.    [provider]  HYDROcodone-acetaminophen (NORCO/VICODIN) 5-325 MG tablet Take one tab po q 4 hrs prn pain 06/01/18   Triplett, Tammy, PA-C  ibuprofen (ADVIL,MOTRIN) 200 MG tablet Take 400 mg by mouth every 6 (six) hours as needed for headache or cramping.    [provider]  methylPREDNISolone (MEDROL DOSEPAK) 4 MG TBPK tablet 6 day dose pack - take as directed 11/12/18   Felecia Shelling, DPM  predniSONE (DELTASONE) 10 MG tablet Take 6 tablets day one, 5 tablets day two, 4 tablets day three, 3 tablets day four, 2 tablets day five, then 1 tablet day six 06/01/18   Pauline Aus, PA-C    Family History Family History  Problem Relation Age of Onset  . Diabetes Maternal Grandmother   . Arthritis Mother   . Cancer Father     Social History Social History   Tobacco Use  . Smoking status: Current Some Day Smoker    Packs/day: 0.25    Types: Cigarettes  . Smokeless tobacco: Never Used  Substance Use Topics  . Alcohol use: Yes    Comment: socially  . Drug use: Yes    Types: Marijuana     Allergies   Peanut-containing drug products   Review of Systems Review of Systems  Constitutional: Negative for  chills and fever.  Respiratory: Negative for shortness of breath.   Cardiovascular: Negative for chest pain.  Gastrointestinal: Negative for abdominal pain, blood in stool, constipation, diarrhea, nausea and vomiting.  Genitourinary: Positive for dysuria (@ introitus). Negative for frequency, hematuria, pelvic pain, urgency, vaginal bleeding and vaginal discharge.  Musculoskeletal: Positive for arthralgias (R knee).  Skin: Negative for color change, rash and wound.  All other systems reviewed and are negative.    Physical Exam Updated Vital Signs BP 130/81 (BP Location: Right Arm)   Pulse 67   Temp 98.1 F (36.7 C) (Oral)   Resp 16   Ht 5\' 4"  (1.626 m)   Wt (!) 145.2 kg   LMP 11/18/2018   SpO2 100%   BMI 54.93 kg/m   Physical Exam Vitals signs and nursing note reviewed. Exam conducted with a chaperone present.  Constitutional:      General: She is not in acute distress.    Appearance: She is not ill-appearing or toxic-appearing.  HENT:     Head: Normocephalic and atraumatic.  Cardiovascular:     Rate and Rhythm: Normal rate and regular rhythm.     Pulses:          Dorsalis pedis pulses are 2+ on the right side and 2+ on the left side.       Posterior tibial pulses are 2+ on the right side and 2+ on the left side.  Pulmonary:     Effort: Pulmonary effort is normal.     Breath sounds: Normal breath sounds.  Abdominal:     General: There is no distension.     Palpations: Abdomen is soft.     Tenderness: There is no abdominal tenderness. There is no guarding or rebound.  Genitourinary:    Labia:        Right: No lesion.        Left: No lesion.      Vagina: Foreign body (Speculum inserted with visualization of condom adjacent to the cervix. ) present. Vaginal discharge (mild white) present.     Cervix: No cervical motion tenderness.     Adnexa:        Right: No mass, tenderness or fullness.         Left: No mass, tenderness or fullness.       Comments:  RN Dominica Severin  present as chaperone.  Musculoskeletal:     Comments: Lower extremities: No obvious deformity, appreciable swelling, edema, erythema, ecchymosis, warmth, or open wounds.  Patient in R foot boot- removed for full exam. Patient has intact AROM to bilateral hips, knees, ankles, and all digits. Tender to palpation diffusely to the R knee.   Skin:    General: Skin is warm and dry.     Capillary Refill: Capillary refill takes less than 2 seconds.  Neurological:     Mental Status: She is alert.     Comments: Alert. Clear speech. Sensation grossly intact to bilateral lower extremities. 5/5 strength with plantar/dorsiflexion bilaterally. Patient ambulatory.   Psychiatric:        Mood and Affect: Mood normal.        Behavior: Behavior normal.      ED Treatments / Results  Labs (all labs ordered are listed, but only abnormal results are displayed) Labs Reviewed - No data to display  EKG None  Radiology Dg Knee Complete 4 Views Left  Result Date: 11/25/2018 CLINICAL DATA:  Left knee pain EXAM: LEFT KNEE - COMPLETE 4+ VIEW COMPARISON:  None. FINDINGS: No fracture or dislocation. Normal bone mineralization seen throughout. No large knee joint effusion. IMPRESSION: No acute osseous abnormality. Electronically Signed   By: Prudencio Pair M.D.   On: 11/25/2018 19:58    Procedures .Foreign Body Removal  Date/Time: 11/25/2018 9:58 PM Performed by: Amaryllis Dyke, PA-C Authorized by: Amaryllis Dyke, PA-C  Consent: Verbal consent obtained. Risks and benefits: risks, benefits and alternatives were discussed Consent given by: patient Patient understanding: patient states understanding of the procedure being performed Required items: required blood products, implants, devices, and special equipment available Body area: vagina  Sedation: Patient sedated: no  Localization method: speculum Removal mechanism: forceps Complexity: simple Post-procedure assessment: foreign body removed  Patient tolerance: patient tolerated the procedure well with no immediate complications   (including critical care time)  Medications Ordered in ED Medications - No data to display   Initial Impression / Assessment and Plan / ED Course  I have reviewed the triage vital signs and the nursing notes.  Pertinent labs & imaging results that were available during my care of the patient were reviewed by me and considered in my medical decision making (see chart for details).    Patient presents to the ED w/ complaints of acute on chronic R knee pain & FB in vagina. Nontoxic appearing, vitals w/o significant abnormality.  - R knee pain: No fever/erythema/warmth- not consistent w/ septic joint. Xray w/o fracture/dislocation. No obvious instability. Will apply knee sleeve, trial naproxen, orthopedics follow up.  - FB in vagina: Identified and removed per procedure note above, tolerated well. Preg test negative, UA w/o UTI, Wet prep w/ moderate WBCs- no trich/yeast/BV. GC/chlamydia pending, exam not consistent w/ PID. Condom present in vagina for < 24 hours, dicussed w/ Dr. Wilson Singer do not feel abx are necessary he is in agreement.   I discussed results, treatment plan, need for follow-up, and return precautions with the patient. Provided opportunity for questions, patient confirmed understanding and is in agreement with plan.      Final Clinical Impressions(s) / ED Diagnoses   Final diagnoses:  Right knee pain, unspecified chronicity  Foreign body in vagina, initial encounter    ED Discharge Orders         Ordered    naproxen (NAPROSYN) 500 MG tablet  2 times daily     11/25/18 2139           Amaryllis Dyke, PA-C 11/25/18 2201    Virgel Manifold, MD 11/30/18 1235

## 2018-11-27 LAB — GC/CHLAMYDIA PROBE AMP (~~LOC~~) NOT AT ARMC
Chlamydia: POSITIVE — AB
Neisseria Gonorrhea: POSITIVE — AB

## 2018-11-29 ENCOUNTER — Other Ambulatory Visit: Payer: Self-pay

## 2018-11-29 ENCOUNTER — Encounter (HOSPITAL_COMMUNITY): Payer: Self-pay | Admitting: Emergency Medicine

## 2018-11-29 ENCOUNTER — Emergency Department (HOSPITAL_COMMUNITY)
Admission: EM | Admit: 2018-11-29 | Discharge: 2018-11-29 | Disposition: A | Discharge status: Home / Self Care | Payer: BC Managed Care – PPO | Attending: Emergency Medicine | Admitting: Emergency Medicine

## 2018-11-29 DIAGNOSIS — F17210 Nicotine dependence, cigarettes, uncomplicated: Secondary | ICD-10-CM | POA: Insufficient documentation

## 2018-11-29 DIAGNOSIS — Z79899 Other long term (current) drug therapy: Secondary | ICD-10-CM | POA: Insufficient documentation

## 2018-11-29 DIAGNOSIS — A749 Chlamydial infection, unspecified: Secondary | ICD-10-CM | POA: Insufficient documentation

## 2018-11-29 DIAGNOSIS — A549 Gonococcal infection, unspecified: Secondary | ICD-10-CM | POA: Insufficient documentation

## 2018-11-29 DIAGNOSIS — E119 Type 2 diabetes mellitus without complications: Secondary | ICD-10-CM | POA: Insufficient documentation

## 2018-11-29 DIAGNOSIS — J45909 Unspecified asthma, uncomplicated: Secondary | ICD-10-CM | POA: Insufficient documentation

## 2018-11-29 DIAGNOSIS — Z91010 Allergy to peanuts: Secondary | ICD-10-CM | POA: Insufficient documentation

## 2018-11-29 HISTORY — DX: Gonococcal infection, unspecified: A54.9

## 2018-11-29 HISTORY — DX: Chlamydial infection, unspecified: A74.9

## 2018-11-29 MED ORDER — LIDOCAINE HCL (PF) 1 % IJ SOLN
INTRAMUSCULAR | Status: AC
  Administered 2018-11-29: 2 mL
  Filled 2018-11-29: qty 2

## 2018-11-29 MED ORDER — CEFTRIAXONE SODIUM 250 MG IJ SOLR
250.0000 mg | Freq: Once | INTRAMUSCULAR | Status: AC
  Administered 2018-11-29: 250 mg via INTRAMUSCULAR
  Filled 2018-11-29: qty 250

## 2018-11-29 MED ORDER — AZITHROMYCIN 250 MG PO TABS
1000.0000 mg | ORAL_TABLET | Freq: Once | ORAL | Status: AC
  Administered 2018-11-29: 1000 mg via ORAL
  Filled 2018-11-29: qty 4

## 2018-11-29 NOTE — ED Provider Notes (Signed)
[facility] EMERGENCY DEPARTMENT Provider Note   CSN: [id number] Arrival date & time: 11/29/18  1502     History   Chief Complaint Chief Complaint  Patient presents with  . SEXUALLY TRANSMITTED DISEASE    HPI Lindsay James is a 28 y.o. female.     Patient presents the emergency department today for gonorrhea and chlamydia treatment.  Patient reports that she was told by her partner several days ago that he was positive for gonorrhea and chlamydia.  She reports that she got tested on the eighth of this month but did not get treatment at that time because she was not having any symptoms.  Reports that her test results came back positive for both gonorrhea and chlamydia and she would like to get treatment today.  She is having some vaginal discharge but no pelvic pain, dysuria, flank pain, fevers.  Denies any other symptoms.     Past Medical History:  Diagnosis Date  . Asthma   . Chlamydia   . Diabetes mellitus without complication ([place])   . Gonorrhea   . High cholesterol   . Migraine     Patient Active Problem List   Diagnosis Date Noted  . [redacted] weeks gestation of pregnancy   . Cerebral ventriculomegaly of fetus   . Cervical insufficiency during pregnancy in second trimester, antepartum   . [redacted] weeks gestation of pregnancy     History reviewed. No pertinent surgical history.   OB History    Gravida  1   Para      Term      Preterm      AB      Living  1     SAB      TAB      Ectopic      Multiple      Live Births  1            Home Medications    Prior to Admission medications   Medication Sig Start Date End Date Taking? Authorizing Provider  Biotin w/ Vitamins C & E (HAIR/SKIN/NAILS PO) Take 1 tablet by mouth daily.   Yes [provider]  celecoxib (CELEBREX) 100 MG capsule Take 1 capsule (100 mg total) by mouth 2 (two) times daily. 09/01/18  Yes Harris, Abigail, PA-C  cyclobenzaprine (FLEXERIL) 10 MG tablet Take 1 tablet (10 mg  total) by mouth 3 (three) times daily as needed. Patient taking differently: Take 10 mg by mouth 3 (three) times daily as needed for muscle spasms.  06/01/18  Yes Triplett, Tammy, PA-C  ibuprofen (ADVIL,MOTRIN) 200 MG tablet Take 400 mg by mouth every 6 (six) hours as needed for headache or cramping.   Yes [provider]  methylPREDNISolone (MEDROL DOSEPAK) 4 MG TBPK tablet 6 day dose pack - take as directed Patient not taking: Reported on 11/29/2018 11/12/18   Edrick Kins, DPM  naproxen (NAPROSYN) 500 MG tablet Take 1 tablet (500 mg total) by mouth 2 (two) times daily. 11/25/18   Petrucelli, Samantha R, PA-C  predniSONE (DELTASONE) 10 MG tablet Take 6 tablets day one, 5 tablets day two, 4 tablets day three, 3 tablets day four, 2 tablets day five, then 1 tablet day six 06/01/18   Triplett, Tammy, PA-C    Family History Family History  Problem Relation Age of Onset  . Diabetes Maternal Grandmother   . Arthritis Mother   . Cancer Father     Social History Social History   Tobacco Use  .  Smoking status: Current Some Day Smoker    Packs/day: 0.25    Types: Cigarettes  . Smokeless tobacco: Never Used  Substance Use Topics  . Alcohol use: Yes    Comment: socially  . Drug use: Yes    Types: Marijuana     Allergies   Peanut-containing drug products   Review of Systems Review of Systems  Constitutional: Negative for fever.  Gastrointestinal: Negative for abdominal pain, nausea and vomiting.  Genitourinary: Positive for vaginal discharge. Negative for decreased urine volume, difficulty urinating, dyspareunia, dysuria, enuresis, flank pain, frequency, genital sores, hematuria, menstrual problem, pelvic pain, urgency, vaginal bleeding and vaginal pain.  Musculoskeletal: Negative for back pain.  Allergic/Immunologic: Negative for immunocompromised state.  All other systems reviewed and are negative.    Physical Exam Updated Vital Signs BP (!) 129/57 (BP Location: Right  Arm)   Pulse 97   Temp 98.6 F (37 C) (Oral)   Resp 14   Ht 5\' 4"  (1.626 m)   Wt (!) 145.2 kg   LMP 11/18/2018   SpO2 100%   BMI 54.93 kg/m   Physical Exam Vitals signs and nursing note reviewed.  Constitutional:      Appearance: Normal appearance.  HENT:     Head: Normocephalic.  Eyes:     Conjunctiva/sclera: Conjunctivae normal.  Pulmonary:     Effort: Pulmonary effort is normal.  Skin:    General: Skin is dry.  Neurological:     Mental Status: She is alert.  Psychiatric:        Mood and Affect: Mood normal.      ED Treatments / Results  Labs (all labs ordered are listed, but only abnormal results are displayed) Labs Reviewed - No data to display  EKG None  Radiology No results found.  Procedures Procedures (including critical care time)  Medications Ordered in ED Medications  cefTRIAXone (ROCEPHIN) injection 250 mg (250 mg Intramuscular Given 11/29/18 1727)  azithromycin (ZITHROMAX) tablet 1,000 mg (1,000 mg Oral Given 11/29/18 1727)  lidocaine (PF) (XYLOCAINE) 1 % injection (2 mLs  Given 11/29/18 1727)     Initial Impression / Assessment and Plan / ED Course  I have reviewed the triage vital signs and the nursing notes.  Pertinent labs & imaging results that were available during my care of the patient were reviewed by me and considered in my medical decision making (see chart for details).        Based on review of vitals, medical screening exam, lab work and/or imaging, there does not appear to be an acute, emergent etiology for the patient's symptoms. Counseled pt on good return precautions and encouraged both PCP and ED follow-up as needed.  Prior to discharge, I also discussed incidental imaging findings with patient in detail and advised appropriate, recommended follow-up in detail.  Clinical Impression: 1. Gonorrhea   2. Chlamydia     Disposition: Discharge  Prior to providing a prescription for a controlled substance, I independently  reviewed the patient's recent prescription history on the [place] Controlled Substance Reporting System. The patient had no recent or regular prescriptions and was deemed appropriate for a brief, less than 3 day prescription of narcotic for acute analgesia.  This note was prepared with assistance of Conservation officer, historic buildings. Occasional wrong-word or sound-a-like substitutions may have occurred due to the inherent limitations of voice recognition software.   Final Clinical Impressions(s) / ED Diagnoses   Final diagnoses:  Gonorrhea  Chlamydia    ED Discharge Orders  None       Jeral PinchMcLean, Lonny Eisen A, PA-C 11/29/18 1743    Vanetta MuldersZackowski, Scott, MD 12/13/18 [phone]

## 2018-11-29 NOTE — Discharge Instructions (Addendum)
Follow-up with the health department for retesting in 1 to 2 weeks.  No sex for 1 to 2 weeks and until you know that you are negative.  Return if you have any new or worsening symptoms.

## 2018-11-29 NOTE — ED Triage Notes (Signed)
Pt here for treatment of gonorrhea and chlamydia. Pt states she was tested in [facility] last week and she just received results. Pt c/o increase in vaginal discharge.

## 2018-12-10 ENCOUNTER — Ambulatory Visit: Payer: BC Managed Care – PPO | Admitting: Podiatry

## 2019-02-05 ENCOUNTER — Emergency Department (HOSPITAL_COMMUNITY)
Admission: EM | Admit: 2019-02-05 | Discharge: 2019-02-05 | Disposition: A | Discharge status: Home / Self Care | Payer: Self-pay | Attending: Emergency Medicine | Admitting: Emergency Medicine

## 2019-02-05 ENCOUNTER — Emergency Department (HOSPITAL_COMMUNITY): Payer: Self-pay

## 2019-02-05 ENCOUNTER — Other Ambulatory Visit: Payer: Self-pay

## 2019-02-05 ENCOUNTER — Encounter (HOSPITAL_COMMUNITY): Payer: Self-pay | Admitting: *Deleted

## 2019-02-05 DIAGNOSIS — Z79899 Other long term (current) drug therapy: Secondary | ICD-10-CM | POA: Insufficient documentation

## 2019-02-05 DIAGNOSIS — Z91010 Allergy to peanuts: Secondary | ICD-10-CM | POA: Insufficient documentation

## 2019-02-05 DIAGNOSIS — M79671 Pain in right foot: Secondary | ICD-10-CM | POA: Insufficient documentation

## 2019-02-05 DIAGNOSIS — E119 Type 2 diabetes mellitus without complications: Secondary | ICD-10-CM | POA: Insufficient documentation

## 2019-02-05 DIAGNOSIS — F17210 Nicotine dependence, cigarettes, uncomplicated: Secondary | ICD-10-CM | POA: Insufficient documentation

## 2019-02-05 DIAGNOSIS — J45909 Unspecified asthma, uncomplicated: Secondary | ICD-10-CM | POA: Insufficient documentation

## 2019-02-05 NOTE — Discharge Instructions (Addendum)
You may alternate taking Tylenol and Ibuprofen as needed for pain control. You may take 400-600 mg of ibuprofen every 6 hours and [phone] mg of Tylenol every 6 hours. Do not exceed 4000 mg of Tylenol daily as this can lead to liver damage. Also, make sure to take Ibuprofen with meals as it can cause an upset stomach. Do not take other NSAIDs while taking Ibuprofen such as (Aleve, Naprosyn, Aspirin, Celebrex, etc) and do not take more than the prescribed dose as this can lead to ulcers and bleeding in your GI tract. You may use warm and cold compresses to help with your symptoms.   Please follow up with your podiatrist within the next 7-10 days for re-evaluation and further treatment of your symptoms.   Please return to the ER sooner if you have any new or worsening symptoms.

## 2019-02-05 NOTE — ED Provider Notes (Addendum)
[facility] EMERGENCY DEPARTMENT Provider Note   CSN: [id number] Arrival date & time: 02/05/19  1229     History   Chief Complaint Chief Complaint  Patient presents with  . Foot Pain    HPI Lindsay James is a 28 y.o. female.     HPI   28 year old female with a history of asthma, STIs, diabetes, hyperlipidemia, who presents to the emergency department today for evaluation of right foot pain.  States she had an injury to her foot back in June when something fell on the top of her foot.  She reports having a fracture at that time and being given a boot to wear.  Since then she has consistently had pain to the right foot but it does help when she wears the boot.  She is here today because her job said that she has to have a note to continue wearing the boot at work.  She had been following up with podiatry and being tx for capsulitis, but she was unable to make all of her follow-up appointments due to outside circumstances.  Past Medical History:  Diagnosis Date  . Asthma   . Chlamydia   . Diabetes mellitus without complication (HCC)   . Gonorrhea   . High cholesterol   . Migraine     Patient Active Problem List   Diagnosis Date Noted  . [redacted] weeks gestation of pregnancy   . Cerebral ventriculomegaly of fetus   . Cervical insufficiency during pregnancy in second trimester, antepartum   . [redacted] weeks gestation of pregnancy     History reviewed. No pertinent surgical history.   OB History    Gravida  1   Para      Term      Preterm      AB      Living  1     SAB      TAB      Ectopic      Multiple      Live Births  1            Home Medications    Prior to Admission medications   Medication Sig Start Date End Date Taking? Authorizing Provider  Biotin w/ Vitamins C & E (HAIR/SKIN/NAILS PO) Take 1 tablet by mouth daily.    [provider]  celecoxib (CELEBREX) 100 MG capsule Take 1 capsule (100 mg total) by mouth 2 (two) times daily.  09/01/18   Arthor CaptainHarris, Abigail, PA-C  cyclobenzaprine (FLEXERIL) 10 MG tablet Take 1 tablet (10 mg total) by mouth 3 (three) times daily as needed. Patient taking differently: Take 10 mg by mouth 3 (three) times daily as needed for muscle spasms.  06/01/18   Triplett, Tammy, PA-C  ibuprofen (ADVIL,MOTRIN) 200 MG tablet Take 400 mg by mouth every 6 (six) hours as needed for headache or cramping.    [provider]  methylPREDNISolone (MEDROL DOSEPAK) 4 MG TBPK tablet 6 day dose pack - take as directed Patient not taking: Reported on 11/29/2018 11/12/18   Felecia ShellingEvans, Brent M, DPM  naproxen (NAPROSYN) 500 MG tablet Take 1 tablet (500 mg total) by mouth 2 (two) times daily. 11/25/18   Petrucelli, Samantha R, PA-C  predniSONE (DELTASONE) 10 MG tablet Take 6 tablets day one, 5 tablets day two, 4 tablets day three, 3 tablets day four, 2 tablets day five, then 1 tablet day six 06/01/18   Pauline Ausriplett, Tammy, PA-C    Family History Family History  Problem Relation Age  of Onset  . Diabetes Maternal Grandmother   . Arthritis Mother   . Cancer Father     Social History Social History   Tobacco Use  . Smoking status: Current Some Day Smoker    Packs/day: 0.25    Types: Cigarettes  . Smokeless tobacco: Never Used  Substance Use Topics  . Alcohol use: Yes    Comment: socially  . Drug use: Yes    Types: Marijuana     Allergies   Peanut-containing drug products   Review of Systems Review of Systems  Constitutional: Negative for fever.  Musculoskeletal:       Right foot pain  Skin: Negative for color change.  Neurological: Negative for numbness.     Physical Exam Updated Vital Signs BP (!) 133/114 (BP Location: Right Arm)   Pulse 69   Temp 98.9 F (37.2 C) (Oral)   Resp 18   Ht 5\' 4"  (1.626 m)   Wt 133.8 kg   LMP 01/01/2019   SpO2 100%   BMI 50.64 kg/m   Physical Exam Vitals signs and nursing note reviewed.  Constitutional:      General: She is not in acute distress.     Appearance: She is well-developed.  HENT:     Head: Normocephalic and atraumatic.  Eyes:     Conjunctiva/sclera: Conjunctivae normal.  Neck:     Musculoskeletal: Neck supple.  Cardiovascular:     Rate and Rhythm: Normal rate.  Pulmonary:     Effort: Pulmonary effort is normal.  Musculoskeletal: Normal range of motion.     Comments: TTP to the right lateral foot and to the dorsum of the foot. No erythema, warmth, or obvious asymmetric swelling. Normal pulses and temperature. No calf TTP or edema. Normal ROM.  Skin:    General: Skin is warm and dry.  Neurological:     Mental Status: She is alert.      ED Treatments / Results  Labs (all labs ordered are listed, but only abnormal results are displayed) Labs Reviewed - No data to display  EKG None  Radiology Dg Foot Complete Right  Result Date: 02/05/2019 CLINICAL DATA:  Rack fell on foot June, wearing boot from June to September EXAM: RIGHT FOOT COMPLETE - 3+ VIEW COMPARISON:  Radiographs October 06, 2018, September 01, 2018 FINDINGS: No acute bony abnormality. Specifically, no fracture, subluxation, or dislocation. Midfoot and hindfoot alignment is grossly preserved though incompletely assessed on nonweightbearing films. Grossly normal alignment of the ankle. No sizable effusion. No soft tissue gas or foreign body. Corticated accessory navicular is present. IMPRESSION: No acute or healing fractures are identified. Electronically Signed   By: September 03, 2018 M.D.   On: 02/05/2019 13:36    Procedures Procedures (including critical care time)  Medications Ordered in ED Medications - No data to display   Initial Impression / Assessment and Plan / ED Course  I have reviewed the triage vital signs and the nursing notes.  Pertinent labs & imaging results that were available during my care of the patient were reviewed by me and considered in my medical decision making (see chart for details).    Final Clinical Impressions(s) / ED  Diagnoses   Final diagnoses:  Foot pain, right   28 year old female presenting for evaluation of right foot pain ongoing for the last 5 months after an injury that occurred after something fell into her foot.  Had been wearing a boot which was helping her symptoms however she has not been allowed  to wear it at work and needs a note to continue wearing it.  She has not been able to follow-up with orthopedics in some time.  We will repeat x-ray to rule out stress fracture or other abnormality.  X-ray showed obvious fractures or other acute bony abnormality.  Suspect sxs related to her h/o capsulitis. Attempted to give patient new cam walker as she states hers is broken however cam walker here would not fit her. Gave post op shoe. Crutches were offered but pt declined.  Encouraged her to follow-up with podiatry for further evaluation and management.  Advised on return precautions.  She voices understanding and is in agree with plan.  All questions answered.  Patient stable for discharge.  ED Discharge Orders    None       Rodney Booze, PA-C 02/05/19 1345    Rodney Booze, PA-C 02/05/19 1410    Milton Ferguson, MD 02/05/19 1431

## 2019-02-05 NOTE — ED Triage Notes (Signed)
Pt with continued right foot pain after a rack fell on her foot back in June. Left ankle swollen and left leg pain since as well.  Pt states she was wearing a boot from June to September. 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  Pt missed her f/u appt. Pt denies a new appt to f/u.

## 2019-03-02 ENCOUNTER — Ambulatory Visit: Payer: Medicaid Other | Admitting: Podiatry

## 2019-03-02 ENCOUNTER — Other Ambulatory Visit: Payer: Self-pay

## 2019-03-02 ENCOUNTER — Encounter: Payer: Self-pay | Admitting: Podiatry

## 2019-03-04 ENCOUNTER — Encounter (HOSPITAL_COMMUNITY): Payer: Self-pay | Admitting: Emergency Medicine

## 2019-03-04 ENCOUNTER — Emergency Department (HOSPITAL_COMMUNITY): Payer: Self-pay

## 2019-03-04 ENCOUNTER — Emergency Department (HOSPITAL_COMMUNITY)
Admission: EM | Admit: 2019-03-04 | Discharge: 2019-03-04 | Disposition: A | Discharge status: Home / Self Care | Payer: Self-pay | Attending: Emergency Medicine | Admitting: Emergency Medicine

## 2019-03-04 ENCOUNTER — Other Ambulatory Visit: Payer: Self-pay

## 2019-03-04 DIAGNOSIS — S63502A Unspecified sprain of left wrist, initial encounter: Secondary | ICD-10-CM | POA: Insufficient documentation

## 2019-03-04 DIAGNOSIS — Y92511 Restaurant or cafe as the place of occurrence of the external cause: Secondary | ICD-10-CM | POA: Insufficient documentation

## 2019-03-04 DIAGNOSIS — E119 Type 2 diabetes mellitus without complications: Secondary | ICD-10-CM | POA: Insufficient documentation

## 2019-03-04 DIAGNOSIS — W010XXA Fall on same level from slipping, tripping and stumbling without subsequent striking against object, initial encounter: Secondary | ICD-10-CM | POA: Insufficient documentation

## 2019-03-04 DIAGNOSIS — Y990 Civilian activity done for income or pay: Secondary | ICD-10-CM | POA: Insufficient documentation

## 2019-03-04 DIAGNOSIS — W19XXXA Unspecified fall, initial encounter: Secondary | ICD-10-CM

## 2019-03-04 DIAGNOSIS — Y9389 Activity, other specified: Secondary | ICD-10-CM | POA: Insufficient documentation

## 2019-03-04 DIAGNOSIS — F17210 Nicotine dependence, cigarettes, uncomplicated: Secondary | ICD-10-CM | POA: Insufficient documentation

## 2019-03-04 DIAGNOSIS — J45909 Unspecified asthma, uncomplicated: Secondary | ICD-10-CM | POA: Insufficient documentation

## 2019-03-04 DIAGNOSIS — Z79899 Other long term (current) drug therapy: Secondary | ICD-10-CM | POA: Insufficient documentation

## 2019-03-04 MED ORDER — IBUPROFEN 800 MG PO TABS
800.0000 mg | ORAL_TABLET | Freq: Once | ORAL | Status: AC
  Administered 2019-03-04: 11:00:00 800 mg via ORAL
  Filled 2019-03-04: qty 1

## 2019-03-04 MED ORDER — NAPROXEN 500 MG PO TABS: 500.0000 mg | ORAL_TABLET | Freq: Two times a day (BID) | ORAL | 0 refills | Status: DC

## 2019-03-04 NOTE — Discharge Instructions (Signed)
Your x-ray shows no signs of broken bones, this is likely a bruise or sprain, it will get better over the week.  Naproxen as needed for pain twice a day  [facility] Doctor List    Sinda Du MD. Specialty: Pulmonary Disease Contact information: [address]  [place] [address]  [phone]   Tula Nakayama, MD. Specialty: [facility] Medicine Contact information: [address], [place] [address]  [phone]   Sallee Lange, MD. Specialty: [facility] Medicine Contact information: [address]  [place] [address]  [phone]   Rosita Fire, MD Specialty: Internal Medicine Contact information: [place] [address]  [phone]   Delphina Cahill, MD. Specialty: Internal Medicine Contact information: [place] [address]  [phone]    [facility] (Dr. Maudie Mercury) Specialty: Family Medicine Contact information: [place] [address]  [phone]   Leslie Andrea, MD. Specialty: [facility] Medicine Contact information: [place] [address]  [phone]   Asencion Noble, MD. Specialty: Internal Medicine Contact information: [address]  [place] [address]  [place]  [address] [place], [place] [address] [phone]  Services [facility] offers a variety of basic health services.  Services include but are not limited to: Blood pressure checks  Heart rate checks  Blood sugar checks  Urine analysis  Rapid strep tests  Pregnancy tests.  Health education and referrals  People needing more complex services will be directed to a physician online. Using these virtual visits, doctors can evaluate and prescribe medicine and treatments. There will be no medication on-site, though [facility] will help  patients fill their prescriptions at little to no cost.   For More information please go to: [email or web address]

## 2019-03-04 NOTE — ED Provider Notes (Signed)
[facility] EMERGENCY DEPARTMENT Provider Note   CSN: [id number] Arrival date & time: 03/04/19  1036     History Chief Complaint  Patient presents with  . Fall    Lindsay James is a 28 y.o. female.  HPI   This patient is a 29 year old female presenting to the hospital with a complaint of left wrist pain but stating "my whole body hurts".  She reports that yesterday while she was at work at [employer] she fell tripping over a chair and landed on her bilateral outstretched hands rolling onto her left side.  Initially she was able to get up and walk around but when she went to bed last night she was having pain in her wrist and when she woke up this morning she stated that she had a hard time getting out of bed because her whole body hurt.  She did not go to work for this reason.  She now complains most of left wrist but states that everything hurts including her arms legs chest back stomach and neck.  She denies hitting her head, denies loss of consciousness, she was able to ambulate back to her room without any difficulty.  She denies swelling  Past Medical History:  Diagnosis Date  . Asthma   . Chlamydia   . Diabetes mellitus without complication (HCC)   . Gonorrhea   . High cholesterol   . Migraine     Patient Active Problem List   Diagnosis Date Noted  . [redacted] weeks gestation of pregnancy   . Cerebral ventriculomegaly of fetus   . Cervical insufficiency during pregnancy in second trimester, antepartum   . [redacted] weeks gestation of pregnancy     History reviewed. No pertinent surgical history.   OB History    Gravida  1   Para      Term      Preterm      AB      Living  1     SAB      TAB      Ectopic      Multiple      Live Births  1           Family History  Problem Relation Age of Onset  . Diabetes Maternal Grandmother   . Arthritis Mother   . Cancer Father     Social History   Tobacco Use  . Smoking status: Current Some Day Smoker   Packs/day: 0.50    Types: Cigarettes  . Smokeless tobacco: Never Used  Substance Use Topics  . Alcohol use: Yes    Comment: socially  . Drug use: Yes    Types: Marijuana    Home Medications Prior to Admission medications   Medication Sig Start Date End Date Taking? Authorizing Provider  Biotin w/ Vitamins C & E (HAIR/SKIN/NAILS PO) Take 1 tablet by mouth daily.    [provider]  naproxen (NAPROSYN) 500 MG tablet Take 1 tablet (500 mg total) by mouth 2 (two) times daily with a meal. 03/04/19   Eber Hong, MD    Allergies    Peanut-containing drug products  Review of Systems   Review of Systems  Constitutional: Negative for fever.  Respiratory: Negative for cough and shortness of breath.   Musculoskeletal: Positive for arthralgias, back pain and neck pain.  Neurological: Negative for weakness and numbness.    Physical Exam Updated Vital Signs BP 128/66 (BP Location: Right Arm)   Pulse 66   Temp 98.8  F (37.1 C) (Oral)   Resp 15   Ht 1.626 m (5\' 4" )   Wt 132.9 kg   LMP 02/10/2019   SpO2 100%   BMI 50.29 kg/m   Physical Exam Vitals and nursing note reviewed.  Constitutional:      General: She is not in acute distress.    Appearance: She is well-developed.  HENT:     Head: Normocephalic and atraumatic.     Mouth/Throat:     Pharynx: No oropharyngeal exudate.  Eyes:     General: No scleral icterus.       Right eye: No discharge.        Left eye: No discharge.     Conjunctiva/sclera: Conjunctivae normal.     Pupils: Pupils are equal, round, and reactive to light.  Neck:     Thyroid: No thyromegaly.     Vascular: No JVD.  Cardiovascular:     Rate and Rhythm: Normal rate and regular rhythm.     Heart sounds: Normal heart sounds. No murmur. No friction rub. No gallop.   Pulmonary:     Effort: Pulmonary effort is normal. No respiratory distress.     Breath sounds: Normal breath sounds. No wheezing or rales.  Abdominal:     General: Bowel sounds  are normal. There is no distension.     Palpations: Abdomen is soft. There is no mass.     Tenderness: There is no abdominal tenderness.  Musculoskeletal:        General: Tenderness present. Normal range of motion.     Cervical back: Normal range of motion and neck supple.     Comments: The patient has no visible swelling or injury to the arms or the legs.  She does have decreased range of motion of the left wrist secondary to tenderness however this is mostly over the ulnar styloid.  She has no pain in the anatomic snuffbox and is able to fully range of motion all 4 extremities without any difficulty.  She is able to straight leg raise bilaterally, there is no focal tenderness with flexion or extension of the legs.  Lymphadenopathy:     Cervical: No cervical adenopathy.  Skin:    General: Skin is warm and dry.     Findings: No erythema or rash.  Neurological:     Mental Status: She is alert.     Coordination: Coordination normal.     Comments: No numbness or weakness of any of the 4 extremities, speech is clear, no facial droop  Psychiatric:        Behavior: Behavior normal.     ED Results / Procedures / Treatments   Labs (all labs ordered are listed, but only abnormal results are displayed) Labs Reviewed - No data to display  EKG None  Radiology DG Wrist Complete Left  Result Date: 03/04/2019 CLINICAL DATA:  Tripped over chair yesterday and fell onto LEFT side, LEFT wrist pain since EXAM: LEFT WRIST - COMPLETE 3+ VIEW COMPARISON:  None FINDINGS: Osseous mineralization normal. 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 Nonvisualization of pisiform. Joint spaces preserved. No acute fracture, dislocation, or bone destruction. IMPRESSION: No acute osseous abnormalities. 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 Electronically Signed   By: Lavonia Dana M.D.   On: 03/04/2019 11:30    Procedures Procedures (including critical care time)  Medications  Ordered in ED Medications  ibuprofen (ADVIL) tablet 800 mg (800 mg Oral Given 03/04/19 1112)    ED Course  I  have reviewed the triage vital signs and the nursing notes.  Pertinent labs & imaging results that were available during my care of the patient were reviewed by me and considered in my medical decision making (see chart for details).  Clinical Course as of Mar 03 1144  Wed Mar 04, 2019  1143 There are no fractures seen in the left wrist x-ray, the patient is stable for discharge   [BM]    Clinical Course User Index [BM] Eber HongMiller, Kattleya Kuhnert, MD   MDM Rules/Calculators/A&P                      The patient has normal gait, no obvious injuries, she has tenderness over the left wrist and an imaging study will be performed to rule out fracture, anticipate anti-inflammatories at home.  The patient was given reassurance that this is unlikely to be fracture  Final Clinical Impression(s) / ED Diagnoses Final diagnoses:  Wrist sprain, left, initial encounter  Fall, initial encounter    Rx / DC Orders ED Discharge Orders         Ordered    naproxen (NAPROSYN) 500 MG tablet  2 times daily with meals     03/04/19 1144           Eber HongMiller, Kaysha Parsell, MD 03/04/19 1145

## 2019-03-04 NOTE — ED Triage Notes (Signed)
PT states she tripped over a chair yesterday at work and fell onto her left side. PT c/o left wrist, elbow, ankle, knee and hip pain. PT ambulatory in triage with NAD.

## 2019-07-20 ENCOUNTER — Emergency Department (HOSPITAL_COMMUNITY): Payer: Self-pay

## 2019-07-20 ENCOUNTER — Other Ambulatory Visit: Payer: Self-pay

## 2019-07-20 ENCOUNTER — Encounter (HOSPITAL_COMMUNITY): Payer: Self-pay | Admitting: *Deleted

## 2019-07-20 ENCOUNTER — Emergency Department (HOSPITAL_COMMUNITY)
Admission: EM | Admit: 2019-07-20 | Discharge: 2019-07-20 | Disposition: A | Discharge status: Home / Self Care | Payer: Self-pay | Attending: Emergency Medicine | Admitting: Emergency Medicine

## 2019-07-20 DIAGNOSIS — R1084 Generalized abdominal pain: Secondary | ICD-10-CM | POA: Insufficient documentation

## 2019-07-20 DIAGNOSIS — E119 Type 2 diabetes mellitus without complications: Secondary | ICD-10-CM | POA: Insufficient documentation

## 2019-07-20 DIAGNOSIS — F17210 Nicotine dependence, cigarettes, uncomplicated: Secondary | ICD-10-CM | POA: Insufficient documentation

## 2019-07-20 DIAGNOSIS — Z79899 Other long term (current) drug therapy: Secondary | ICD-10-CM | POA: Insufficient documentation

## 2019-07-20 DIAGNOSIS — F1210 Cannabis abuse, uncomplicated: Secondary | ICD-10-CM | POA: Insufficient documentation

## 2019-07-20 DIAGNOSIS — R112 Nausea with vomiting, unspecified: Secondary | ICD-10-CM | POA: Insufficient documentation

## 2019-07-20 LAB — COMPREHENSIVE METABOLIC PANEL
ALT: 25 U/L (ref 0–44)
AST: 23 U/L (ref 15–41)
Albumin: 4 g/dL (ref 3.5–5.0)
Alkaline Phosphatase: 86 U/L (ref 38–126)
Anion gap: 13 (ref 5–15)
BUN: 10 mg/dL (ref 6–20)
CO2: 24 mmol/L (ref 22–32)
Calcium: 9.3 mg/dL (ref 8.9–10.3)
Chloride: 102 mmol/L (ref 98–111)
Creatinine, Ser: 0.98 mg/dL (ref 0.44–1.00)
GFR calc Af Amer: >60 mL/min (ref >60)
GFR calc non Af Amer: >60 mL/min (ref >60)
Glucose, Bld: 97 mg/dL (ref 70–99)
Potassium: 4.2 mmol/L (ref 3.5–5.1)
Sodium: 139 mmol/L (ref 135–145)
Total Bilirubin: 0.4 mg/dL (ref 0.3–1.2)
Total Protein: 7.7 g/dL (ref 6.5–8.1)

## 2019-07-20 LAB — CBC
HCT: 41.9 % (ref 36.0–46.0)
Hemoglobin: 13.3 g/dL (ref 12.0–15.0)
MCH: 23.8 pg — ABNORMAL LOW (ref 26.0–34.0)
MCHC: 31.7 g/dL (ref 30.0–36.0)
MCV: 75.1 fL — ABNORMAL LOW (ref 80.0–100.0)
Platelets: 262 10*3/uL (ref 150–400)
RBC: 5.58 MIL/uL — ABNORMAL HIGH (ref 3.87–5.11)
RDW: 15.7 % — ABNORMAL HIGH (ref 11.5–15.5)
WBC: 19.3 10*3/uL — ABNORMAL HIGH (ref 4.0–10.5)
nRBC: 0 % (ref 0.0–0.2)

## 2019-07-20 LAB — LIPASE, BLOOD: Lipase: 15 U/L (ref 11–51)

## 2019-07-20 LAB — HCG, QUANTITATIVE, PREGNANCY: hCG, Beta Chain, Quant, S: <1 m[IU]/mL (ref <5)

## 2019-07-20 MED ORDER — LIDOCAINE VISCOUS HCL 2 % MT SOLN
15.0000 mL | Freq: Once | OROMUCOSAL | Status: AC
  Administered 2019-07-20: 21:00:00 15 mL via ORAL
  Filled 2019-07-20: qty 15

## 2019-07-20 MED ORDER — ONDANSETRON 4 MG PO TBDP
4.0000 mg | ORAL_TABLET | Freq: Three times a day (TID) | ORAL | 0 refills | Status: DC | PRN
Start: 2019-07-20 — End: 2019-10-28

## 2019-07-20 MED ORDER — DICYCLOMINE HCL 10 MG PO CAPS
10.0000 mg | ORAL_CAPSULE | Freq: Once | ORAL | Status: AC
  Administered 2019-07-20: 22:00:00 10 mg via ORAL
  Filled 2019-07-20: qty 1

## 2019-07-20 MED ORDER — SODIUM CHLORIDE 0.9 % IV BOLUS
1000.0000 mL | Freq: Once | INTRAVENOUS | Status: AC
  Administered 2019-07-20: 1000 mL via INTRAVENOUS

## 2019-07-20 MED ORDER — DICYCLOMINE HCL 20 MG PO TABS
20.0000 mg | ORAL_TABLET | Freq: Two times a day (BID) | ORAL | 0 refills | Status: DC
Start: 2019-07-20 — End: 2020-01-26

## 2019-07-20 MED ORDER — ONDANSETRON HCL 4 MG/2ML IJ SOLN
4.0000 mg | Freq: Once | INTRAMUSCULAR | Status: AC
  Administered 2019-07-20: 4 mg via INTRAVENOUS
  Filled 2019-07-20: qty 2

## 2019-07-20 MED ORDER — ALUM & MAG HYDROXIDE-SIMETH 200-200-20 MG/5ML PO SUSP
30.0000 mL | Freq: Once | ORAL | Status: AC
  Administered 2019-07-20: 30 mL via ORAL
  Filled 2019-07-20: qty 30

## 2019-07-20 MED ORDER — FENTANYL CITRATE (PF) 100 MCG/2ML IJ SOLN
50.0000 ug | Freq: Once | INTRAMUSCULAR | Status: AC
  Administered 2019-07-20: 50 ug via INTRAVENOUS
  Filled 2019-07-20: qty 2

## 2019-07-20 MED ORDER — SODIUM CHLORIDE 0.9% FLUSH: 3.0000 mL | Freq: Once | INTRAVENOUS | Status: DC

## 2019-07-20 MED ORDER — ONDANSETRON HCL 4 MG/2ML IJ SOLN
4.0000 mg | Freq: Once | INTRAMUSCULAR | Status: AC
  Administered 2019-07-20: 21:00:00 4 mg via INTRAVENOUS
  Filled 2019-07-20: qty 2

## 2019-07-20 NOTE — ED Triage Notes (Signed)
Pt c/o abdominal pain and epigastric pain; pt states she has been vomiting since last night; pt admits to drinking ETOH yesterday

## 2019-07-20 NOTE — Discharge Instructions (Addendum)
Take bentyl and zofran as directed.   Make sure you stay hydrated.  Monitor symptoms closely.  Return to the emergency department for any worsening abdominal pain, vomiting, vomiting pure blood, difficulty breathing or any other worsening or concerning symptoms.

## 2019-07-20 NOTE — ED Provider Notes (Signed)
[facility] EMERGENCY DEPARTMENT Provider Note   CSN: [id number] Arrival date & time: 07/20/19  1335     History Chief Complaint  Patient presents with  . Abdominal Pain    Lindsay James is a 29 y.o. female past no history of asthma, diabetes, gonorrhea who presents for evaluation of abdominal pain, chest pain, nausea vomiting that began this morning.  She reports she has had too numerous to count episodes of vomiting since onset of symptoms.  She does report that she has had a few episodes that have had streaks of blood mixed in with the vomit.  No gross hematemesis.  She is also had 1 loose stool.  No blood noted in the stool.  She states that the abdominal pain is mostly in the upper/epigastric region but also feels like it radiates down.  She states it is sharp and cramping at the same time.  She feels like the pain radiates into the middle of her chest and states it is pressure and a burning sensation.  She states that the chest pain is worse when you press on it and when she moves.  She has some associated shortness of breath with vomiting when she gets bursts of pain.  She has not had a measured fever but states she first felt hot at home.  She endorses drinking alcohol yesterday after her grandmother's funeral.  She estimates that that she drank about 4 shots of liquor and about 4 beers.  She is not a daily drinker.  She does smoke.  No history of hypertension.  She states she is prediabetic.  No personal cardiac history.  No family history of MI before the age of 65.  The history is provided by the patient.       Past Medical History:  Diagnosis Date  . Asthma   . Chlamydia   . Diabetes mellitus without complication ([place])   . Gonorrhea   . High cholesterol   . Migraine     Patient Active Problem List   Diagnosis Date Noted  . [redacted] weeks gestation of pregnancy   . Cerebral ventriculomegaly of fetus   . Cervical insufficiency during pregnancy in second trimester, antepartum    . [redacted] weeks gestation of pregnancy     History reviewed. No pertinent surgical history.   OB History    Gravida  1   Para      Term      Preterm      AB      Living  1     SAB      TAB      Ectopic      Multiple      Live Births  1           Family History  Problem Relation Age of Onset  . Diabetes Maternal Grandmother   . Arthritis Mother   . Cancer Father     Social History   Tobacco Use  . Smoking status: Current Some Day Smoker    Packs/day: 0.50    Types: Cigarettes  . Smokeless tobacco: Never Used  Substance Use Topics  . Alcohol use: Yes    Comment: socially  . Drug use: Yes    Types: Marijuana    Home Medications Prior to Admission medications   Medication Sig Start Date End Date Taking? Authorizing Provider  Biotin w/ Vitamins C & E (HAIR/SKIN/NAILS PO) Take 1 tablet by mouth daily.    [provider]  dicyclomine (BENTYL) 20 MG tablet Take 1 tablet (20 mg total) by mouth 2 (two) times daily. 07/20/19   Maxwell Caul, PA-C  naproxen (NAPROSYN) 500 MG tablet Take 1 tablet (500 mg total) by mouth 2 (two) times daily with a meal. 03/04/19   Eber Hong, MD  ondansetron (ZOFRAN ODT) 4 MG disintegrating tablet Take 1 tablet (4 mg total) by mouth every 8 (eight) hours as needed for nausea or vomiting. 07/20/19   Maxwell Caul, PA-C    Allergies    Peanut-containing drug products  Review of Systems   Review of Systems  Constitutional: Negative for fever.  Respiratory: Negative for cough and shortness of breath.   Cardiovascular: Positive for chest pain.  Gastrointestinal: Positive for abdominal pain, nausea and vomiting. Negative for blood in stool.  Genitourinary: Negative for dysuria and hematuria.  Neurological: Negative for headaches.  All other systems reviewed and are negative.   Physical Exam Updated Vital Signs BP 116/76 (BP Location: Left Arm)   Pulse 67   Temp 98.4 F (36.9 C) (Oral)   Resp 16   SpO2  100%   Physical Exam Vitals and nursing note reviewed.  Constitutional:      Appearance: Normal appearance. She is well-developed.     Comments: Appears uncomfortable but NAD   HENT:     Head: Normocephalic and atraumatic.  Eyes:     General: Lids are normal.     Conjunctiva/sclera: Conjunctivae normal.     Pupils: Pupils are equal, round, and reactive to light.  Cardiovascular:     Rate and Rhythm: Normal rate and regular rhythm.     Pulses: Normal pulses.     Heart sounds: Normal heart sounds. No murmur. No friction rub. No gallop.   Pulmonary:     Effort: Pulmonary effort is normal.     Breath sounds: Normal breath sounds.     Comments: Lungs clear to auscultation bilaterally.  Symmetric chest rise.  No wheezing, rales, rhonchi. Chest:     Comments: Pain reproduced with palpation of the anterior mid chest wall.  No deformity or crepitus noted. Abdominal:     Palpations: Abdomen is soft. Abdomen is not rigid.     Tenderness: There is generalized abdominal tenderness. There is no right CVA tenderness, left CVA tenderness or guarding.     Comments: Generalized abdominal tenderness noted that is slightly worse in the epigastric and left upper quadrant.  No rigidity, guarding.  No CVA tenderness noted bilaterally.  Musculoskeletal:        General: Normal range of motion.     Cervical back: Full passive range of motion without pain.  Skin:    General: Skin is warm and dry.     Capillary Refill: Capillary refill takes less than 2 seconds.  Neurological:     Mental Status: She is alert and oriented to person, place, and time.  Psychiatric:        Speech: Speech normal.     ED Results / Procedures / Treatments   Labs (all labs ordered are listed, but only abnormal results are displayed) Labs Reviewed  CBC - Abnormal; Notable for the following components:      Result Value   WBC 19.3 (*)    RBC 5.58 (*)    MCV 75.1 (*)    MCH 23.8 (*)    RDW 15.7 (*)    All other  components within normal limits  LIPASE, BLOOD  COMPREHENSIVE METABOLIC PANEL  HCG, QUANTITATIVE, PREGNANCY  URINALYSIS, ROUTINE W REFLEX MICROSCOPIC  POC URINE PREG, ED    EKG EKG Interpretation  Date/Time:  Monday Jul 20 2019 17:55:46 EDT Ventricular Rate:  53 PR Interval:  134 QRS Duration: 80 QT Interval:  436 QTC Calculation: 409 R Axis:   101 Text Interpretation: Sinus bradycardia with sinus arrhythmia Rightward axis Borderline ECG Since last tracing Rightward axis NOW PRESENT Confirmed by Susy Frizzle [phone]) on 07/20/2019 6:03:58 PM   Radiology DG Chest Portable 1 View  Result Date: 07/20/2019 CLINICAL DATA:  Chest pain EXAM: PORTABLE CHEST 1 VIEW COMPARISON:  12/06/2015 FINDINGS: The heart size and mediastinal contours are within normal limits. Both lungs are clear. The visualized skeletal structures are unremarkable. IMPRESSION: No active disease. Electronically Signed   By: Jasmine Pang M.D.   On: 07/20/2019 17:48    Procedures Procedures (including critical care time)  Medications Ordered in ED Medications  sodium chloride flush (NS) 0.9 % injection 3 mL (has no administration in time range)  dicyclomine (BENTYL) capsule 10 mg (has no administration in time range)  sodium chloride 0.9 % bolus 1,000 mL (0 mLs Intravenous Stopped 07/20/19 2007)  ondansetron (ZOFRAN) injection 4 mg (4 mg Intravenous Given 07/20/19 1742)  fentaNYL (SUBLIMAZE) injection 50 mcg (50 mcg Intravenous Given 07/20/19 2102)  ondansetron (ZOFRAN) injection 4 mg (4 mg Intravenous Given 07/20/19 2102)  alum & mag hydroxide-simeth (MAALOX/MYLANTA) 200-200-20 MG/5ML suspension 30 mL (30 mLs Oral Given 07/20/19 2102)    And  lidocaine (XYLOCAINE) 2 % viscous mouth solution 15 mL (15 mLs Oral Given 07/20/19 2102)    ED Course  I have reviewed the triage vital signs and the nursing notes.  Pertinent labs & imaging results that were available during my care of the patient were reviewed by me and considered  in my medical decision making (see chart for details).    MDM Rules/Calculators/A&P                      29 year old female who presents for evaluation of nausea/vomiting, abdominal pain chest pain that began this morning.  Endorses drinking alcohol last night.  Reports feeling hot at home but no measured fevers.  On initial ED arrival, she is afebrile, nontoxic-appearing.  Vitals are stable.  On exam, she appears uncomfortable but no acute distress.  She has diffuse abdominal tenderness that is slightly worse in the epigastric and left upper quadrant.  No McBurney's point tenderness.  No CVA tenderness.  Suspect this is most likely alcohol gastritis.  Additionally, she does report that she has had some episodes of vomiting with streaks of blood in it.  She may have had a Mallory-Weiss tear.  Do not suspect acute GI bleed as she has not had any gross hematemesis.  Additionally, her chest pain may be related to gastritis/GERD.  Do not suspect that this is acute ACS etiology.  She does not have any risk factors.  CBC shows leukocytosis of 19.3.  Hemoglobin is 13.3.  I reviewed her records.  She has had evidence of leukocytosis previously though usually it is between 11-13.  CMP shows normal BUN and creatinine.  Lipase is normal.  I-STAT beta is negative.  Reevaluation.  Patient still having some pain.  She had 1 more additional episode of vomiting.  We will plan to give her additional analgesics and reassess.  Reevaluation.  Patient appears much more comfortable.  She is sitting and eating graham crackers and is drinking soda without any vomiting.  She  has had no episodes of bloody vomit here in the emergency department.  She reports her pain is improved.  Repeat abdominal exam does show improved tenderness.  She still has some slight tenderness noted to the epigastric region but no lower abdominal tenderness.  No McBurney's point tenderness.  I suspect this is alcohol gastritis versus viral illness.   Additionally, she may have had a small Mallory-Weiss tear from vomiting but do not suspect active GI bleed.  At this time, her exam is not concerning for diverticulitis, appendicitis, ovarian torsion.  No indication for CT on pelvis as do not suspect surgical abdomen.  I suspect her leukocytosis may be from vomiting.  We will plan to send her home with Bentyl and Zofran. At this time, patient exhibits no emergent life-threatening condition that require further evaluation in ED or admission. Patient had ample opportunity for questions and discussion. All patient's questions were answered with full understanding. Strict return precautions discussed. Patient expresses understanding and agreement to plan.   Portions of this note were generated with Scientist, clinical (histocompatibility and immunogenetics). Dictation errors may occur despite best attempts at proofreading.  Final Clinical Impression(s) / ED Diagnoses Final diagnoses:  Generalized abdominal pain  Non-intractable vomiting with nausea, unspecified vomiting type    Rx / DC Orders ED Discharge Orders         Ordered    dicyclomine (BENTYL) 20 MG tablet  2 times daily     07/20/19 2143    ondansetron (ZOFRAN ODT) 4 MG disintegrating tablet  Every 8 hours PRN     07/20/19 2143           Rosana Hoes 07/20/19 2148    Pollyann Savoy, MD 07/20/19 2213

## 2019-07-20 NOTE — ED Notes (Signed)
Pt vomited in trash can no blood noted per sight.

## 2019-07-21 ENCOUNTER — Other Ambulatory Visit: Payer: Self-pay

## 2019-07-21 ENCOUNTER — Emergency Department (HOSPITAL_COMMUNITY)
Admission: EM | Admit: 2019-07-21 | Discharge: 2019-07-22 | Disposition: A | Discharge status: Home / Self Care | Payer: Self-pay | Attending: Emergency Medicine | Admitting: Emergency Medicine

## 2019-07-21 ENCOUNTER — Encounter (HOSPITAL_COMMUNITY): Payer: Self-pay | Admitting: Emergency Medicine

## 2019-07-21 DIAGNOSIS — Z91010 Allergy to peanuts: Secondary | ICD-10-CM | POA: Insufficient documentation

## 2019-07-21 DIAGNOSIS — Z79899 Other long term (current) drug therapy: Secondary | ICD-10-CM | POA: Insufficient documentation

## 2019-07-21 DIAGNOSIS — E119 Type 2 diabetes mellitus without complications: Secondary | ICD-10-CM | POA: Insufficient documentation

## 2019-07-21 DIAGNOSIS — F17210 Nicotine dependence, cigarettes, uncomplicated: Secondary | ICD-10-CM | POA: Insufficient documentation

## 2019-07-21 DIAGNOSIS — Z7984 Long term (current) use of oral hypoglycemic drugs: Secondary | ICD-10-CM | POA: Insufficient documentation

## 2019-07-21 DIAGNOSIS — J45909 Unspecified asthma, uncomplicated: Secondary | ICD-10-CM | POA: Insufficient documentation

## 2019-07-21 DIAGNOSIS — K529 Noninfective gastroenteritis and colitis, unspecified: Secondary | ICD-10-CM | POA: Insufficient documentation

## 2019-07-21 MED ORDER — DIPHENHYDRAMINE HCL 50 MG/ML IJ SOLN
25.0000 mg | Freq: Once | INTRAMUSCULAR | Status: AC
  Administered 2019-07-22: 25 mg via INTRAVENOUS
  Filled 2019-07-21: qty 1

## 2019-07-21 MED ORDER — SODIUM CHLORIDE 0.9 % IV BOLUS
1000.0000 mL | Freq: Once | INTRAVENOUS | Status: AC
  Administered 2019-07-22: 1000 mL via INTRAVENOUS

## 2019-07-21 MED ORDER — ONDANSETRON HCL 4 MG/2ML IJ SOLN
4.0000 mg | Freq: Once | INTRAMUSCULAR | Status: AC
  Administered 2019-07-22: 4 mg via INTRAVENOUS
  Filled 2019-07-21: qty 2

## 2019-07-21 MED ORDER — METOCLOPRAMIDE HCL 5 MG/ML IJ SOLN
10.0000 mg | Freq: Once | INTRAMUSCULAR | Status: AC
  Administered 2019-07-22: 10 mg via INTRAVENOUS
  Filled 2019-07-21: qty 2

## 2019-07-21 NOTE — ED Provider Notes (Signed)
[facility] EMERGENCY DEPARTMENT Provider Note   CSN: [id number] Arrival date & time: 07/21/19  2119     History Chief Complaint  Patient presents with  . Abdominal Pain    Lindsay James is a 29 y.o. female.  Patient returns with continued epigastric abdominal pain nausea and vomiting.  Patient was seen in the ER yesterday with same.  Patient reports that she has had continued symptoms since leaving the emergency department.  Pain is mostly in upper abdomen but when she vomits she gets a burning sensation up into the chest.  No shortness of breath.        Past Medical History:  Diagnosis Date  . Asthma   . Chlamydia   . Diabetes mellitus without complication ([facility])   . Gonorrhea   . High cholesterol   . Migraine     Patient Active Problem List   Diagnosis Date Noted  . [redacted] weeks gestation of pregnancy   . Cerebral ventriculomegaly of fetus   . Cervical insufficiency during pregnancy in second trimester, antepartum   . [redacted] weeks gestation of pregnancy     History reviewed. No pertinent surgical history.   OB History    Gravida  1   Para      Term      Preterm      AB      Living  1     SAB      TAB      Ectopic      Multiple      Live Births  1           Family History  Problem Relation Age of Onset  . Diabetes Maternal Grandmother   . Arthritis Mother   . Cancer Father     Social History   Tobacco Use  . Smoking status: Current Some Day Smoker    Packs/day: 0.50    Types: Cigarettes  . Smokeless tobacco: Never Used  Substance Use Topics  . Alcohol use: Yes    Comment: socially  . Drug use: Yes    Types: Marijuana    Home Medications Prior to Admission medications   Medication Sig Start Date End Date Taking? Authorizing Provider  Biotin w/ Vitamins C & E (HAIR/SKIN/NAILS PO) Take 1 tablet by mouth daily.    [provider]  ciprofloxacin (CIPRO) 500 MG tablet Take 1 tablet (500 mg total) by mouth 2 (two) times  daily. 07/22/19   Gilda Crease, MD  dicyclomine (BENTYL) 20 MG tablet Take 1 tablet (20 mg total) by mouth 2 (two) times daily. 07/20/19   Maxwell Caul, PA-C  HYDROcodone-acetaminophen (NORCO/VICODIN) 5-325 MG tablet Take 1 tablet by mouth every 4 (four) hours as needed. 07/22/19   Gilda Crease, MD  metroNIDAZOLE (FLAGYL) 500 MG tablet Take 1 tablet (500 mg total) by mouth 3 (three) times daily. 07/22/19   Gilda Crease, MD  ondansetron (ZOFRAN ODT) 4 MG disintegrating tablet Take 1 tablet (4 mg total) by mouth every 8 (eight) hours as needed for nausea or vomiting. 07/20/19   Maxwell Caul, PA-C  promethazine (PHENERGAN) 25 MG tablet Take 1 tablet (25 mg total) by mouth every 6 (six) hours as needed for nausea or vomiting. 07/22/19   Murphy Bundick, Canary Brim, MD    Allergies    Peanut-containing drug products  Review of Systems   Review of Systems  Respiratory: Negative for shortness of breath.   Gastrointestinal: Positive for abdominal pain,  nausea and vomiting.  All other systems reviewed and are negative.   Physical Exam Updated Vital Signs BP 107/69 (BP Location: Right Arm)   Pulse (!) 56   Temp 99.4 F (37.4 C) (Oral)   Resp 18   Ht 5\' 4"  (1.626 m)   Wt 131.5 kg   SpO2 98%   BMI 49.78 kg/m   Physical Exam Vitals and nursing note reviewed.  Constitutional:      General: She is not in acute distress.    Appearance: Normal appearance. She is well-developed.  HENT:     Head: Normocephalic and atraumatic.     Right Ear: Hearing normal.     Left Ear: Hearing normal.     Nose: Nose normal.  Eyes:     Conjunctiva/sclera: Conjunctivae normal.     Pupils: Pupils are equal, round, and reactive to light.  Cardiovascular:     Rate and Rhythm: Regular rhythm.     Heart sounds: S1 normal and S2 normal. No murmur. No friction rub. No gallop.   Pulmonary:     Effort: Pulmonary effort is normal. No respiratory distress.     Breath sounds: Normal breath  sounds.  Chest:     Chest wall: No tenderness.  Abdominal:     General: Bowel sounds are normal.     Palpations: Abdomen is soft.     Tenderness: There is abdominal tenderness in the epigastric area. There is no guarding or rebound. Negative signs include Murphy's sign and McBurney's sign.     Hernia: No hernia is present.  Musculoskeletal:        General: Normal range of motion.     Cervical back: Normal range of motion and neck supple.  Skin:    General: Skin is warm and dry.     Findings: No rash.  Neurological:     Mental Status: She is alert and oriented to person, place, and time.     GCS: GCS eye subscore is 4. GCS verbal subscore is 5. GCS motor subscore is 6.     Cranial Nerves: No cranial nerve deficit.     Sensory: No sensory deficit.     Coordination: Coordination normal.  Psychiatric:        Speech: Speech normal.        Behavior: Behavior normal.        Thought Content: Thought content normal.     ED Results / Procedures / Treatments   Labs (all labs ordered are listed, but only abnormal results are displayed) Labs Reviewed  CBC WITH DIFFERENTIAL/PLATELET - Abnormal; Notable for the following components:      Result Value   WBC 14.2 (*)    RBC 5.88 (*)    MCV 74.8 (*)    MCH 23.8 (*)    RDW 16.7 (*)    Neutro Abs 10.4 (*)    All other components within normal limits  COMPREHENSIVE METABOLIC PANEL - Abnormal; Notable for the following components:   Sodium 134 (*)    Chloride 96 (*)    Creatinine, Ser 1.11 (*)    All other components within normal limits  LIPASE, BLOOD    EKG EKG Interpretation  Date/Time:  Tuesday Jul 21 2019 21:36:26 EDT Ventricular Rate:  52 PR Interval:  134 QRS Duration: 70 QT Interval:  438 QTC Calculation: 407 R Axis:   76 Text Interpretation: Sinus bradycardia Low voltage QRS Borderline ECG No significant change since last tracing Confirmed by Calvert Cantor [phone]) on 07/21/2019  10:00:07 PM   Radiology CT ABDOMEN  PELVIS W CONTRAST  Result Date: 07/22/2019 CLINICAL DATA:  Nausea, vomiting and abdominal pain Lung bases are clear. Normal heart size. No pericardial effusion. EXAM: CT ABDOMEN AND PELVIS WITH CONTRAST TECHNIQUE: Multidetector CT imaging of the abdomen and pelvis was performed using the standard protocol following bolus administration of intravenous contrast. CONTRAST:  OMNIPAQUE IOHEXOL 300 MG/ML  SOLN COMPARISON:  None. FINDINGS: Lower chest: Lung bases are clear. Normal heart size. No pericardial effusion. Hepatobiliary: Focal fatty infiltration seen along the falciform ligament. Few subcentimeter hypoattenuating foci are seen throughout the left and right lobe liver too small to fully characterize on CT imaging but statistically likely benign. (2/29, 36, 28, 11). No focal worrisome liver lesions. Smooth liver surface contour. Normal hepatic attenuation. Normal gallbladder. No visible calcified gallstones or biliary ductal dilatation. Pancreas: Unremarkable. No pancreatic ductal dilatation or surrounding inflammatory changes. Spleen: Normal in size without focal abnormality. Adrenals/Urinary Tract: Adrenal glands are unremarkable. Kidneys are normal, without renal calculi, focal lesion, or hydronephrosis. Question some mild mural thickening of the urinary bladder with faint perivesicular haze. Stomach/Bowel: Distal esophagus, stomach and duodenal sweep are unremarkable. No small bowel wall thickening or dilatation. No evidence of obstruction. A normal appendix is visualized. Pancolonic edematous mural thickening with pericolonic haze. No extraluminal gas or fluid. No pneumatosis or air within the draining vasculature. Vascular/Lymphatic: No significant vascular findings are present. No enlarged abdominal or pelvic lymph nodes. Reproductive: Slightly retroverted uterus. No concerning adnexal lesions. Other: No abdominopelvic free fluid or air. Small fat containing umbilical hernia. No bowel containing  hernias. Musculoskeletal: No acute osseous abnormality or suspicious osseous lesion. IMPRESSION: 1. Pancolonic edematous mural thickening with pericolonic haze, consistent with colitis of infectious or inflammatory etiology. No evidence of perforation or abscess formation. 2. Question some minimal bladder wall thickening with faint perivesicular haze, which may represent a cystitis. Correlate with urinalysis. Electronically Signed   By: Kreg Shropshire M.D.   On: 07/22/2019 01:42   DG Chest Portable 1 View  Result Date: 07/20/2019 CLINICAL DATA:  Chest pain EXAM: PORTABLE CHEST 1 VIEW COMPARISON:  12/06/2015 FINDINGS: The heart size and mediastinal contours are within normal limits. Both lungs are clear. The visualized skeletal structures are unremarkable. IMPRESSION: No active disease. Electronically Signed   By: Jasmine Pang M.D.   On: 07/20/2019 17:48    Procedures Procedures (including critical care time)  Medications Ordered in ED Medications  ciprofloxacin (CIPRO) tablet 500 mg (has no administration in time range)  metroNIDAZOLE (FLAGYL) tablet 500 mg (has no administration in time range)  sodium chloride 0.9 % bolus 1,000 mL (1,000 mLs Intravenous New Bag/Given 07/22/19 0005)  ondansetron (ZOFRAN) injection 4 mg (4 mg Intravenous Given 07/22/19 0003)  metoCLOPramide (REGLAN) injection 10 mg (10 mg Intravenous Given 07/22/19 0006)  diphenhydrAMINE (BENADRYL) injection 25 mg (25 mg Intravenous Given 07/22/19 0003)  iohexol (OMNIPAQUE) 300 MG/ML solution 100 mL (100 mLs Intravenous Contrast Given 07/22/19 0051)    ED Course  I have reviewed the triage vital signs and the nursing notes.  Pertinent labs & imaging results that were available during my care of the patient were reviewed by me and considered in my medical decision making (see chart for details).    MDM Rules/Calculators/A&P                      Patient returns with abdominal pain with nausea and vomiting.  Patient was seen yesterday  with same.  She did have a leukocytosis yesterday.  Leukocytosis is improved today but because of persistent symptoms, CT scan was performed.  This does show evidence of colitis.  Patient will be treated as an outpatient with antiemetics, analgesia, Cipro, Flagyl.  Follow-up with GI.  Final Clinical Impression(s) / ED Diagnoses Final diagnoses:  Colitis    Rx / DC Orders ED Discharge Orders         Ordered    ciprofloxacin (CIPRO) 500 MG tablet  2 times daily     07/22/19 0233    metroNIDAZOLE (FLAGYL) 500 MG tablet  3 times daily     07/22/19 0233    promethazine (PHENERGAN) 25 MG tablet  Every 6 hours PRN     07/22/19 0233    HYDROcodone-acetaminophen (NORCO/VICODIN) 5-325 MG tablet  Every 4 hours PRN     07/22/19 0234           Gilda Crease, MD 07/22/19 [phone]

## 2019-07-21 NOTE — ED Triage Notes (Signed)
Pt c/o abdominal and epigastric pain. Pt was seen here for the same, states that she hasn't gotten any better.

## 2019-07-22 ENCOUNTER — Emergency Department (HOSPITAL_COMMUNITY): Payer: Self-pay

## 2019-07-22 LAB — CBC WITH DIFFERENTIAL/PLATELET
Abs Immature Granulocytes: 0.05 10*3/uL (ref 0.00–0.07)
Basophils Absolute: 0 10*3/uL (ref 0.0–0.1)
Basophils Relative: 0 %
Eosinophils Absolute: 0 10*3/uL (ref 0.0–0.5)
Eosinophils Relative: 0 %
HCT: 44 % (ref 36.0–46.0)
Hemoglobin: 14 g/dL (ref 12.0–15.0)
Immature Granulocytes: 0 %
Lymphocytes Relative: 20 %
Lymphs Abs: 2.9 10*3/uL (ref 0.7–4.0)
MCH: 23.8 pg — ABNORMAL LOW (ref 26.0–34.0)
MCHC: 31.8 g/dL (ref 30.0–36.0)
MCV: 74.8 fL — ABNORMAL LOW (ref 80.0–100.0)
Monocytes Absolute: 0.9 10*3/uL (ref 0.1–1.0)
Monocytes Relative: 6 %
Neutro Abs: 10.4 10*3/uL — ABNORMAL HIGH (ref 1.7–7.7)
Neutrophils Relative %: 74 %
Platelets: 266 10*3/uL (ref 150–400)
RBC: 5.88 MIL/uL — ABNORMAL HIGH (ref 3.87–5.11)
RDW: 16.7 % — ABNORMAL HIGH (ref 11.5–15.5)
WBC: 14.2 10*3/uL — ABNORMAL HIGH (ref 4.0–10.5)
nRBC: 0 % (ref 0.0–0.2)

## 2019-07-22 LAB — COMPREHENSIVE METABOLIC PANEL
ALT: 23 U/L (ref 0–44)
AST: 20 U/L (ref 15–41)
Albumin: 3.8 g/dL (ref 3.5–5.0)
Alkaline Phosphatase: 77 U/L (ref 38–126)
Anion gap: 12 (ref 5–15)
BUN: 15 mg/dL (ref 6–20)
CO2: 26 mmol/L (ref 22–32)
Calcium: 9 mg/dL (ref 8.9–10.3)
Chloride: 96 mmol/L — ABNORMAL LOW (ref 98–111)
Creatinine, Ser: 1.11 mg/dL — ABNORMAL HIGH (ref 0.44–1.00)
GFR calc Af Amer: >60 mL/min (ref >60)
GFR calc non Af Amer: >60 mL/min (ref >60)
Glucose, Bld: 88 mg/dL (ref 70–99)
Potassium: 3.5 mmol/L (ref 3.5–5.1)
Sodium: 134 mmol/L — ABNORMAL LOW (ref 135–145)
Total Bilirubin: 0.7 mg/dL (ref 0.3–1.2)
Total Protein: 7.5 g/dL (ref 6.5–8.1)

## 2019-07-22 LAB — LIPASE, BLOOD: Lipase: 21 U/L (ref 11–51)

## 2019-07-22 MED ORDER — PROMETHAZINE HCL 25 MG PO TABS
25.0000 mg | ORAL_TABLET | Freq: Four times a day (QID) | ORAL | 0 refills | Status: DC | PRN
Start: 2019-07-22 — End: 2019-10-28

## 2019-07-22 MED ORDER — HYDROCODONE-ACETAMINOPHEN 5-325 MG PO TABS: 1.0000 | ORAL_TABLET | ORAL | 0 refills | Status: DC | PRN

## 2019-07-22 MED ORDER — METRONIDAZOLE 500 MG PO TABS
500.0000 mg | ORAL_TABLET | Freq: Three times a day (TID) | ORAL | 0 refills | Status: DC
Start: 2019-07-22 — End: 2019-08-14

## 2019-07-22 MED ORDER — CIPROFLOXACIN HCL 500 MG PO TABS
500.0000 mg | ORAL_TABLET | Freq: Two times a day (BID) | ORAL | 0 refills | Status: DC
Start: 2019-07-22 — End: 2019-08-14

## 2019-07-22 MED ORDER — IOHEXOL 300 MG/ML  SOLN
100.0000 mL | Freq: Once | INTRAMUSCULAR | Status: AC | PRN
  Administered 2019-07-22: 01:00:00 100 mL via INTRAVENOUS

## 2019-07-22 MED ORDER — METRONIDAZOLE 500 MG PO TABS
500.0000 mg | ORAL_TABLET | Freq: Once | ORAL | Status: AC
  Administered 2019-07-22: 03:00:00 500 mg via ORAL
  Filled 2019-07-22: qty 1

## 2019-07-22 MED ORDER — CIPROFLOXACIN HCL 250 MG PO TABS
500.0000 mg | ORAL_TABLET | Freq: Once | ORAL | Status: AC
  Administered 2019-07-22: 03:00:00 500 mg via ORAL
  Filled 2019-07-22: qty 2

## 2019-07-23 MED FILL — Hydrocodone-Acetaminophen Tab 5-325 MG: ORAL | Qty: 6 | Status: AC

## 2019-08-14 ENCOUNTER — Ambulatory Visit
Admission: EM | Admit: 2019-08-14 | Discharge: 2019-08-14 | Disposition: A | Discharge status: Home / Self Care | Payer: Medicaid Other | Attending: Emergency Medicine | Admitting: Emergency Medicine

## 2019-08-14 DIAGNOSIS — Z113 Encounter for screening for infections with a predominantly sexual mode of transmission: Secondary | ICD-10-CM | POA: Insufficient documentation

## 2019-08-14 DIAGNOSIS — R109 Unspecified abdominal pain: Secondary | ICD-10-CM | POA: Insufficient documentation

## 2019-08-14 DIAGNOSIS — N898 Other specified noninflammatory disorders of vagina: Secondary | ICD-10-CM | POA: Insufficient documentation

## 2019-08-14 LAB — POCT URINALYSIS DIP (MANUAL ENTRY)
Bilirubin, UA: NEGATIVE
Blood, UA: NEGATIVE
Glucose, UA: NEGATIVE mg/dL
Ketones, POC UA: NEGATIVE mg/dL
Leukocytes, UA: NEGATIVE
Nitrite, UA: NEGATIVE
Protein Ur, POC: NEGATIVE mg/dL
Spec Grav, UA: >=1.03 — AB (ref 1.010–1.025)
Urobilinogen, UA: 0.2 E.U./dL
pH, UA: 5.5 (ref 5.0–8.0)

## 2019-08-14 LAB — POCT URINE PREGNANCY: Preg Test, Ur: NEGATIVE

## 2019-08-14 MED ORDER — AZITHROMYCIN 500 MG PO TABS
1000.0000 mg | ORAL_TABLET | Freq: Once | ORAL | Status: AC
  Administered 2019-08-14: 1000 mg via ORAL

## 2019-08-14 MED ORDER — CEFTRIAXONE SODIUM 1 G IJ SOLR
1.0000 g | Freq: Once | INTRAMUSCULAR | Status: AC
  Administered 2019-08-14: 1 g via INTRAMUSCULAR

## 2019-08-14 MED ORDER — METRONIDAZOLE 500 MG PO TABS
500.0000 mg | ORAL_TABLET | Freq: Two times a day (BID) | ORAL | 0 refills | Status: DC
Start: 2019-08-14 — End: 2019-10-28

## 2019-08-14 MED ORDER — FLUCONAZOLE 200 MG PO TABS
ORAL_TABLET | ORAL | 0 refills | Status: DC
Start: 2019-08-14 — End: 2019-10-28

## 2019-08-14 NOTE — Discharge Instructions (Signed)
Vaginal self-swab obtained.  We will follow up with you regarding abnormal results Given rocephin 250mg  injection and azithromycin 1g in office HIV/ syphilis testing today Prescribed metronidazole 500 mg twice daily for 7 days (do not take while consuming alcohol and/or if breastfeeding) Prescribed diflucan 200 mg once daily and then second dose 72 hours later Take medications as prescribed and to completion If tests results are positive, please abstain from sexual activity until you and your partner(s) have been treated Follow up with PCP as needed Return here or go to ER if you have any new or worsening symptoms fever, chills, nausea, vomiting, abdominal or pelvic pain, painful intercourse, vaginal discharge, vaginal bleeding, persistent symptoms despite treatment, etc.. 

## 2019-08-14 NOTE — ED Provider Notes (Signed)
[place]   [id number] 08/14/19 Arrival Time: 1500   [id number] irritation  SUBJECTIVE:  Lindsay James is a 29 y.o. female who presents with complaints of lower abdominal pressure, vaginal irritation, vaginal odor, and thick white chunky discharge x 2 days.  Last sex x 1 week ago.  Patient has been sexually active with 2 female partners over the past 6 months.  Concerned for STDs today.  Denies alleviating or aggravating factors.  She denies similar symptoms in the past.  She denies fever, chills, nausea, vomiting, abdominal or pelvic pain, urinary symptoms, vaginal itching, vaginal bleeding, dyspareunia, vaginal rashes or lesions.   Patient's last menstrual period was 07/22/2019 (approximate).  ROS: As per HPI.  All other pertinent ROS negative.     Past Medical History:  Diagnosis Date  . Asthma   . Chlamydia   . Diabetes mellitus without complication ([place])   . Gonorrhea   . High cholesterol   . Migraine    History reviewed. No pertinent surgical history. Allergies  Allergen Reactions  . Peanut-Containing Drug Products Rash   No current facility-administered medications on file prior to encounter.   Current Outpatient Medications on File Prior to Encounter  Medication Sig Dispense Refill  . Biotin w/ Vitamins C & E (HAIR/SKIN/NAILS PO) Take 1 tablet by mouth daily.    Marland Kitchen dicyclomine (BENTYL) 20 MG tablet Take 1 tablet (20 mg total) by mouth 2 (two) times daily. 10 tablet 0  . HYDROcodone-acetaminophen (NORCO/VICODIN) 5-325 MG tablet Take 1 tablet by mouth every 4 (four) hours as needed. 6 tablet 0  . ondansetron (ZOFRAN ODT) 4 MG disintegrating tablet Take 1 tablet (4 mg total) by mouth every 8 (eight) hours as needed for nausea or vomiting. 6 tablet 0  . promethazine (PHENERGAN) 25 MG tablet Take 1 tablet (25 mg total) by mouth every 6 (six) hours as needed for nausea or vomiting. 30 tablet 0    Social History   Socioeconomic History  . Marital status:  Single    Spouse name: Not on file  . Number of children: Not on file  . Years of education: Not on file  . Highest education level: Not on file  Occupational History  . Not on file  Tobacco Use  . Smoking status: Current Some Day Smoker    Packs/day: 0.50    Types: Cigarettes  . Smokeless tobacco: Never Used  Substance and Sexual Activity  . Alcohol use: Yes    Comment: socially  . Drug use: Yes    Types: Marijuana  . Sexual activity: Yes    Birth control/protection: Implant  Other Topics Concern  . Not on file  Social History Narrative  . Not on file   Social Determinants of Health   Financial Resource Strain:   . Difficulty of Paying Living Expenses:   Food Insecurity:   . Worried About Charity fundraiser in the Last Year:   . Arboriculturist in the Last Year:   Transportation Needs:   . Film/video editor (Medical):   Marland Kitchen Lack of Transportation (Non-Medical):   Physical Activity:   . Days of Exercise per Week:   . Minutes of Exercise per Session:   Stress:   . Feeling of Stress :   Social Connections:   . Frequency of Communication with Friends and Family:   . Frequency of Social Gatherings with Friends and Family:   . Attends Religious Services:   . Active Member of Clubs or  Organizations:   . Attends Banker Meetings:   Marland Kitchen Marital Status:   Intimate Partner Violence:   . Fear of Current or Ex-Partner:   . Emotionally Abused:   Marland Kitchen Physically Abused:   . Sexually Abused:    Family History  Problem Relation Age of Onset  . Diabetes Maternal Grandmother   . Arthritis Mother   . Cancer Father     OBJECTIVE:  Vitals:   08/14/19 1508  BP: 109/70  Pulse: 99  Resp: 18  Temp: 99.1 F (37.3 C)  SpO2: 96%     General appearance: Alert, NAD, appears stated age Head: NCAT Throat: lips, mucosa, and tongue normal; teeth and gums normal Lungs: CTA bilaterally without adventitious breath sounds Heart: regular rate and rhythm.   Abdomen:  soft, non-tender; bowel sounds normal; no guarding; negative murphys GU: declines Skin: warm and dry Psychological:  Alert and cooperative. Normal mood and affect.  LABS:  Results for orders placed or performed during the hospital encounter of 08/14/19  POCT urinalysis dipstick  Result Value Ref Range   Color, UA yellow yellow   Clarity, UA clear clear   Glucose, UA negative negative mg/dL   Bilirubin, UA negative negative   Ketones, POC UA negative negative mg/dL   Spec Grav, UA >=6.195 (A) 1.010 - 1.025   Blood, UA negative negative   pH, UA 5.5 5.0 - 8.0   Protein Ur, POC negative negative mg/dL   Urobilinogen, UA 0.2 0.2 or 1.0 E.U./dL   Nitrite, UA Negative Negative   Leukocytes, UA Negative Negative  POCT urine pregnancy  Result Value Ref Range   Preg Test, Ur Negative Negative    Labs Reviewed  POCT URINALYSIS DIP (MANUAL ENTRY) - Abnormal; Notable for the following components:      Result Value   Spec Grav, UA >=1.030 (*)    All other components within normal limits  URINE CULTURE  RPR  HIV ANTIBODY (ROUTINE TESTING W REFLEX)  POCT URINE PREGNANCY  CERVICOVAGINAL ANCILLARY ONLY    ASSESSMENT & PLAN:  1. Screening examination for venereal disease   2. Vaginal irritation   3. Abdominal pressure   4. Vaginal discharge     Meds ordered this encounter  Medications  . azithromycin (ZITHROMAX) tablet 1,000 mg  . cefTRIAXone (ROCEPHIN) injection 1 g  . metroNIDAZOLE (FLAGYL) 500 MG tablet    Sig: Take 1 tablet (500 mg total) by mouth 2 (two) times daily.    Dispense:  14 tablet    Refill:  0    Order Specific Question:   Supervising Provider    Answer:   Eustace Moore [[id number]]  . fluconazole (DIFLUCAN) 200 MG tablet    Sig: Take one dose by mouth, wait 72 hours, and then take second dose by mouth    Dispense:  2 tablet    Refill:  0    Order Specific Question:   Supervising Provider    Answer:   Eustace Moore [[id number]]    Pending: Labs  Reviewed  POCT URINALYSIS DIP (MANUAL ENTRY) - Abnormal; Notable for the following components:      Result Value   Spec Grav, UA >=1.030 (*)    All other components within normal limits  URINE CULTURE  RPR  HIV ANTIBODY (ROUTINE TESTING W REFLEX)  POCT URINE PREGNANCY  CERVICOVAGINAL ANCILLARY ONLY    Vaginal self-swab obtained.  We will follow up with you regarding abnormal results Given rocephin 250mg  injection and azithromycin 1g  in office HIV/ syphilis testing today Prescribed metronidazole 500 mg twice daily for 7 days (do not take while consuming alcohol and/or if breastfeeding) Prescribed diflucan 200 mg once daily and then second dose 72 hours later Take medications as prescribed and to completion If tests results are positive, please abstain from sexual activity until you and your partner(s) have been treated Follow up with PCP as needed Return here or go to ER if you have any new or worsening symptoms fever, chills, nausea, vomiting, abdominal or pelvic pain, painful intercourse, vaginal discharge, vaginal bleeding, persistent symptoms despite treatment, etc...  Reviewed expectations re: course of current medical issues. Questions answered. Outlined signs and symptoms indicating need for more acute intervention. Patient verbalized understanding. After Visit Summary given.       [name], PA-C 08/14/19 1534

## 2019-08-14 NOTE — ED Triage Notes (Signed)
Pt presents with c/o lower abdominal pain and vaginal irritation for past couple of days

## 2019-08-15 LAB — HIV ANTIBODY (ROUTINE TESTING W REFLEX): HIV Screen 4th Generation wRfx: NONREACTIVE

## 2019-08-15 LAB — RPR: RPR Ser Ql: NONREACTIVE

## 2019-08-16 LAB — URINE CULTURE
Culture: 20000 — AB
Special Requests: NORMAL

## 2019-08-19 LAB — CERVICOVAGINAL ANCILLARY ONLY
Bacterial Vaginitis (gardnerella): NEGATIVE
Candida Glabrata: NEGATIVE
Candida Vaginitis: POSITIVE — AB
Chlamydia: NEGATIVE
Comment: NEGATIVE
Comment: NEGATIVE
Comment: NEGATIVE
Comment: NEGATIVE
Comment: NEGATIVE
Comment: NORMAL
Neisseria Gonorrhea: NEGATIVE
Trichomonas: NEGATIVE

## 2019-10-28 ENCOUNTER — Encounter: Payer: Self-pay | Admitting: Emergency Medicine

## 2019-10-28 ENCOUNTER — Other Ambulatory Visit: Payer: Self-pay

## 2019-10-28 ENCOUNTER — Ambulatory Visit
Admission: EM | Admit: 2019-10-28 | Discharge: 2019-10-28 | Disposition: A | Discharge status: Home / Self Care | Payer: Medicaid Other | Attending: Emergency Medicine | Admitting: Emergency Medicine

## 2019-10-28 DIAGNOSIS — R112 Nausea with vomiting, unspecified: Secondary | ICD-10-CM

## 2019-10-28 DIAGNOSIS — Z20822 Contact with and (suspected) exposure to covid-19: Secondary | ICD-10-CM

## 2019-10-28 MED ORDER — ONDANSETRON HCL 4 MG PO TABS
4.0000 mg | ORAL_TABLET | Freq: Four times a day (QID) | ORAL | 0 refills | Status: DC
Start: 2019-10-28 — End: 2020-01-26

## 2019-10-28 NOTE — ED Provider Notes (Signed)
[facility]   [id number] 10/28/19 Arrival Time: 1225   CC: COVID symptoms  SUBJECTIVE: History from: patient.  Lindsay James is a 29 y.o. female who presents with vomiting that occurred 2 days, no longer vomiting.  Admits to COVID exposure at work.  Denies alleviating or aggravating factors.  Denies previous symptoms in the past.   Denies fever, chills, fatigue, sinus pain, rhinorrhea, sore throat, SOB, wheezing, chest pain, changes in bowel or bladder habits.    ROS: As per HPI.  All other pertinent ROS negative.     Past Medical History:  Diagnosis Date  . Asthma   . Chlamydia   . Diabetes mellitus without complication ([facility])   . Gonorrhea   . High cholesterol   . Migraine    History reviewed. No pertinent surgical history. Allergies  Allergen Reactions  . Peanut-Containing Drug Products Rash   No current facility-administered medications on file prior to encounter.   Current Outpatient Medications on File Prior to Encounter  Medication Sig Dispense Refill  . Biotin w/ Vitamins C & E (HAIR/SKIN/NAILS PO) Take 1 tablet by mouth daily.    Marland Kitchen dicyclomine (BENTYL) 20 MG tablet Take 1 tablet (20 mg total) by mouth 2 (two) times daily. 10 tablet 0  . [DISCONTINUED] promethazine (PHENERGAN) 25 MG tablet Take 1 tablet (25 mg total) by mouth every 6 (six) hours as needed for nausea or vomiting. 30 tablet 0   Social History   Socioeconomic History  . Marital status: Single    Spouse name: Not on file  . Number of children: Not on file  . Years of education: Not on file  . Highest education level: Not on file  Occupational History  . Not on file  Tobacco Use  . Smoking status: Current Some Day Smoker    Packs/day: 0.50    Types: Cigarettes  . Smokeless tobacco: Never Used  Vaping Use  . Vaping Use: Never used  Substance and Sexual Activity  . Alcohol use: Yes    Comment: socially  . Drug use: Yes    Types: Marijuana  . Sexual activity: Yes    Birth  control/protection: Implant  Other Topics Concern  . Not on file  Social History Narrative  . Not on file   Social Determinants of Health   Financial Resource Strain:   . Difficulty of Paying Living Expenses:   Food Insecurity:   . Worried About Programme researcher, broadcasting/film/video in the Last Year:   . Barista in the Last Year:   Transportation Needs:   . Freight forwarder (Medical):   Marland Kitchen Lack of Transportation (Non-Medical):   Physical Activity:   . Days of Exercise per Week:   . Minutes of Exercise per Session:   Stress:   . Feeling of Stress :   Social Connections:   . Frequency of Communication with Friends and Family:   . Frequency of Social Gatherings with Friends and Family:   . Attends Religious Services:   . Active Member of Clubs or Organizations:   . Attends Banker Meetings:   Marland Kitchen Marital Status:   Intimate Partner Violence:   . Fear of Current or Ex-Partner:   . Emotionally Abused:   Marland Kitchen Physically Abused:   . Sexually Abused:    Family History  Problem Relation Age of Onset  . Diabetes Maternal Grandmother   . Arthritis Mother   . Cancer Father     OBJECTIVE:  Vitals:  10/28/19 1234  BP: 116/70  Pulse: 69  Resp: 17  Temp: 99.1 F (37.3 C)  TempSrc: Oral  SpO2: 96%     General appearance: alert; well-appearing, nontoxic; speaking in full sentences and tolerating own secretions HEENT: NCAT; Ears: EACs clear, TMs pearly gray; Eyes: PERRL.  EOM grossly intact.Nose: nares patent without rhinorrhea, Throat: oropharynx clear, tonsils non erythematous or enlarged, uvula midline  Neck: supple without LAD Lungs: unlabored respirations, symmetrical air entry; cough: absent; no respiratory distress; CTAB Heart: regular rate and rhythm.  Abdomen: soft, nondistended, normal active bowel sounds; nontender to palpation; no guarding  Skin: warm and dry Psychological: alert and cooperative; normal mood and affect   ASSESSMENT & PLAN:  1. Exposure to  COVID-19 virus   2. Non-intractable vomiting with nausea, unspecified vomiting type     Meds ordered this encounter  Medications  . ondansetron (ZOFRAN) 4 MG tablet    Sig: Take 1 tablet (4 mg total) by mouth every 6 (six) hours.    Dispense:  12 tablet    Refill:  0    Order Specific Question:   Supervising Provider    Answer:   Eustace Moore [[id number]]   COVID testing ordered.  It will take between 2-5 days for test results.  Someone will contact you regarding abnormal results.    In the meantime: You should remain isolated in your home for 10 days from symptom onset AND greater than 72 hours after symptoms resolution (absence of fever without the use of fever-reducing medication and improvement in respiratory symptoms), whichever is longer Get plenty of rest and push fluids Zofran as needed for nausea, and vomiting Use OTC zyrtec for nasal congestion, runny nose, and/or sore throat Use OTC flonase for nasal congestion and runny nose Use medications daily for symptom relief Use OTC medications like ibuprofen or tylenol as needed fever or pain Call or go to the ED if you have any new or worsening symptoms such as fever, cough, shortness of breath, chest tightness, chest pain, turning blue, changes in mental status, etc...   Reviewed expectations re: course of current medical issues. Questions answered. Outlined signs and symptoms indicating need for more acute intervention. Patient verbalized understanding. After Visit Summary given.         [name], PA-C 10/28/19 1304

## 2019-10-28 NOTE — ED Triage Notes (Signed)
covid exposure, pt has had some episodes of vomiting

## 2019-10-28 NOTE — Discharge Instructions (Addendum)
COVID testing ordered.  It will take between 2-5 days for test results.  Someone will contact you regarding abnormal results.    In the meantime: You should remain isolated in your home for 10 days from symptom onset AND greater than 72 hours after symptoms resolution (absence of fever without the use of fever-reducing medication and improvement in respiratory symptoms), whichever is longer Get plenty of rest and push fluids Zofran as needed for nausea, and vomiting Use OTC zyrtec for nasal congestion, runny nose, and/or sore throat Use OTC flonase for nasal congestion and runny nose Use medications daily for symptom relief Use OTC medications like ibuprofen or tylenol as needed fever or pain Call or go to the ED if you have any new or worsening symptoms such as fever, cough, shortness of breath, chest tightness, chest pain, turning blue, changes in mental status, etc..Marland Kitchen

## 2019-10-29 LAB — NOVEL CORONAVIRUS, NAA: SARS-CoV-2, NAA: NOT DETECTED

## 2019-10-29 LAB — SARS-COV-2, NAA 2 DAY TAT

## 2019-12-03 ENCOUNTER — Encounter: Payer: Self-pay | Admitting: Emergency Medicine

## 2019-12-03 ENCOUNTER — Ambulatory Visit
Admission: EM | Admit: 2019-12-03 | Discharge: 2019-12-03 | Disposition: A | Discharge status: Home / Self Care | Payer: Medicaid Other | Attending: Emergency Medicine | Admitting: Emergency Medicine

## 2019-12-03 ENCOUNTER — Other Ambulatory Visit: Payer: Self-pay

## 2019-12-03 DIAGNOSIS — N939 Abnormal uterine and vaginal bleeding, unspecified: Secondary | ICD-10-CM

## 2019-12-03 DIAGNOSIS — Z3202 Encounter for pregnancy test, result negative: Secondary | ICD-10-CM

## 2019-12-03 LAB — POCT URINE PREGNANCY: Preg Test, Ur: NEGATIVE

## 2019-12-03 NOTE — ED Provider Notes (Signed)
[facility]   Chief Complaint  Patient presents with  . Vaginal Bleeding     SUBJECTIVE:  Lindsay James is a 29 y.o. female who presented to the urgent care with a complaint of abnormal vaginal bleeding for the past 4 days.  Denies any precipitating event.  Patient states she has been actively trying to get pregnant.  For the past 2 months patient states she was not menstruating.  Report heavy bleeding for the past 4 days saturating 2 pads per hour.  Denies flank or abdominal pain.  Has no tried any OTC medication.  Denies aggravating factors.  Denies similar symptoms in the past.  Denies fever, chills, nausea, vomiting, abdominal pain, flank pain, abnormal vaginal discharge, hematuria.    LMP: Patient's last menstrual period was 11/29/2019.  ROS: As in HPI.  All other pertinent ROS negative.     Past Medical History:  Diagnosis Date  . Asthma   . Chlamydia   . Diabetes mellitus without complication ([facility])   . Gonorrhea   . High cholesterol   . Migraine    History reviewed. No pertinent surgical history. Allergies  Allergen Reactions  . Peanut-Containing Drug Products Rash   No current facility-administered medications on file prior to encounter.   Current Outpatient Medications on File Prior to Encounter  Medication Sig Dispense Refill  . Biotin w/ Vitamins C & E (HAIR/SKIN/NAILS PO) Take 1 tablet by mouth daily.    Marland Kitchen dicyclomine (BENTYL) 20 MG tablet Take 1 tablet (20 mg total) by mouth 2 (two) times daily. 10 tablet 0  . ondansetron (ZOFRAN) 4 MG tablet Take 1 tablet (4 mg total) by mouth every 6 (six) hours. 12 tablet 0  . [DISCONTINUED] promethazine (PHENERGAN) 25 MG tablet Take 1 tablet (25 mg total) by mouth every 6 (six) hours as needed for nausea or vomiting. 30 tablet 0   Social History   Socioeconomic History  . Marital status: Single    Spouse name: Not on file  . Number of children: Not on file  . Years of education: Not on file  . Highest  education level: Not on file  Occupational History  . Not on file  Tobacco Use  . Smoking status: Current Some Day Smoker    Packs/day: 0.50    Types: Cigarettes  . Smokeless tobacco: Never Used  Vaping Use  . Vaping Use: Never used  Substance and Sexual Activity  . Alcohol use: Yes    Comment: socially  . Drug use: Yes    Types: Marijuana  . Sexual activity: Yes    Birth control/protection: Implant  Other Topics Concern  . Not on file  Social History Narrative  . Not on file   Social Determinants of Health   Financial Resource Strain:   . Difficulty of Paying Living Expenses: Not on file  Food Insecurity:   . Worried About Programme researcher, broadcasting/film/video in the Last Year: Not on file  . Ran Out of Food in the Last Year: Not on file  Transportation Needs:   . Lack of Transportation (Medical): Not on file  . Lack of Transportation (Non-Medical): Not on file  Physical Activity:   . Days of Exercise per Week: Not on file  . Minutes of Exercise per Session: Not on file  Stress:   . Feeling of Stress : Not on file  Social Connections:   . Frequency of Communication with Friends and Family: Not on file  . Frequency of Social Gatherings  with Friends and Family: Not on file  . Attends Religious Services: Not on file  . Active Member of Clubs or Organizations: Not on file  . Attends Banker Meetings: Not on file  . Marital Status: Not on file  Intimate Partner Violence:   . Fear of Current or Ex-Partner: Not on file  . Emotionally Abused: Not on file  . Physically Abused: Not on file  . Sexually Abused: Not on file   Family History  Problem Relation Age of Onset  . Diabetes Maternal Grandmother   . Arthritis Mother   . Cancer Father     OBJECTIVE:  Vitals:   12/03/19 1631 12/03/19 1634  BP:  107/70  Pulse:  62  Resp:  19  Temp:  98.6 F (37 C)  TempSrc:  Oral  SpO2:  98%  Weight: 288 lb 12.8 oz (131 kg)   Height: 5\' 4"  (1.626 m)    General appearance:  AOx3 in no acute distress HEENT: NCAT.  Oropharynx clear.  Lungs: clear to auscultation bilaterally without adventitious breath sounds Heart: regular rate and rhythm.  Radial pulses 2+ symmetrical bilaterally Abdomen: soft; non-distended; no tenderness; bowel sounds present; no guarding or rebound tenderness Back: no CVA tenderness Extremities: no edema; symmetrical with no gross deformities Skin: warm and dry Neurologic: Ambulates from chair to exam table without difficulty Psychological: alert and cooperative; normal mood and affect  Labs Reviewed  POCT URINE PREGNANCY    ASSESSMENT & PLAN:  1. Urine pregnancy test negative   2. Abnormal vaginal bleeding     No orders of the defined types were placed in this encounter.  Discharge instructions  POC urine pregnancy test was negative Establish and follow-up with OB/GYN Resource was provided Push fluids and get plenty of rest.   Follow up with PCP if symptoms persists Return here or go to ER if you have any new or worsening symptoms such as fever, worsening abdominal pain, nausea/vomiting, flank pain, etc...  Outlined signs and symptoms indicating need for more acute intervention. Patient verbalized understanding. After Visit Summary given.    Note: This document was prepared using Dragon voice recognition software and may include unintentional dictation errors.    , FNP 12/03/19 1714

## 2019-12-03 NOTE — ED Triage Notes (Addendum)
Pt had not had a menstrul cycle x 45months. Pt did start her period this Sunday and reports heavy vaginal bleeding but it has gotten better today. Pt states she is trying to get pregnant and has taken several preg test at home which have been neg.  Would like another preg test.

## 2019-12-03 NOTE — Discharge Instructions (Addendum)
POC urine pregnancy test was negative Establish and follow-up with OB/GYN Resource was provided Push fluids and get plenty of rest.   Follow up with PCP if symptoms persists Return here or go to ER if you have any new or worsening symptoms such as fever, worsening abdominal pain, nausea/vomiting, flank pain, etc..Marland Kitchen

## 2020-01-08 ENCOUNTER — Other Ambulatory Visit: Payer: Self-pay

## 2020-01-08 ENCOUNTER — Other Ambulatory Visit (HOSPITAL_COMMUNITY): Payer: Self-pay

## 2020-01-08 ENCOUNTER — Ambulatory Visit (INDEPENDENT_AMBULATORY_CARE_PROVIDER_SITE_OTHER): Payer: Self-pay | Admitting: Obstetrics & Gynecology

## 2020-01-08 ENCOUNTER — Encounter: Payer: Self-pay | Admitting: Obstetrics & Gynecology

## 2020-01-08 ENCOUNTER — Other Ambulatory Visit (HOSPITAL_COMMUNITY)
Admission: RE | Admit: 2020-01-08 | Discharge: 2020-01-08 | Disposition: A | Discharge status: Home / Self Care | Payer: PRIVATE HEALTH INSURANCE | Source: Ambulatory Visit | Attending: Obstetrics & Gynecology | Admitting: Obstetrics & Gynecology

## 2020-01-08 VITALS — BP 124/72 | HR 69 | Ht 64.0 in | Wt 284.8 lb

## 2020-01-08 DIAGNOSIS — A5901 Trichomonal vulvovaginitis: Secondary | ICD-10-CM | POA: Diagnosis not present

## 2020-01-08 DIAGNOSIS — F32A Depression, unspecified: Secondary | ICD-10-CM

## 2020-01-08 DIAGNOSIS — N898 Other specified noninflammatory disorders of vagina: Secondary | ICD-10-CM | POA: Diagnosis present

## 2020-01-08 DIAGNOSIS — B9689 Other specified bacterial agents as the cause of diseases classified elsewhere: Secondary | ICD-10-CM | POA: Diagnosis not present

## 2020-01-08 DIAGNOSIS — N760 Acute vaginitis: Secondary | ICD-10-CM | POA: Insufficient documentation

## 2020-01-08 MED ORDER — MEDROXYPROGESTERONE ACETATE 10 MG PO TABS: 10.0000 mg | ORAL_TABLET | Freq: Every day | ORAL | 1 refills | Status: DC

## 2020-01-08 NOTE — Progress Notes (Signed)
Patient ID: Lindsay James, female   DOB: Feb 09, 1991, 29 y.o.   MRN: [id number]  Chief Complaint  Patient presents with  . Gynecologic Exam    HPI Lindsay James is a 29 y.o. female.  G1P0 Patient has a h/o DUB and irregular menses. She has hoped to conceive with her present partner, using no BCM. She has used [facility] in the past and she had BTB. HPI  Past Medical History:  Diagnosis Date  . Asthma   . Chlamydia   . Diabetes mellitus without complication ([facility])   . Gonorrhea   . High cholesterol   . Migraine     No past surgical history on file.  Family History  Problem Relation Age of Onset  . Diabetes Maternal Grandmother   . Arthritis Mother   . Cancer Father     Social History Social History   Tobacco Use  . Smoking status: Current Some Day Smoker    Packs/day: 0.50    Types: Cigarettes  . Smokeless tobacco: Never Used  Vaping Use  . Vaping Use: Never used  Substance Use Topics  . Alcohol use: Yes    Comment: socially  . Drug use: Yes    Types: Marijuana    Allergies  Allergen Reactions  . Peanut-Containing Drug Products Rash    Current Outpatient Medications  Medication Sig Dispense Refill  . acetaminophen (TYLENOL) 325 MG tablet Take 650 mg by mouth every 6 (six) hours as needed.    . Biotin w/ Vitamins C & E (HAIR/SKIN/NAILS PO) Take 1 tablet by mouth daily.    Marland Kitchen dicyclomine (BENTYL) 20 MG tablet Take 1 tablet (20 mg total) by mouth 2 (two) times daily. (Patient not taking: Reported on 01/08/2020) 10 tablet 0  . medroxyPROGESTERone (PROVERA) 10 MG tablet Take 1 tablet (10 mg total) by mouth daily. For 10 days if more than 6 weeks since last period 30 tablet 1  . ondansetron (ZOFRAN) 4 MG tablet Take 1 tablet (4 mg total) by mouth every 6 (six) hours. (Patient not taking: Reported on 01/08/2020) 12 tablet 0   No current facility-administered medications for this visit.    Review of Systems Review of Systems  Constitutional: Negative.  Negative for  unexpected weight change.  Respiratory: Negative.   Gastrointestinal: Negative.   Genitourinary: Positive for menstrual problem (irregualr menses that last 3-4 days with cramps, spaceed up to 2 to 3 months), pelvic pain (cramps) and vaginal discharge.   Pelvic and pap 02/2019 by her report Blood pressure 124/72, pulse 69, height 5\' 4"  (1.626 m), weight 284 lb 12.8 oz (129.2 kg), last menstrual period 01/01/2020, unknown if currently breastfeeding.  Physical Exam Physical Exam Constitutional:      Appearance: She is obese. She is not ill-appearing.  Cardiovascular:     Rate and Rhythm: Normal rate.  Pulmonary:     Effort: Pulmonary effort is normal.     Data Reviewed   Assessment Discharge from the vagina - Plan: Cervicovaginal ancillary only( [place]), medroxyPROGESTERone (PROVERA) 10 MG tablet  Vaginal odor - Plan: Cervicovaginal ancillary only( [place]) discharge reported and self swab done   Plan Provera for 10 days if her menses > 2 weeks late, rtc to discuss result and consider if further labs are needed. Suspect oligoovulation    01/03/2020 01/08/2020, 10:56 PM

## 2020-01-08 NOTE — Patient Instructions (Signed)
Dysfunctional Uterine Bleeding Dysfunctional uterine bleeding is abnormal bleeding from the uterus. Dysfunctional uterine bleeding includes:  A menstrual period that comes earlier or later than usual.  A menstrual period that is lighter or heavier than usual, or has large blood clots.  Vaginal bleeding between menstrual periods.  Skipping one or more menstrual periods.  Vaginal bleeding after sex.  Vaginal bleeding after menopause. Follow these instructions at home: Eating and drinking   Eat well-balanced meals. Include foods that are high in iron, such as liver, meat, shellfish, green leafy vegetables, and eggs.  To prevent or treat constipation, your health care provider may recommend that you: ? Drink enough fluid to keep your urine pale yellow. ? Take over-the-counter or prescription medicines. ? Eat foods that are high in fiber, such as beans, whole grains, and fresh fruits and vegetables. ? Limit foods that are high in fat and processed sugars, such as fried or sweet foods. Medicines  Take over-the-counter and prescription medicines only as told by your health care provider.  Do not change medicines without talking with your health care provider.  Aspirin or medicines that contain aspirin may make the bleeding worse. Do not take those medicines: ? During the week before your menstrual period. ? During your menstrual period.  If you were prescribed iron pills, take them as told by your health care provider. Iron pills help to replace iron that your body loses because of this condition. Activity  If you need to change your sanitary pad or tampon more than one time every 2 hours: ? Lie in bed with your feet raised (elevated). ? Place a cold pack on your lower abdomen. ? Rest as much as possible until the bleeding stops or slows down.  Do not try to lose weight until the bleeding has stopped and your blood iron level is back to normal. General instructions   For two  months, write down: ? When your menstrual period starts. ? When your menstrual period ends. ? When any abnormal vaginal bleeding occurs. ? What problems you notice.  Keep all follow up visits as told by your health care provider. This is important. Contact a health care provider if you:  Feel light-headed or weak.  Have nausea and vomiting.  Cannot eat or drink without vomiting.  Feel dizzy or have diarrhea while you are taking medicines.  Are taking birth control pills or hormones, and you want to change them or stop taking them. Get help right away if:  You develop a fever or chills.  You need to change your sanitary pad or tampon more than one time per hour.  Your vaginal bleeding becomes heavier, or your flow contains clots more often.  You develop pain in your abdomen.  You lose consciousness.  You develop a rash. Summary  Dysfunctional uterine bleeding is abnormal bleeding from the uterus.  It includes menstrual bleeding of abnormal duration, volume, or regularity.  Bleeding after sex and after menopause are also considered dysfunctional uterine bleeding. This information is not intended to replace advice given to you by your health care provider. Make sure you discuss any questions you have with your health care provider. Document Revised: 08/14/2017 Document Reviewed: 08/14/2017 Elsevier Patient Education  2020 Elsevier Inc.  

## 2020-01-11 LAB — CERVICOVAGINAL ANCILLARY ONLY
Bacterial Vaginitis (gardnerella): POSITIVE — AB
Candida Glabrata: NEGATIVE
Candida Vaginitis: NEGATIVE
Chlamydia: NEGATIVE
Comment: NEGATIVE
Comment: NEGATIVE
Comment: NEGATIVE
Comment: NEGATIVE
Comment: NEGATIVE
Comment: NORMAL
Neisseria Gonorrhea: NEGATIVE
Trichomonas: POSITIVE — AB

## 2020-01-11 NOTE — BH Specialist Note (Deleted)
Integrated Behavioral Health via Telemedicine Video (Caregility) Visit  01/11/2020 Lindsay James [id number]  Number of Integrated Behavioral Health visits: 1 Session Start time: 9:15***  Session End time: 10:15*** Total time: {IBH Total Time:21014050} minutes  Referring Provider: Scheryl Darter, MD Type of Service: Individual, Family, *** Patient/Family location: Home [facility] Provider location: [facility] [employer] [facility]  All persons participating in visit: Patient *** and Lindsay James ***    I connected with Lindsay James and/or Lindsay James's {family members:20773} by a video enabled telemedicine application (Caregility) and verified that I am speaking with the correct person using two identifiers.   Discussed confidentiality: {YES/NO:21197}  Confirmed demographics & insurance:  {YES/NO:21197}  I discussed that engaging in this virtual visit, they consent to the provision of behavioral healthcare and the services will be billed under their insurance.   Patient and/or legal guardian expressed understanding and consented to virtual visit: {YES/NO:21197}  PRESENTING CONCERNS: Patient and/or family reports the following symptoms/concerns: *** Duration of problem: ***; Severity of problem: {Mild/Moderate/Severe:20260}  STRENGTHS (Protective Factors/Coping Skills): {CHL AMB BH PROTECTIVE FACTORS/STRENGTHS:[phone]}  ASSESSMENT: Patient currently experiencing ***.    GOALS ADDRESSED: Patient will: 1.  Reduce symptoms of: {IBH Symptoms:21014056}  2.  Increase knowledge and/or ability of: {IBH Patient Tools:21014057}  3.  Demonstrate ability to: {IBH Goals:21014053}   Progress of Goals: {CHL AMB BH PROGRESS TOWARDS FGHWE:[id number]}  INTERVENTIONS: Interventions utilized:  {IBH Interventions:21014054} Standardized Assessments completed & reviewed: {IBH Screening Tools:21014051}   OUTCOME: Patient Response:  ***   PLAN: 1. Follow up with behavioral health clinician on : *** 2. Behavioral recommendations: *** 3. Referral(s): {IBH Referrals:21014055}  I discussed the assessment and treatment plan with the patient and/or parent/guardian. They were provided an opportunity to ask questions and all were answered. They agreed with the plan and demonstrated an understanding of the instructions.   They were advised to call back or seek an in-person evaluation as appropriate.  I discussed that the purpose of this visit is to provide behavioral health care while limiting exposure to the novel coronavirus.  Discussed there is a possibility of technology failure and discussed alternative modes of communication if that failure occurs.  Lindsay James  Depression screen [facility] 2/9 01/08/2020 04/27/2014 03/30/2014  Decreased Interest 2 0 0  Down, Depressed, Hopeless 2 0 0  PHQ - 2 Score 4 0 0  Altered sleeping 2 - -  Tired, decreased energy 2 - -  Change in appetite 2 - -  Feeling bad or failure about yourself  2 - -  Trouble concentrating 2 - -  Moving slowly or fidgety/restless 2 - -  Suicidal thoughts 0 - -  PHQ-9 Score 16 - -   GAD 7 : Generalized Anxiety Score 01/08/2020  Nervous, Anxious, on Edge 2  Control/stop worrying 2  Worry too much - different things 2  Trouble relaxing 2  Restless 0  Easily annoyed or irritable 2  Afraid - awful might happen 2  Total GAD 7 Score 12

## 2020-01-13 ENCOUNTER — Other Ambulatory Visit (HOSPITAL_COMMUNITY): Payer: Self-pay | Admitting: Obstetrics & Gynecology

## 2020-01-13 DIAGNOSIS — N898 Other specified noninflammatory disorders of vagina: Secondary | ICD-10-CM

## 2020-01-13 MED ORDER — METRONIDAZOLE 500 MG PO TABS: 500.0000 mg | ORAL_TABLET | Freq: Two times a day (BID) | ORAL | 0 refills | Status: AC

## 2020-01-17 ENCOUNTER — Other Ambulatory Visit: Payer: Self-pay

## 2020-01-17 ENCOUNTER — Ambulatory Visit (HOSPITAL_COMMUNITY)
Admission: EM | Admit: 2020-01-17 | Discharge: 2020-01-18 | Disposition: A | Discharge status: Home / Self Care | Payer: Self-pay | Attending: General Surgery | Admitting: General Surgery

## 2020-01-17 ENCOUNTER — Encounter (HOSPITAL_COMMUNITY): Payer: Self-pay

## 2020-01-17 DIAGNOSIS — E119 Type 2 diabetes mellitus without complications: Secondary | ICD-10-CM | POA: Insufficient documentation

## 2020-01-17 DIAGNOSIS — Z20822 Contact with and (suspected) exposure to covid-19: Secondary | ICD-10-CM | POA: Insufficient documentation

## 2020-01-17 DIAGNOSIS — Z79899 Other long term (current) drug therapy: Secondary | ICD-10-CM | POA: Insufficient documentation

## 2020-01-17 DIAGNOSIS — J45909 Unspecified asthma, uncomplicated: Secondary | ICD-10-CM | POA: Insufficient documentation

## 2020-01-17 DIAGNOSIS — F17210 Nicotine dependence, cigarettes, uncomplicated: Secondary | ICD-10-CM | POA: Insufficient documentation

## 2020-01-17 DIAGNOSIS — Z793 Long term (current) use of hormonal contraceptives: Secondary | ICD-10-CM | POA: Insufficient documentation

## 2020-01-17 DIAGNOSIS — Z91010 Allergy to peanuts: Secondary | ICD-10-CM | POA: Insufficient documentation

## 2020-01-17 DIAGNOSIS — Z8261 Family history of arthritis: Secondary | ICD-10-CM | POA: Insufficient documentation

## 2020-01-17 DIAGNOSIS — Z809 Family history of malignant neoplasm, unspecified: Secondary | ICD-10-CM | POA: Insufficient documentation

## 2020-01-17 DIAGNOSIS — E7800 Pure hypercholesterolemia, unspecified: Secondary | ICD-10-CM | POA: Insufficient documentation

## 2020-01-17 DIAGNOSIS — K3580 Unspecified acute appendicitis: Secondary | ICD-10-CM | POA: Insufficient documentation

## 2020-01-17 DIAGNOSIS — Z833 Family history of diabetes mellitus: Secondary | ICD-10-CM | POA: Insufficient documentation

## 2020-01-17 NOTE — ED Triage Notes (Signed)
Pt presents to ED with complaints of abdominal cramping started around 1400. Pt report N/V/D. Vomited x 1. Diarrhea x 3

## 2020-01-18 ENCOUNTER — Emergency Department (HOSPITAL_COMMUNITY): Payer: Self-pay

## 2020-01-18 ENCOUNTER — Other Ambulatory Visit: Payer: Self-pay

## 2020-01-18 ENCOUNTER — Encounter (HOSPITAL_COMMUNITY)
Admission: EM | Disposition: A | Discharge status: Home / Self Care | Payer: Self-pay | Source: Home / Self Care | Attending: Emergency Medicine

## 2020-01-18 ENCOUNTER — Encounter (HOSPITAL_COMMUNITY): Payer: Self-pay

## 2020-01-18 DIAGNOSIS — K353 Acute appendicitis with localized peritonitis, without perforation or gangrene: Secondary | ICD-10-CM

## 2020-01-18 DIAGNOSIS — K3580 Unspecified acute appendicitis: Secondary | ICD-10-CM

## 2020-01-18 HISTORY — PX: LAPAROSCOPIC APPENDECTOMY: SHX408

## 2020-01-18 LAB — COMPREHENSIVE METABOLIC PANEL
ALT: 13 U/L (ref 0–44)
AST: 13 U/L — ABNORMAL LOW (ref 15–41)
Albumin: 3.4 g/dL — ABNORMAL LOW (ref 3.5–5.0)
Alkaline Phosphatase: 62 U/L (ref 38–126)
Anion gap: 7 (ref 5–15)
BUN: 14 mg/dL (ref 6–20)
CO2: 23 mmol/L (ref 22–32)
Calcium: 8.7 mg/dL — ABNORMAL LOW (ref 8.9–10.3)
Chloride: 104 mmol/L (ref 98–111)
Creatinine, Ser: 0.94 mg/dL (ref 0.44–1.00)
GFR, Estimated: >60 mL/min (ref >60)
Glucose, Bld: 85 mg/dL (ref 70–99)
Potassium: 3.5 mmol/L (ref 3.5–5.1)
Sodium: 134 mmol/L — ABNORMAL LOW (ref 135–145)
Total Bilirubin: 0.8 mg/dL (ref 0.3–1.2)
Total Protein: 6.8 g/dL (ref 6.5–8.1)

## 2020-01-18 LAB — HCG, QUANTITATIVE, PREGNANCY: hCG, Beta Chain, Quant, S: <1 m[IU]/mL (ref <5)

## 2020-01-18 LAB — CBC
HCT: 38.6 % (ref 36.0–46.0)
Hemoglobin: 12.2 g/dL (ref 12.0–15.0)
MCH: 23.7 pg — ABNORMAL LOW (ref 26.0–34.0)
MCHC: 31.6 g/dL (ref 30.0–36.0)
MCV: 75 fL — ABNORMAL LOW (ref 80.0–100.0)
Platelets: 228 10*3/uL (ref 150–400)
RBC: 5.15 MIL/uL — ABNORMAL HIGH (ref 3.87–5.11)
RDW: 14.5 % (ref 11.5–15.5)
WBC: 21.6 10*3/uL — ABNORMAL HIGH (ref 4.0–10.5)
nRBC: 0 % (ref 0.0–0.2)

## 2020-01-18 LAB — GLUCOSE, CAPILLARY: Glucose-Capillary: 99 mg/dL (ref 70–99)

## 2020-01-18 LAB — LIPASE, BLOOD: Lipase: 18 U/L (ref 11–51)

## 2020-01-18 LAB — RESPIRATORY PANEL BY RT PCR (FLU A&B, COVID)
Influenza A by PCR: NEGATIVE
Influenza B by PCR: NEGATIVE
SARS Coronavirus 2 by RT PCR: NEGATIVE

## 2020-01-18 SURGERY — APPENDECTOMY, LAPAROSCOPIC
Anesthesia: General | Site: Abdomen

## 2020-01-18 MED ORDER — ONDANSETRON HCL 4 MG/2ML IJ SOLN: 4.0000 mg | Freq: Once | INTRAMUSCULAR | Status: DC | PRN

## 2020-01-18 MED ORDER — FENTANYL CITRATE (PF) 100 MCG/2ML IJ SOLN
25.0000 ug | INTRAMUSCULAR | Status: DC | PRN
  Administered 2020-01-18 (×2): 50 ug via INTRAVENOUS
  Administered 2020-01-18: 25 ug via INTRAVENOUS
  Filled 2020-01-18 (×2): qty 2

## 2020-01-18 MED ORDER — IOHEXOL 300 MG/ML  SOLN
100.0000 mL | Freq: Once | INTRAMUSCULAR | Status: AC | PRN
  Administered 2020-01-18: 100 mL via INTRAVENOUS

## 2020-01-18 MED ORDER — DICYCLOMINE HCL 10 MG/ML IM SOLN
20.0000 mg | Freq: Once | INTRAMUSCULAR | Status: AC
  Administered 2020-01-18: 20 mg via INTRAMUSCULAR
  Filled 2020-01-18: qty 2

## 2020-01-18 MED ORDER — LACTATED RINGERS IV SOLN: INTRAVENOUS | Status: DC

## 2020-01-18 MED ORDER — BUPIVACAINE LIPOSOME 1.3 % IJ SUSP
INTRAMUSCULAR | Status: DC | PRN
  Administered 2020-01-18: 20 mL

## 2020-01-18 MED ORDER — ONDANSETRON HCL 4 MG/2ML IJ SOLN
4.0000 mg | Freq: Once | INTRAMUSCULAR | Status: AC
  Administered 2020-01-18: 4 mg via INTRAVENOUS
  Filled 2020-01-18: qty 2

## 2020-01-18 MED ORDER — FENTANYL CITRATE (PF) 100 MCG/2ML IJ SOLN: 50.0000 ug | INTRAMUSCULAR | Status: DC | PRN

## 2020-01-18 MED ORDER — ONDANSETRON HCL 4 MG/2ML IJ SOLN
INTRAMUSCULAR | Status: DC | PRN
  Administered 2020-01-18: 4 mg via INTRAVENOUS

## 2020-01-18 MED ORDER — FENTANYL CITRATE (PF) 100 MCG/2ML IJ SOLN
INTRAMUSCULAR | Status: AC
  Filled 2020-01-18: qty 2

## 2020-01-18 MED ORDER — HYDROCODONE-ACETAMINOPHEN 5-325 MG PO TABS
1.0000 | ORAL_TABLET | ORAL | 0 refills | Status: DC | PRN
Start: 2020-01-18 — End: 2021-07-08

## 2020-01-18 MED ORDER — BUPIVACAINE LIPOSOME 1.3 % IJ SUSP
INTRAMUSCULAR | Status: AC
  Filled 2020-01-18: qty 20

## 2020-01-18 MED ORDER — SODIUM CHLORIDE FLUSH 0.9 % IV SOLN
INTRAVENOUS | Status: AC
  Filled 2020-01-18: qty 10

## 2020-01-18 MED ORDER — ROCURONIUM BROMIDE 10 MG/ML (PF) SYRINGE
PREFILLED_SYRINGE | INTRAVENOUS | Status: DC | PRN
  Administered 2020-01-18: 5 mg via INTRAVENOUS
  Administered 2020-01-18: 20 mg via INTRAVENOUS

## 2020-01-18 MED ORDER — MIDAZOLAM HCL 2 MG/2ML IJ SOLN
INTRAMUSCULAR | Status: AC
  Filled 2020-01-18: qty 2

## 2020-01-18 MED ORDER — DEXAMETHASONE SODIUM PHOSPHATE 10 MG/ML IJ SOLN
INTRAMUSCULAR | Status: DC | PRN
  Administered 2020-01-18: 5 mg via INTRAVENOUS

## 2020-01-18 MED ORDER — METRONIDAZOLE IN NACL 5-0.79 MG/ML-% IV SOLN
500.0000 mg | Freq: Once | INTRAVENOUS | Status: AC
  Administered 2020-01-18: 500 mg via INTRAVENOUS
  Filled 2020-01-18: qty 100

## 2020-01-18 MED ORDER — MIDAZOLAM HCL 2 MG/2ML IJ SOLN
INTRAMUSCULAR | Status: DC | PRN
  Administered 2020-01-18: 2 mg via INTRAVENOUS

## 2020-01-18 MED ORDER — FENTANYL CITRATE (PF) 100 MCG/2ML IJ SOLN
INTRAMUSCULAR | Status: DC | PRN
  Administered 2020-01-18: 50 ug via INTRAVENOUS
  Administered 2020-01-18: 100 ug via INTRAVENOUS
  Administered 2020-01-18: 50 ug via INTRAVENOUS

## 2020-01-18 MED ORDER — PHENYLEPHRINE 40 MCG/ML (10ML) SYRINGE FOR IV PUSH (FOR BLOOD PRESSURE SUPPORT)
PREFILLED_SYRINGE | INTRAVENOUS | Status: AC
  Filled 2020-01-18: qty 10

## 2020-01-18 MED ORDER — ORAL CARE MOUTH RINSE: 15.0000 mL | Freq: Once | OROMUCOSAL | Status: AC

## 2020-01-18 MED ORDER — SUGAMMADEX SODIUM 500 MG/5ML IV SOLN
INTRAVENOUS | Status: AC
  Filled 2020-01-18: qty 5

## 2020-01-18 MED ORDER — ROCURONIUM BROMIDE 10 MG/ML (PF) SYRINGE
PREFILLED_SYRINGE | INTRAVENOUS | Status: AC
  Filled 2020-01-18: qty 10

## 2020-01-18 MED ORDER — DEXAMETHASONE SODIUM PHOSPHATE 10 MG/ML IJ SOLN
INTRAMUSCULAR | Status: AC
  Filled 2020-01-18: qty 1

## 2020-01-18 MED ORDER — CHLORHEXIDINE GLUCONATE 0.12 % MT SOLN
15.0000 mL | Freq: Once | OROMUCOSAL | Status: AC
  Administered 2020-01-18: 15 mL via OROMUCOSAL

## 2020-01-18 MED ORDER — PROPOFOL 10 MG/ML IV BOLUS
INTRAVENOUS | Status: DC | PRN
  Administered 2020-01-18: 200 mg via INTRAVENOUS

## 2020-01-18 MED ORDER — SODIUM CHLORIDE 0.9 % IV BOLUS
1000.0000 mL | Freq: Once | INTRAVENOUS | Status: AC
  Administered 2020-01-18: 1000 mL via INTRAVENOUS

## 2020-01-18 MED ORDER — SODIUM CHLORIDE 0.9 % IV SOLN
2.0000 g | Freq: Once | INTRAVENOUS | Status: AC
  Administered 2020-01-18: 2 g via INTRAVENOUS
  Filled 2020-01-18: qty 20

## 2020-01-18 MED ORDER — SUCCINYLCHOLINE CHLORIDE 200 MG/10ML IV SOSY
PREFILLED_SYRINGE | INTRAVENOUS | Status: DC | PRN
  Administered 2020-01-18: 160 mg via INTRAVENOUS

## 2020-01-18 MED ORDER — SUGAMMADEX SODIUM 500 MG/5ML IV SOLN
INTRAVENOUS | Status: DC | PRN
  Administered 2020-01-18: 300 mg via INTRAVENOUS

## 2020-01-18 MED ORDER — ONDANSETRON HCL 4 MG/2ML IJ SOLN
INTRAMUSCULAR | Status: AC
  Filled 2020-01-18: qty 2

## 2020-01-18 MED ORDER — SODIUM CHLORIDE 0.9 % IV SOLN
INTRAVENOUS | Status: DC | PRN
  Administered 2020-01-18 (×2): 80 ug via INTRAVENOUS

## 2020-01-18 MED ORDER — LIDOCAINE 2% (20 MG/ML) 5 ML SYRINGE
INTRAMUSCULAR | Status: AC
  Filled 2020-01-18: qty 5

## 2020-01-18 MED ORDER — SODIUM CHLORIDE 0.9 % IR SOLN
Status: DC | PRN
  Administered 2020-01-18: 1000 mL

## 2020-01-18 MED ORDER — KETOROLAC TROMETHAMINE 15 MG/ML IJ SOLN
INTRAMUSCULAR | Status: DC | PRN
  Administered 2020-01-18: 30 mg via INTRAVENOUS

## 2020-01-18 MED ORDER — KETOROLAC TROMETHAMINE 30 MG/ML IJ SOLN
INTRAMUSCULAR | Status: AC
  Filled 2020-01-18: qty 1

## 2020-01-18 MED ORDER — FAMOTIDINE IN NACL 20-0.9 MG/50ML-% IV SOLN
20.0000 mg | Freq: Once | INTRAVENOUS | Status: AC
  Administered 2020-01-18: 20 mg via INTRAVENOUS
  Filled 2020-01-18: qty 50

## 2020-01-18 MED ORDER — LIDOCAINE HCL (CARDIAC) PF 100 MG/5ML IV SOSY
PREFILLED_SYRINGE | INTRAVENOUS | Status: DC | PRN
  Administered 2020-01-18: 80 mg via INTRATRACHEAL

## 2020-01-18 SURGICAL SUPPLY — 45 items
BAG RETRIEVAL 10 (BASKET) ×1
BAG RETRIEVAL 10MM (BASKET) ×1
CATH FOLEY 2WAY SLVR  5CC 14FR (CATHETERS) ×2
CATH FOLEY 2WAY SLVR 5CC 14FR (CATHETERS) ×1 IMPLANT
CHLORAPREP W/TINT 26 (MISCELLANEOUS) ×3 IMPLANT
CLOTH BEACON ORANGE TIMEOUT ST (SAFETY) ×3 IMPLANT
COVER LIGHT HANDLE STERIS (MISCELLANEOUS) ×6 IMPLANT
COVER WAND RF STERILE (DRAPES) ×3 IMPLANT
CUTTER FLEX LINEAR 45M (STAPLE) ×3 IMPLANT
DERMABOND ADVANCED (GAUZE/BANDAGES/DRESSINGS) ×2
DERMABOND ADVANCED .7 DNX12 (GAUZE/BANDAGES/DRESSINGS) ×1 IMPLANT
ELECT REM PT RETURN 9FT ADLT (ELECTROSURGICAL) ×3
ELECTRODE REM PT RTRN 9FT ADLT (ELECTROSURGICAL) ×1 IMPLANT
GLOVE BIOGEL PI IND STRL 7.0 (GLOVE) ×3 IMPLANT
GLOVE BIOGEL PI INDICATOR 7.0 (GLOVE) ×6
GLOVE SURG SS PI 7.5 STRL IVOR (GLOVE) ×3 IMPLANT
GOWN STRL REUS W/TWL LRG LVL3 (GOWN DISPOSABLE) ×9 IMPLANT
INST SET LAPROSCOPIC AP (KITS) ×3 IMPLANT
KIT TURNOVER KIT A (KITS) ×3 IMPLANT
MANIFOLD NEPTUNE II (INSTRUMENTS) ×3 IMPLANT
NEEDLE HYPO 18GX1.5 BLUNT FILL (NEEDLE) ×3 IMPLANT
NEEDLE HYPO 21X1.5 SAFETY (NEEDLE) ×3 IMPLANT
NEEDLE INSUFFLATION 14GA 120MM (NEEDLE) ×3 IMPLANT
NS IRRIG 1000ML POUR BTL (IV SOLUTION) ×3 IMPLANT
PACK LAP CHOLE LZT030E (CUSTOM PROCEDURE TRAY) ×3 IMPLANT
PAD ARMBOARD 7.5X6 YLW CONV (MISCELLANEOUS) ×3 IMPLANT
PENCIL SMOKE EVACUATOR COATED (MISCELLANEOUS) ×3 IMPLANT
RELOAD STAPLE TA45 3.5 REG BLU (ENDOMECHANICALS) ×3 IMPLANT
SET BASIN LINEN APH (SET/KITS/TRAYS/PACK) ×3 IMPLANT
SET TUBE IRRIG SUCTION NO TIP (IRRIGATION / IRRIGATOR) IMPLANT
SET TUBE SMOKE EVAC HIGH FLOW (TUBING) ×3 IMPLANT
SHEARS HARMONIC ACE PLUS 36CM (ENDOMECHANICALS) ×3 IMPLANT
SUT MNCRL AB 4-0 PS2 18 (SUTURE) ×6 IMPLANT
SUT VICRYL 0 UR6 27IN ABS (SUTURE) ×3 IMPLANT
SYR 20ML LL LF (SYRINGE) ×6 IMPLANT
SYS BAG RETRIEVAL 10MM (BASKET) ×1
SYSTEM BAG RETRIEVAL 10MM (BASKET) ×1 IMPLANT
TRAY FOLEY W/BAG SLVR 16FR (SET/KITS/TRAYS/PACK) ×2
TRAY FOLEY W/BAG SLVR 16FR ST (SET/KITS/TRAYS/PACK) ×1 IMPLANT
TROCAR ENDO BLADELESS 11MM (ENDOMECHANICALS) ×3 IMPLANT
TROCAR ENDO BLADELESS 12MM (ENDOMECHANICALS) ×3 IMPLANT
TROCAR XCEL NON-BLD 5MMX100MML (ENDOMECHANICALS) ×3 IMPLANT
TUBE CONNECTING 12'X1/4 (SUCTIONS) ×1
TUBE CONNECTING 12X1/4 (SUCTIONS) ×2 IMPLANT
WARMER LAPAROSCOPE (MISCELLANEOUS) ×3 IMPLANT

## 2020-01-18 NOTE — Anesthesia Preprocedure Evaluation (Signed)
Anesthesia Evaluation  Patient identified by MRN, date of birth, ID band Patient awake    Reviewed: Allergy & Precautions, H&P , NPO status , Patient's Chart, lab work & pertinent test results, reviewed documented beta blocker date and time   Airway Mallampati: II  TM Distance: >3 FB Neck ROM: full    Dental no notable dental hx. (+) Teeth Intact   Pulmonary asthma , Current Smoker,    Pulmonary exam normal breath sounds clear to auscultation       Cardiovascular Exercise Tolerance: Good negative cardio ROS   Rhythm:regular Rate:Normal     Neuro/Psych  Headaches, negative psych ROS   GI/Hepatic negative GI ROS, Neg liver ROS,   Endo/Other  negative endocrine ROSdiabetes  Renal/GU negative Renal ROS  negative genitourinary   Musculoskeletal   Abdominal   Peds  Hematology negative hematology ROS (+)   Anesthesia Other Findings   Reproductive/Obstetrics negative OB ROS                             Anesthesia Physical Anesthesia Plan  ASA: II and emergent  Anesthesia Plan: General   Post-op Pain Management:    Induction:   PONV Risk Score and Plan: Ondansetron  Airway Management Planned:   Additional Equipment:   Intra-op Plan:   Post-operative Plan:   Informed Consent: I have reviewed the patients History and Physical, chart, labs and discussed the procedure including the risks, benefits and alternatives for the proposed anesthesia with the patient or authorized representative who has indicated his/her understanding and acceptance.     Dental Advisory Given  Plan Discussed with: CRNA  Anesthesia Plan Comments:         Anesthesia Quick Evaluation

## 2020-01-18 NOTE — Progress Notes (Signed)
Please excuse Lindsay James from work today through Wednesday January 27, 2020 or as deemed acceptable to return by Physician. SHe cannot drive,operate machinery, or sign legal documents for 24 hours or if taking pain medications.  She may return to work when determined by Careers adviser.

## 2020-01-18 NOTE — H&P (Signed)
Lindsay James is an 29 y.o. female.   Chief Complaint: Right lower quadrant abdominal pain HPI: Patient is a 29 year old black female who presented to the emergency room with a 12-hour history of worsening lower abdominal pain.  CT scan of the abdomen reveals acute appendicitis without perforation.  Past Medical History:  Diagnosis Date  . Asthma   . Chlamydia   . Diabetes mellitus without complication (HCC)   . Gonorrhea   . High cholesterol   . Migraine     History reviewed. No pertinent surgical history.  Family History  Problem Relation Age of Onset  . Diabetes Maternal Grandmother   . Arthritis Mother   . Cancer Father    Social History:  reports that she has been smoking cigarettes. She has been smoking about 0.50 packs per day. She has never used smokeless tobacco. She reports current alcohol use. She reports current drug use. Drug: Marijuana.  Allergies:  Allergies  Allergen Reactions  . Peanut-Containing Drug Products Rash    (Not in a hospital admission)   Results for orders placed or performed during the hospital encounter of 01/17/20 (from the past 48 hour(s))  Lipase, blood     Status: None   Collection Time: 01/18/20  1:15 AM  Result Value Ref Range   Lipase 18 11 - 51 U/L    Comment: Performed at [facility], [address], [place], [place] [address]  Comprehensive metabolic panel     Status: Abnormal   Collection Time: 01/18/20  1:15 AM  Result Value Ref Range   Sodium 134 (L) 135 - 145 mmol/L   Potassium 3.5 3.5 - 5.1 mmol/L   Chloride 104 98 - 111 mmol/L   CO2 23 22 - 32 mmol/L   Glucose, Bld 85 70 - 99 mg/dL    Comment: Glucose reference range applies only to samples taken after fasting for at least 8 hours.   BUN 14 6 - 20 mg/dL   Creatinine, Ser 5.18 0.44 - 1.00 mg/dL   Calcium 8.7 (L) 8.9 - 10.3 mg/dL   Total Protein 6.8 6.5 - 8.1 g/dL   Albumin 3.4 (L) 3.5 - 5.0 g/dL   AST 13 (L) 15 - 41 U/L   ALT 13 0 - 44 U/L   Alkaline Phosphatase  62 38 - 126 U/L   Total Bilirubin 0.8 0.3 - 1.2 mg/dL   GFR, Estimated >84 >16 mL/min    Comment: (NOTE) Calculated using the CKD-EPI Creatinine Equation (2021)    Anion gap 7 5 - 15    Comment: Performed at [facility], [address] [place], [place] [address]  CBC     Status: Abnormal   Collection Time: 01/18/20  1:15 AM  Result Value Ref Range   WBC 21.6 (H) 4.0 - 10.5 K/uL   RBC 5.15 (H) 3.87 - 5.11 MIL/uL   Hemoglobin 12.2 12.0 - 15.0 g/dL   HCT 16.0 36 - 46 %   MCV 75.0 (L) 80.0 - 100.0 fL   MCH 23.7 (L) 26.0 - 34.0 pg   MCHC 31.6 30.0 - 36.0 g/dL   RDW 10.9 32.3 - 55.7 %   Platelets 228 150 - 400 K/uL   nRBC 0.0 0.0 - 0.2 %    Comment: Performed at [facility], [address], [place], [place] [address]  hCG, quantitative, pregnancy     Status: None   Collection Time: 01/18/20  1:15 AM  Result Value Ref Range   hCG, Beta Chain, Quant,  S <1 <5 mIU/mL    Comment:          GEST. AGE      CONC.  (mIU/mL)   <=1 WEEK        5 - 50     2 WEEKS       50 - 500     3 WEEKS       100 - 10,000     4 WEEKS     1,000 - 30,000     5 WEEKS     3,500 - 115,000   6-8 WEEKS     12,000 - 270,000    12 WEEKS     15,000 - 220,000        FEMALE AND NON-PREGNANT FEMALE:     LESS THAN 5 mIU/mL Performed at [facility], [address], [place], [place] [address]   Respiratory Panel by RT PCR (Flu A&B, Covid) - Nasopharyngeal Swab     Status: None   Collection Time: 01/18/20  5:51 AM   Specimen: Nasopharyngeal Swab  Result Value Ref Range   SARS Coronavirus 2 by RT PCR NEGATIVE NEGATIVE    Comment: (NOTE) SARS-CoV-2 target nucleic acids are NOT DETECTED.  The SARS-CoV-2 RNA is generally detectable in upper respiratoy specimens during the acute phase of infection. The lowest concentration of SARS-CoV-2 viral copies this assay can detect is 131 copies/mL. A negative result does not preclude SARS-Cov-2 infection and should not be used as the sole basis for treatment or other patient  management decisions. A negative result may occur with  improper specimen collection/handling, submission of specimen other than nasopharyngeal swab, presence of viral mutation(s) within the areas targeted by this assay, and inadequate number of viral copies (<131 copies/mL). A negative result must be combined with clinical observations, patient history, and epidemiological information. The expected result is Negative.  Fact Sheet for Patients:  https://www.moore.com/  Fact Sheet for Healthcare Providers:  https://www.young.biz/  This test is no t yet approved or cleared by the [place] FDA and  has been authorized for detection and/or diagnosis of SARS-CoV-2 by FDA under an Emergency Use Authorization (EUA). This EUA will remain  in effect (meaning this test can be used) for the duration of the COVID-19 declaration under Section 564(b)(1) of the Act, 21 U.S.C. section 360bbb-3(b)(1), unless the authorization is terminated or revoked sooner.     Influenza A by PCR NEGATIVE NEGATIVE   Influenza B by PCR NEGATIVE NEGATIVE    Comment: (NOTE) The Xpert Xpress SARS-CoV-2/FLU/RSV assay is intended as an aid in  the diagnosis of influenza from Nasopharyngeal swab specimens and  should not be used as a sole basis for treatment. Nasal washings and  aspirates are unacceptable for Xpert Xpress SARS-CoV-2/FLU/RSV  testing.  Fact Sheet for Patients: https://www.moore.com/  Fact Sheet for Healthcare Providers: https://www.young.biz/  This test is not yet approved or cleared by the [place] FDA and  has been authorized for detection and/or diagnosis of SARS-CoV-2 by  FDA under an Emergency Use Authorization (EUA). This EUA will remain  in effect (meaning this test can be used) for the duration of the  Covid-19 declaration under Section 564(b)(1) of the Act, 21  U.S.C. section 360bbb-3(b)(1), unless the  authorization is  terminated or revoked. Performed at [facility], [address] [place], [place] [address]    CT Abdomen Pelvis W Contrast  Result Date: 01/18/2020 CLINICAL DATA:  Nausea vomiting and abdominal pain EXAM: CT ABDOMEN AND PELVIS WITH  CONTRAST TECHNIQUE: Multidetector CT imaging of the abdomen and pelvis was performed using the standard protocol following bolus administration of intravenous contrast. CONTRAST:  OMNIPAQUE IOHEXOL 300 MG/ML  SOLN COMPARISON:  100 cc of Isovue 300 FINDINGS: Lower chest: No acute abnormality. Hepatobiliary: There are scattered, subcentimeter low-attenuation foci within the liver. Similar to previous exam. These remain too small to reliably characterize. No suspicious liver lesion noted. Gallbladder negative. Pancreas: Unremarkable. No pancreatic ductal dilatation or surrounding inflammatory changes. Spleen: Normal in size without focal abnormality. Adrenals/Urinary Tract: Normal appearance of the adrenal glands. The kidneys are unremarkable. No mass or hydronephrosis identified. Urinary bladder is unremarkable. Stomach/Bowel: Stomach is normal. No dilated loops of small bowel. The appendix is visualized and appears thickened with surrounding inflammation compatible with acute appendicitis, image 52/5. No signs of appendiceal perforation or abscess. Unremarkable appearance of the colon. Vascular/Lymphatic: No significant vascular findings are present. No enlarged abdominal or pelvic lymph nodes. Reproductive: Uterus and bilateral adnexa are unremarkable. Other: No free fluid or fluid collection identified. Musculoskeletal: No acute or significant osseous findings. IMPRESSION: 1. Exam positive for acute appendicitis. No signs of perforation or abscess. Electronically Signed   By: Signa Kell M.D.   On: 01/18/2020 05:13    Review of Systems  Constitutional: Positive for fatigue.  HENT: Negative.   Eyes: Negative.   Respiratory: Negative.    Cardiovascular: Negative.   Gastrointestinal: Positive for abdominal pain.  Endocrine: Negative.   Genitourinary: Negative.   Musculoskeletal: Negative.   Allergic/Immunologic: Negative.   Neurological: Negative.   Hematological: Negative.   Psychiatric/Behavioral: Negative.     Blood pressure 113/66, pulse 74, temperature 98.8 F (37.1 C), temperature source Oral, resp. rate 18, height 5\' 4"  (1.626 m), weight 129.2 kg, last menstrual period 01/01/2020, SpO2 95 %, unknown if currently breastfeeding. Physical Exam Vitals reviewed.  Constitutional:      Appearance: She is well-developed. She is obese. She is not ill-appearing.  HENT:     Head: Normocephalic and atraumatic.  Cardiovascular:     Rate and Rhythm: Normal rate and regular rhythm.     Heart sounds: Normal heart sounds. No murmur heard.  No friction rub. No gallop.   Pulmonary:     Effort: Pulmonary effort is normal. No respiratory distress.     Breath sounds: Normal breath sounds. No stridor. No wheezing, rhonchi or rales.  Abdominal:     General: Abdomen is protuberant.     Palpations: Abdomen is soft.     Tenderness: There is abdominal tenderness in the right lower quadrant. There is no rebound. Positive signs include McBurney's sign.     Comments: No rigidity noted.  Skin:    General: Skin is warm and dry.  Neurological:     General: No focal deficit present.     Mental Status: She is alert.    Ct images personally reviewed.  Assessment/Plan Impression: Acute appendicitis Plan: Patient will be brought to the operating room today for laparoscopic appendectomy.  The risks and benefits of the procedure including bleeding, infection, and the possibility of an open procedure were fully explained to the patient, who gave informed consent.  01/03/2020, MD 01/18/2020, 7:39 AM

## 2020-01-18 NOTE — Transfer of Care (Signed)
Immediate Anesthesia Transfer of Care Note  Patient: Lindsay James  Procedure(s) Performed: APPENDECTOMY LAPAROSCOPIC (N/A Abdomen)  Patient Location: PACU  Anesthesia Type:General  Level of Consciousness: awake, alert  and oriented  Airway & Oxygen Therapy: Patient Spontanous Breathing and Patient connected to nasal cannula oxygen  Post-op Assessment: Report given to RN and Post -op Vital signs reviewed and stable  Post vital signs: Reviewed and stable  Last Vitals:  Vitals Value Taken Time  BP 116/79 01/18/20 0915  Temp    Pulse 93 01/18/20 0918  Resp 15 01/18/20 0918  SpO2 100 % 01/18/20 0918  Vitals shown include unvalidated device data.  Last Pain:  Vitals:   01/18/20 0738  TempSrc: Oral  PainSc:          Complications: No complications documented.

## 2020-01-18 NOTE — Interval H&P Note (Signed)
History and Physical Interval Note:  01/18/2020 7:45 AM  Lindsay James  has presented today for surgery, with the diagnosis of Acute Appendicitis.  The various methods of treatment have been discussed with the patient and family. After consideration of risks, benefits and other options for treatment, the patient has consented to  Procedure(s): APPENDECTOMY LAPAROSCOPIC (N/A) as a surgical intervention.  The patient's history has been reviewed, patient examined, no change in status, stable for surgery.  I have reviewed the patient's chart and labs.  Questions were answered to the patient's satisfaction.     Franky Macho

## 2020-01-18 NOTE — ED Provider Notes (Signed)
[facility] EMERGENCY DEPARTMENT Provider Note   CSN: [id number] Arrival date & time: 01/17/20  1851   Time seen 1:09 AM  History Chief Complaint  Patient presents with  . Abdominal Pain    Lindsay James is a 29 y.o. female.  HPI   Patient states about 3:30 PM today she was at work at [employer] and she started feeling weak and then she went into the bathroom and had 1 prolonged episode of vomiting.  She states she started having diffuse abdominal cramping.  She states she had another episode of vomiting while in the waiting room.  She did not have any diarrhea today but had diarrhea the day before.  She denies any fever.  She states she has a chronic cough because she smokes.  She denies rhinorrhea, sore throat, shortness of breath, loss of sense of taste or smell.  She states she started having pain in the central part of her chest while in the waiting room after vomiting.  She states it it is an aching and burning discomfort.  She also states her vomitus was burning and acid like.  She states she is still nauseated.  Her tongue feels dry but she is having normal urination.  She denies being around anybody that she knows is sick or any change in her diet.  Patient states she was started on new medication on Friday, October 29 to help her periods get more regular.  She was started on Provera  Past Medical History:  Diagnosis Date  . Asthma   . Chlamydia   . Diabetes mellitus without complication (HCC)   . Gonorrhea   . High cholesterol   . Migraine     Patient Active Problem List   Diagnosis Date Noted  . [redacted] weeks gestation of pregnancy   . Cerebral ventriculomegaly of fetus   . Cervical insufficiency during pregnancy in second trimester, antepartum   . [redacted] weeks gestation of pregnancy     History reviewed. No pertinent surgical history.   OB History    Gravida  1   Para      Term      Preterm      AB      Living  1     SAB      TAB      Ectopic        Multiple      Live Births  1           Family History  Problem Relation Age of Onset  . Diabetes Maternal Grandmother   . Arthritis Mother   . Cancer Father     Social History   Tobacco Use  . Smoking status: Current Some Day Smoker    Packs/day: 0.50    Types: Cigarettes  . Smokeless tobacco: Never Used  Vaping Use  . Vaping Use: Never used  Substance Use Topics  . Alcohol use: Yes    Comment: socially  . Drug use: Yes    Types: Marijuana  employed Smokes 1 black and mild cigar every 2 days  Home Medications Prior to Admission medications   Medication Sig Start Date End Date Taking? Authorizing Provider  acetaminophen (TYLENOL) 325 MG tablet Take 650 mg by mouth every 6 (six) hours as needed.    [provider]  Biotin w/ Vitamins C & E (HAIR/SKIN/NAILS PO) Take 1 tablet by mouth daily.    [provider]  dicyclomine (BENTYL) 20 MG tablet Take 1  tablet (20 mg total) by mouth 2 (two) times daily. Patient not taking: Reported on 01/08/2020 07/20/19   Maxwell Caul, PA-C  medroxyPROGESTERone (PROVERA) 10 MG tablet Take 1 tablet (10 mg total) by mouth daily. For 10 days if more than 6 weeks since last period 01/08/20   Adam Phenix, MD  metroNIDAZOLE (FLAGYL) 500 MG tablet Take 1 tablet (500 mg total) by mouth 2 (two) times daily for 7 days. 01/13/20 01/20/20  Adam Phenix, MD  ondansetron (ZOFRAN) 4 MG tablet Take 1 tablet (4 mg total) by mouth every 6 (six) hours. Patient not taking: Reported on 01/08/2020 10/28/19   Wurst, [place], PA-C  promethazine (PHENERGAN) 25 MG tablet Take 1 tablet (25 mg total) by mouth every 6 (six) hours as needed for nausea or vomiting. 07/22/19 10/28/19  Gilda Crease, MD    Allergies    Peanut-containing drug products  Review of Systems   Review of Systems  All other systems reviewed and are negative.   Physical Exam Updated Vital Signs BP 123/79 (BP Location: Left Arm)   Pulse 88   Temp 99.4  F (37.4 C) (Oral)   Resp 17   Ht 5\' 4"  (1.626 m)   Wt 129.2 kg   LMP 01/01/2020 (Approximate)   SpO2 100%   BMI 48.89 kg/m   Physical Exam Vitals and nursing note reviewed.  Constitutional:      General: She is not in acute distress.    Appearance: Normal appearance. She is obese.  HENT:     Head: Normocephalic and atraumatic.     Right Ear: External ear normal.     Left Ear: External ear normal.  Eyes:     Extraocular Movements: Extraocular movements intact.     Conjunctiva/sclera: Conjunctivae normal.     Pupils: Pupils are equal, round, and reactive to light.  Cardiovascular:     Rate and Rhythm: Normal rate and regular rhythm.     Pulses: Normal pulses.     Heart sounds: Normal heart sounds.  Pulmonary:     Effort: Pulmonary effort is normal. No respiratory distress.     Breath sounds: Normal breath sounds.  Abdominal:     General: Bowel sounds are normal.     Palpations: Abdomen is soft.     Tenderness: There is generalized abdominal tenderness. There is no guarding or rebound.  Musculoskeletal:        General: Normal range of motion.     Cervical back: Normal range of motion.  Skin:    General: Skin is warm and dry.  Neurological:     General: No focal deficit present.     Mental Status: She is alert and oriented to person, place, and time.     Cranial Nerves: No cranial nerve deficit.  Psychiatric:        Mood and Affect: Mood normal.        Behavior: Behavior normal.        Thought Content: Thought content normal.     ED Results / Procedures / Treatments   Labs (all labs ordered are listed, but only abnormal results are displayed) Results for orders placed or performed during the hospital encounter of 01/17/20  Lipase, blood  Result Value Ref Range   Lipase 18 11 - 51 U/L  Comprehensive metabolic panel  Result Value Ref Range   Sodium 134 (L) 135 - 145 mmol/L   Potassium 3.5 3.5 - 5.1 mmol/L   Chloride 104 98 - 111  mmol/L   CO2 23 22 - 32 mmol/L    Glucose, Bld 85 70 - 99 mg/dL   BUN 14 6 - 20 mg/dL   Creatinine, Ser 1.610.94 0.44 - 1.00 mg/dL   Calcium 8.7 (L) 8.9 - 10.3 mg/dL   Total Protein 6.8 6.5 - 8.1 g/dL   Albumin 3.4 (L) 3.5 - 5.0 g/dL   AST 13 (L) 15 - 41 U/L   ALT 13 0 - 44 U/L   Alkaline Phosphatase 62 38 - 126 U/L   Total Bilirubin 0.8 0.3 - 1.2 mg/dL   GFR, Estimated >09>60 >60>60 mL/min   Anion gap 7 5 - 15  CBC  Result Value Ref Range   WBC 21.6 (H) 4.0 - 10.5 K/uL   RBC 5.15 (H) 3.87 - 5.11 MIL/uL   Hemoglobin 12.2 12.0 - 15.0 g/dL   HCT 45.438.6 36 - 46 %   MCV 75.0 (L) 80.0 - 100.0 fL   MCH 23.7 (L) 26.0 - 34.0 pg   MCHC 31.6 30.0 - 36.0 g/dL   RDW 09.814.5 11.911.5 - 14.715.5 %   Platelets 228 150 - 400 K/uL   nRBC 0.0 0.0 - 0.2 %  hCG, quantitative, pregnancy  Result Value Ref Range   hCG, Beta Chain, Quant, S <1 <5 mIU/mL   Laboratory interpretation all normal except leukocytosis    EKG None  Radiology CT Abdomen Pelvis W Contrast  Result Date: 01/18/2020 CLINICAL DATA:  Nausea vomiting and abdominal pain EXAM: CT ABDOMEN AND PELVIS WITH CONTRAST TECHNIQUE: Multidetector CT imaging of the abdomen and pelvis was performed using the standard protocol following bolus administration of intravenous contrast. CONTRAST:  100mL OMNIPAQUE IOHEXOL 300 MG/ML  SOLN COMPARISON:  100 cc of Isovue 300 FINDINGS: Lower chest: No acute abnormality. Hepatobiliary: There are scattered, subcentimeter low-attenuation foci within the liver. Similar to previous exam. These remain too small to reliably characterize. No suspicious liver lesion noted. Gallbladder negative. Pancreas: Unremarkable. No pancreatic ductal dilatation or surrounding inflammatory changes. Spleen: Normal in size without focal abnormality. Adrenals/Urinary Tract: Normal appearance of the adrenal glands. The kidneys are unremarkable. No mass or hydronephrosis identified. Urinary bladder is unremarkable. Stomach/Bowel: Stomach is normal. No dilated loops of small bowel. The  appendix is visualized and appears thickened with surrounding inflammation compatible with acute appendicitis, image 52/5. No signs of appendiceal perforation or abscess. Unremarkable appearance of the colon. Vascular/Lymphatic: No significant vascular findings are present. No enlarged abdominal or pelvic lymph nodes. Reproductive: Uterus and bilateral adnexa are unremarkable. Other: No free fluid or fluid collection identified. Musculoskeletal: No acute or significant osseous findings. IMPRESSION: 1. Exam positive for acute appendicitis. No signs of perforation or abscess. Electronically Signed   By: Signa Kellaylor  Stroud M.D.   On: 01/18/2020 05:13    Procedures .Critical Care Performed by: Devoria AlbeKnapp, Antinio Sanderfer, MD Authorized by: Devoria AlbeKnapp, Lonnie Reth, MD   Critical care provider statement:    Critical care time (minutes):  31   Critical care was necessary to treat or prevent imminent or life-threatening deterioration of the following conditions: abdominal compromise.   Critical care was time spent personally by me on the following activities:  Discussions with consultants, examination of patient, obtaining history from patient or surrogate, ordering and review of laboratory studies, ordering and review of radiographic studies and re-evaluation of patient's condition   (including critical care time)  Medications Ordered in ED Medications  cefTRIAXone (ROCEPHIN) 2 g in sodium chloride 0.9 % 100 mL IVPB (has no administration in time range)  And  metroNIDAZOLE (FLAGYL) IVPB 500 mg (has no administration in time range)  sodium chloride 0.9 % bolus 1,000 mL (1,000 mLs Intravenous New Bag/Given (Non-Interop) 01/18/20 0210)  ondansetron (ZOFRAN) injection 4 mg (4 mg Intravenous Given 01/18/20 0210)  famotidine (PEPCID) IVPB 20 mg premix (20 mg Intravenous New Bag/Given (Non-Interop) 01/18/20 0507)  iohexol (OMNIPAQUE) 300 MG/ML solution 100 mL (100 mLs Intravenous Contrast Given 01/18/20 0426)  dicyclomine (BENTYL) injection  20 mg (20 mg Intramuscular Given 01/18/20 0507)    ED Course  I have reviewed the triage vital signs and the nursing notes.  Pertinent labs & imaging results that were available during my care of the patient were reviewed by me and considered in my medical decision making (see chart for details).    MDM Rules/Calculators/A&P                         Patient was given IV fluids and IV Zofran for nausea.   Recheck at 4:00 AM patient states she still having abdominal cramping but denies any nausea.  She states in May she was having some abdominal pain and nausea and vomiting that she thought was from alcohol poisoning.  However when I look at her chart she did have a CT scan that showed colitis.  She was treated as an outpatient with Flagyl and Cipro.  She states her pain today is not as bad as it was in May.  She is agreeable to doing a CT tonight.  After reviewing patient CT she was started on appendicitis appropriate antibiotics.  I rechecked the patient at 5:30 AM.  She is sleeping soundly and hard to wake up.  She states although her pain was generalized when I first saw her it is now starting to be more painful in the right lower quadrant.  She was given her diagnosis and is agreeable to have the surgeon evaluate her.  5:42 AM Dr. Lovell Sheehan, surgery will see patient in the ED.  States to keep patient n.p.o.  Final Clinical Impression(s) / ED Diagnoses Final diagnoses:  Acute appendicitis, unspecified acute appendicitis type    Rx / DC Orders Plan admission  Devoria Albe, MD, Concha Pyo, MD 01/18/20 [id number]

## 2020-01-18 NOTE — Anesthesia Procedure Notes (Signed)
Procedure Name: Intubation Date/Time: 01/18/2020 8:16 AM Performed by: Karna Dupes, CRNA Pre-anesthesia Checklist: Patient identified, Emergency Drugs available, Suction available and Patient being monitored Patient Re-evaluated:Patient Re-evaluated prior to induction Oxygen Delivery Method: Circle system utilized Preoxygenation: Pre-oxygenation with 100% oxygen Induction Type: IV induction Ventilation: Mask ventilation without difficulty Laryngoscope Size: Mac and 3 Tube type: Oral Tube size: 7.0 mm Number of attempts: 1 Airway Equipment and Method: Stylet Placement Confirmation: ETT inserted through vocal cords under direct vision,  positive ETCO2 and breath sounds checked- equal and bilateral Secured at: 21 cm Tube secured with: Tape Dental Injury: Teeth and Oropharynx as per pre-operative assessment

## 2020-01-18 NOTE — Anesthesia Postprocedure Evaluation (Signed)
Anesthesia Post Note  Patient: Lindsay James  Procedure(s) Performed: APPENDECTOMY LAPAROSCOPIC (N/A Abdomen)  Patient location during evaluation: Phase II Anesthesia Type: General Level of consciousness: awake Pain management: pain level controlled Vital Signs Assessment: post-procedure vital signs reviewed and stable Respiratory status: spontaneous breathing Cardiovascular status: blood pressure returned to baseline Anesthetic complications: no   No complications documented.   Last Vitals:  Vitals:   01/18/20 1015 01/18/20 1029  BP: 108/67 95/69  Pulse: (!) 54 63  Resp: 20 14  Temp:  36.4 C  SpO2: 95% 98%    Last Pain:  Vitals:   01/18/20 1029  TempSrc: Oral  PainSc: 6                  Windell Norfolk

## 2020-01-18 NOTE — Op Note (Signed)
Patient:  Lindsay James  DOB:  May 05, 1990  MRN:  [id number]   Preop Diagnosis: Acute appendicitis  Postop Diagnosis: Same  Procedure: Laparoscopic appendectomy  Surgeon: Franky Macho, MD  Anes: General endotracheal  Indications: Patient is a 29 year old black female who presents with a less than 24-hour history of worsening right lower quadrant abdominal pain.  CT scan of the abdomen reveals acute appendicitis.  The risks and benefits of the procedure including bleeding, infection, and the possibility of an open procedure were fully explained to the patient, who gave informed consent.  Procedure note: The patient was placed in the supine position.  After induction of general endotracheal anesthesia, the abdomen was prepped and draped using the usual sterile technique with ChloraPrep.  Surgical site confirmation was performed.  A supraumbilical incision was made down to the fascia.  A Veress needle was introduced into the abdominal cavity and confirmation of placement was done using the saline drop test.  The abdomen was then insufflated to 15 mmHg pressure.  An 11 mm trocar was introduced into the abdominal cavity under direct visualization without difficulty.  The patient was placed in deeper Trendelenburg position and an additional 12 mm trocar was placed in the suprapubic region and a 5 mm trocar was placed in the left lower quadrant region.  The appendix was visualized and noted to be acutely inflamed.  The mesoappendix was divided using the harmonic scalpel.  The juncture of the appendix to the cecum was fully visualized.  A standard Endo GIA was placed across the base the appendix and fired.  The appendix was removed using an Endo Catch bag without difficulty.  The staple line was inspected and noted to be within normal limits.  All fluid and air were then evacuated from the abdominal cavity prior to removal of the trochars.  All wounds were irrigated with normal saline.  All wounds  were injected with Exparel.  The supraumbilical fascia was reapproximated using an 0 Vicryl interrupted suture.  All skin incisions were closed using a 4-0 Monocryl subcuticular suture.  Dermabond was applied.  All tape and needle counts were correct at the end of the procedure.  The patient was extubated in the operating room and transferred to PACU in stable condition.  Complications: None  EBL: Minimal  Specimen: Appendix

## 2020-01-18 NOTE — ED Notes (Signed)
Pt sitting in bed texting on cell phone

## 2020-01-18 NOTE — Discharge Instructions (Signed)
[facility] [place] [facility] UNTIL Friday November 5,2021. DO NO USE ANY ADDITIONAL NUMBING MEDICATIONS UNTIL AFTER Friday WITHOUT CONSULTING A PHYSICIAN      Laparoscopic Appendectomy, Adult, Care After This sheet gives you information about how to care for yourself after your procedure. Your doctor may also give you more specific instructions. If you have problems or questions, contact your doctor. What can I expect after the procedure? After the procedure, it is common to have:  Little energy for normal activities.  Mild pain in the area where the cuts from surgery (incisions) were made.  Trouble pooping (constipation). This can be caused by: ? Pain medicine. ? A lack of activity. Follow these instructions at home: Medicines  Take over-the-counter and prescription medicines only as told by your doctor.  If you were prescribed an antibiotic medicine, take it as told by your doctor. Do not stop taking it even if you start to feel better.  Do not drive or use heavy machinery while taking prescription pain medicine.  Ask your doctor if the medicine you are taking can cause trouble pooping. You may need to take steps to prevent or treat trouble pooping: ? Drink enough fluid to keep your pee (urine) pale yellow. ? Take over-the-counter or prescription medicines. ? Eat foods that are high in fiber. These include beans, whole grains, and fresh fruits and vegetables. ? Limit foods that are high in fat and sugar. These include fried or sweet foods. Incision care   Follow instructions from your doctor about how to take care of your cuts from surgery. Make sure you: ? Wash your hands with soap and water before and after you change your bandage (dressing). If you cannot use soap and water, use hand sanitizer. ? Change your bandage as told by your doctor. ? Leave stitches (sutures), skin glue, or skin tape (adhesive) strips in place. They may need to stay in place for 2 weeks or  longer. If tape strips get loose and curl up, you may trim the loose edges. Do not remove tape strips completely unless your doctor says it is okay.  Check your cuts from surgery every day for signs of infection. Check for: ? Redness, swelling, or pain. ? Fluid or blood. ? Warmth. ? Pus or a bad smell. Bathing  Keep your cuts from surgery clean and dry. Clean them as told by your doctor. To do this: 1. Gently wash the cuts with soap and water. 2. Rinse the cuts with water to remove all soap. 3. Pat the cuts dry with a clean towel. Do not rub the cuts.  Do not take baths, swim, or use a hot tub for 2 weeks, or until your doctor says it is okay. You may take showers after 48 hours. Activity   Do not drive for 24 hours if you were given a medicine to help you relax (sedative) during your procedure.  Rest after the procedure. Return to your normal activities as told by your doctor. Ask your doctor what activities are safe for you.  For 3 weeks, or for as long as told by your doctor: ? Do not lift anything that is heavier than 10 lb (4.5 kg), or the limit that you are told. ? Do not play contact sports. General instructions  If you were sent home with a drain, follow instructions from your doctor on how to care for it.  Take deep breaths. This helps to keep your lungs from getting an infection (pneumonia).  Keep all follow-up visits as told by your doctor. This is important. Contact a doctor if:  You have redness, swelling, or pain around a cut from surgery.  You have fluid or blood coming from a cut.  Your cut feels warm to the touch.  You have pus or a bad smell coming from a cut or a bandage.  The edges of a cut break open after the stitches have been taken out.  You have pain in your shoulders that gets worse.  You feel dizzy or you pass out (faint).  You have shortness of breath.  You keep feeling sick to your stomach (nauseous).  You keep throwing up  (vomiting).  You get watery poop (diarrhea) or you cannot control your poop.  You lose your appetite.  You have swelling or pain in your legs.  You get a rash. Get help right away if:  You have a fever.  You have trouble breathing.  You have sharp pains in your chest. Summary  After the procedure, it is common to have low energy, mild pain, and trouble pooping.  Infection is a common problem after this procedure. Follow your doctor's instructions about caring for yourself after the procedure.  Rest after the procedure. Return to your normal activities as told by your doctor.  Contact your doctor if you see signs of infection around your cuts from surgery, or you get short of breath. Get help right away if you have a fever, chest pain, or trouble breathing. This information is not intended to replace advice given to you by your health care provider. Make sure you discuss any questions you have with your health care provider. Document Revised: 09/05/2017 Document Reviewed: 09/05/2017 Elsevier Patient Education  2020 [employer]      General Anesthesia, Adult, Care After This sheet gives you information about how to care for yourself after your procedure. Your health care provider may also give you more specific instructions. If you have problems or questions, contact your health care provider. What can I expect after the procedure? After the procedure, the following side effects are common:  Pain or discomfort at the IV site.  Nausea.  Vomiting.  Sore throat.  Trouble concentrating.  Feeling cold or chills.  Weak or tired.  Sleepiness and fatigue.  Soreness and body aches. These side effects can affect parts of the body that were not involved in surgery. Follow these instructions at home:  For at least 24 hours after the procedure:  Have a responsible adult stay with you. It is important to have someone help care for you until you are awake and  alert.  Rest as needed.  Do not: ? Participate in activities in which you could fall or become injured. ? Drive. ? Use heavy machinery. ? Drink alcohol. ? Take sleeping pills or medicines that cause drowsiness. ? Make important decisions or sign legal documents. ? Take care of children on your own. Eating and drinking  Follow any instructions from your health care provider about eating or drinking restrictions.  When you feel hungry, start by eating small amounts of foods that are soft and easy to digest (bland), such as toast. Gradually return to your regular diet.  Drink enough fluid to keep your urine pale yellow.  If you vomit, rehydrate by drinking water, juice, or clear broth. General instructions  If you have sleep apnea, surgery and certain medicines can increase your risk for breathing problems. Follow instructions from your health care provider about  wearing your sleep device: ? Anytime you are sleeping, including during daytime naps. ? While taking prescription pain medicines, sleeping medicines, or medicines that make you drowsy.  Return to your normal activities as told by your health care provider. Ask your health care provider what activities are safe for you.  Take over-the-counter and prescription medicines only as told by your health care provider.  If you smoke, do not smoke without supervision.  Keep all follow-up visits as told by your health care provider. This is important. Contact a health care provider if:  You have nausea or vomiting that does not get better with medicine.  You cannot eat or drink without vomiting.  You have pain that does not get better with medicine.  You are unable to pass urine.  You develop a skin rash.  You have a fever.  You have redness around your IV site that gets worse. Get help right away if:  You have difficulty breathing.  You have chest pain.  You have blood in your urine or stool, or you vomit  blood. Summary  After the procedure, it is common to have a sore throat or nausea. It is also common to feel tired.  Have a responsible adult stay with you for the first 24 hours after general anesthesia. It is important to have someone help care for you until you are awake and alert.  When you feel hungry, start by eating small amounts of foods that are soft and easy to digest (bland), such as toast. Gradually return to your regular diet.  Drink enough fluid to keep your urine pale yellow.  Return to your normal activities as told by your health care provider. Ask your health care provider what activities are safe for you. This information is not intended to replace advice given to you by your health care provider. Make sure you discuss any questions you have with your health care provider. Document Revised: 03/08/2017 Document Reviewed: 10/19/2016 Elsevier Patient Education  2020 [employer].

## 2020-01-19 ENCOUNTER — Encounter (HOSPITAL_COMMUNITY): Payer: Self-pay | Admitting: General Surgery

## 2020-01-19 LAB — SURGICAL PATHOLOGY

## 2020-01-25 NOTE — BH Specialist Note (Signed)
Integrated Behavioral Health via Telemedicine Video (Caregility) Visit  01/25/2020 [name] [id number]  Number of Integrated Behavioral Health visits: 1 Session Start time: 9:17  Session End time: 9:52 Total time: 35 minutes  Referring Provider: Scheryl Darter, MD Type of Service: Individual Patient/Family location: Home [facility] Provider location: Center for [facility]  All persons participating in visit: Patient Lindsay James and [facility] Lindsay James [place]     I connected with [name]  by a video enabled telemedicine application (Caregility) and verified that I am speaking with the correct person using two identifiers.   Discussed confidentiality: Yes   Confirmed demographics & insurance:  Yes   I discussed that engaging in this virtual visit, they consent to the provision of behavioral healthcare and the services will be billed under their insurance.   Patient and/or legal guardian expressed understanding and consented to virtual visit: Yes   PRESENTING CONCERNS: Patient and/or family reports the following symptoms/concerns: Pt states her primary symptoms are poor appetite (favorite foods don't taste the same), lack of quality sleep and anhedonia, since just prior to surgery until now; pt worries about going back to work too soon before fully recovered, due to having to lift items at work, along with financial stress uninsured.  Duration of problem: Prior to surgery, when pain began; Severity of problem: moderate  STRENGTHS (Protective Factors/Coping Skills): Supportive mother, supportive workplace  ASSESSMENT: Patient currently experiencing Adjustment disorder with mixed anxiety and depressed mood.    GOALS ADDRESSED: Patient will: 1.  Reduce symptoms of: anxiety, depression and stress  2.  Increase knowledge and/or ability of: stress reduction  3.  Demonstrate ability to: Increase healthy adjustment to current life  circumstances   Progress of Goals: Ongoing  INTERVENTIONS: Interventions utilized:  Solution-Focused Strategies and Psychoeducation and/or Health Education Standardized Assessments completed & reviewed: GAD-7 and PHQ 9   OUTCOME: Patient Response: Pt agrees to treatment plan   PLAN: 1. Follow up with behavioral health clinician on : Two weeks via phone 2. Behavioral recommendations:  -Consider spending 20 minutes daily outdoors prior to resuming work  -Consider using apps (on AVS) for additional self-care during recovery post-surgery -Continue to allow family and workplace to be supportive during this time (accept help when offered) -[employer] paperwork will be sent to you; fill it out and turn in, according to instructions on paperwork 3. Referral(s): Integrated [employer] (In Clinic)  I discussed the assessment and treatment plan with the patient and/or parent/guardian. They were provided an opportunity to ask questions and all were answered. They agreed with the plan and demonstrated an understanding of the instructions.   They were advised to call back or seek an in-person evaluation as appropriate.  I discussed that the purpose of this visit is to provide behavioral health care while limiting exposure to the novel coronavirus.  Discussed there is a possibility of technology failure and discussed alternative modes of communication if that failure occurs.   Lindsay James Lindsay James  Depression screen [facility] 2/9 01/26/2020 01/08/2020 04/27/2014 03/30/2014  Decreased Interest 3 2 0 0  Down, Depressed, Hopeless 2 2 0 0  PHQ - 2 Score 5 4 0 0  Altered sleeping 3 2 - -  Tired, decreased energy 1 2 - -  Change in appetite 3 2 - -  Feeling bad or failure about yourself  2 2 - -  Trouble concentrating 0 2 - -  Moving slowly or fidgety/restless 0 2 - -  Suicidal thoughts 0 0 - -  PHQ-9 Score 14 16 - -   GAD 7 : Generalized Anxiety Score 01/26/2020 01/08/2020  Nervous,  Anxious, on Edge 1 2  Control/stop worrying 1 2  Worry too much - different things 1 2  Trouble relaxing 1 2  Restless 1 0  Easily annoyed or irritable 0 2  Afraid - awful might happen 1 2  Total GAD 7 Score 6 12

## 2020-01-26 ENCOUNTER — Ambulatory Visit (INDEPENDENT_AMBULATORY_CARE_PROVIDER_SITE_OTHER): Payer: Medicaid Other | Admitting: General Surgery

## 2020-01-26 ENCOUNTER — Other Ambulatory Visit: Payer: Self-pay

## 2020-01-26 ENCOUNTER — Ambulatory Visit (INDEPENDENT_AMBULATORY_CARE_PROVIDER_SITE_OTHER): Payer: Self-pay | Admitting: Clinical

## 2020-01-26 ENCOUNTER — Encounter: Payer: Self-pay | Admitting: Family Medicine

## 2020-01-26 ENCOUNTER — Encounter: Payer: Self-pay | Admitting: General Surgery

## 2020-01-26 ENCOUNTER — Other Ambulatory Visit: Payer: Self-pay | Admitting: Family Medicine

## 2020-01-26 VITALS — BP 99/61 | HR 80 | Temp 98.1°F | Resp 18 | Ht 64.0 in | Wt 283.0 lb

## 2020-01-26 DIAGNOSIS — Z09 Encounter for follow-up examination after completed treatment for conditions other than malignant neoplasm: Secondary | ICD-10-CM

## 2020-01-26 DIAGNOSIS — F4323 Adjustment disorder with mixed anxiety and depressed mood: Secondary | ICD-10-CM

## 2020-01-26 NOTE — Progress Notes (Signed)
Subjective:     Lindsay James  Patient is here for follow-up status post laparoscopic appendectomy.  She is doing well.  She denies any fever or chills. Objective:    BP 99/61   Pulse 80   Temp 98.1 F (36.7 C) (Oral)   Resp 18   Ht 5\' 4"  (1.626 m)   Wt 283 lb (128.4 kg)   LMP 01/01/2020 (Approximate)   SpO2 95%   BMI 48.58 kg/m   General:  alert, cooperative and no distress  Abdomen soft, incisions healing well. Final pathology consistent with diagnosis.     Assessment:    Doing well postoperatively.    Plan:   May return to work without restrictions on 01/29/2020.  Follow-up here as needed.

## 2020-01-26 NOTE — Patient Instructions (Signed)
[facility] [address] [place], [place] [address] [phone] (main office) [phone] (Sunya Humbarger's office)  /Emotional Wellbeing Apps and Websites Here are a few free apps meant to help you to help yourself.  To find, try searching on the internet to see if the app is offered on Apple/Android devices. If your first choice doesn't come up on your device, the good news is that there are many choices! Play around with different apps to see which ones are helpful to you.    Calm This is an app meant to help increase calm feelings. Includes info, strategies, and tools for tracking your feelings.      Calm Harm  This app is meant to help with self-harm. Provides many 5-minute or 15-min coping strategies for doing instead of hurting yourself.       Healthy Minds Health Minds is a problem-solving tool to help deal with emotions and cope with stress you encounter wherever you are.      MindShift This app can help people cope with anxiety. Rather than trying to avoid anxiety, you can make an important shift and face it.      MY3  MY3 features a support system, safety plan and resources with the goal of offering a tool to use in a time of need.       My Life My Voice  This mood journal offers a simple solution for tracking your thoughts, feelings and moods. Animated emoticons can help identify your mood.       Relax Melodies Designed to help with sleep, on this app you can mix sounds and meditations for relaxation.      Smiling Mind Smiling Mind is meditation made easy: it's a simple tool that helps put a smile on your mind.        Stop, Breathe & Think  A friendly, simple guide for people through meditations for mindfulness and compassion.  Stop, Breathe and Think Kids Enter your current feelings and choose a "mission" to help you cope. Offers videos for certain moods instead of just sound recordings.       Team  Orange The goal of this tool is to help teens change how they think, act, and react. This app helps you focus on your own good feelings and experiences.      The Virtual Hope Box The Virtual Hope Box (VHB) contains simple tools to help patients with coping, relaxation, distraction, and positive thinking.     

## 2020-02-02 NOTE — BH Specialist Note (Addendum)
Integrated Behavioral Health Follow Up In-Person Visit  MRN: [id number] Name: Lindsay James  Number of Integrated Behavioral Health Clinician visits: 2/6 Session Start time: 2:27  Session End time: 2:41 Total time: 14 minutes  Types of Service: General Behavioral Integrated Care (BHI)  Interpretor:No. Interpretor Name and Language: n/a  Subjective: Lindsay James is a 29 y.o. female accompanied by n/a Patient was referred by Scheryl Darter, MD for positive depression screen. Patient reports the following symptoms/concerns: Pt states her primary concern today is feeling unheard by employer after having "hands go numb" carrying heavy items at work too soon after surgery; pt also feeling stressed being uninsured and food insecurity.  Duration of problem: prior to surgery until present ; Severity of problem: moderate  Objective: Mood: Normal and Affect: Appropriate Risk of harm to self or others: No plan to harm self or others  Life Context: Family and Social: Pt's mother supportive School/Work: Working in [employer] Self-Care: Advocating for herself at work Life Changes: Recent surgery  Patient and/or Family's Strengths/Protective Factors: Social connections  Goals Addressed: Patient will: 1.  Reduce symptoms of: anxiety, depression and stress  2.  Increase knowledge and/or ability of: stress reduction  3.  Demonstrate ability to: Increase healthy adjustment to current life circumstances  Progress towards Goals: Ongoing  Interventions: Interventions utilized:  Solution-Focused Strategies Standardized Assessments completed: GAD-7 and PHQ 9  Patient and/or Family Response: Pt agrees to treatment plan  Patient Centered Plan: Patient is on the following Treatment Plan(s): IBH Assessment: Patient currently experiencing Psychosocial stress.   Patient may benefit from psychoeducation and brief therapeutic interventions regarding coping with life  stress .  Plan: 1. Follow up with behavioral health clinician on : As needed 2. Behavioral recommendations:  -Accept referral to [facility]; take home bag today -Continue with plan to complete [employer] paperwork -Consider establishing with PCP (list of providers on After Visit Summary) -Consider spending time outdoors daily and apps (on AVS first visit) for additional daily self-care 3. Referral(s): Integrated Art gallery manager (In Clinic) and [employer]:  Food 4. "From scale of 1-10, how likely are you to follow plan?": -  Rae Lips, LCSW   Depression screen [facility] 2/9 02/15/2020 01/26/2020 01/08/2020 04/27/2014 03/30/2014  Decreased Interest 0 3 2 0 0  Down, Depressed, Hopeless 0 2 2 0 0  PHQ - 2 Score 0 5 4 0 0  Altered sleeping 1 3 2  - -  Tired, decreased energy 1 1 2  - -  Change in appetite 1 3 2  - -  Feeling bad or failure about yourself  1 2 2  - -  Trouble concentrating 0 0 2 - -  Moving slowly or fidgety/restless 1 0 2 - -  Suicidal thoughts 0 0 0 - -  PHQ-9 Score 5 14 16  - -   GAD 7 : Generalized Anxiety Score 02/15/2020 01/26/2020 01/08/2020  Nervous, Anxious, on Edge 1 1 2   Control/stop worrying 1 1 2   Worry too much - different things 1 1 2   Trouble relaxing 1 1 2   Restless 0 1 0  Easily annoyed or irritable 0 0 2  Afraid - awful might happen 0 1 2  Total GAD 7 Score 4 6 12

## 2020-02-09 ENCOUNTER — Telehealth: Payer: Self-pay | Admitting: Clinical

## 2020-02-09 NOTE — Telephone Encounter (Signed)
Attempt to follow up by phone; Left HIPPA-compliant message to call back Asher Muir from [employer] for [employer] at [facility] at [phone] [facility] office).

## 2020-02-15 ENCOUNTER — Ambulatory Visit: Payer: Medicaid Other | Admitting: Clinical

## 2020-02-15 ENCOUNTER — Other Ambulatory Visit: Payer: Self-pay

## 2020-02-15 DIAGNOSIS — Z658 Other specified problems related to psychosocial circumstances: Secondary | ICD-10-CM

## 2020-02-15 NOTE — Patient Instructions (Signed)
[facility] primary care offices accepting new patients:   [facility] [place] [address] [place], [place] [address] [phone]  [facility] [place] [address] [place], [place] [address] [phone]  [facility] [address] [place], [place] [address] [phone]  [facility] [address] [place], [place] [address] [phone]  [facility] [address] [place],  [place]  [address] [phone]  

## 2020-02-22 ENCOUNTER — Telehealth: Payer: Self-pay | Admitting: Clinical

## 2020-02-22 NOTE — Telephone Encounter (Signed)
Attempt to f/u for brief phone check; phone did not go to voicemail, so unable to leave voice message.

## 2020-04-20 ENCOUNTER — Other Ambulatory Visit: Payer: Self-pay

## 2020-04-20 ENCOUNTER — Ambulatory Visit
Admission: EM | Admit: 2020-04-20 | Discharge: 2020-04-20 | Disposition: A | Discharge status: Home / Self Care | Payer: PRIVATE HEALTH INSURANCE | Attending: Emergency Medicine | Admitting: Emergency Medicine

## 2020-04-20 DIAGNOSIS — J069 Acute upper respiratory infection, unspecified: Secondary | ICD-10-CM

## 2020-04-20 DIAGNOSIS — R52 Pain, unspecified: Secondary | ICD-10-CM

## 2020-04-20 DIAGNOSIS — R519 Headache, unspecified: Secondary | ICD-10-CM

## 2020-04-20 DIAGNOSIS — Z1152 Encounter for screening for COVID-19: Secondary | ICD-10-CM | POA: Diagnosis not present

## 2020-04-20 MED ORDER — BENZONATATE 100 MG PO CAPS: 100.0000 mg | ORAL_CAPSULE | Freq: Three times a day (TID) | ORAL | 0 refills | Status: DC | PRN

## 2020-04-20 MED ORDER — DEXAMETHASONE 4 MG PO TABS: 4.0000 mg | ORAL_TABLET | Freq: Every day | ORAL | 0 refills | Status: AC

## 2020-04-20 NOTE — ED Provider Notes (Signed)
[facility]   [id number] 04/20/20 Arrival Time: 0957   CC: COVID symptoms  SUBJECTIVE: History from: patient.  Lindsay James is a 30 y.o. female who presented to the urgent care for complaint of headache, body aches and cough that started this morning.  Has  a child with the same complaint.  Denies recent travel.  Has tried OTC medication without relief.  Denies alleviating or aggravating factors.  Denies previous symptoms in the past.   Denies fever, chills, fatigue, sinus pain, rhinorrhea, sore throat, SOB, wheezing, chest pain, nausea, changes in bowel or bladder habits.     ROS: As per HPI.  All other pertinent ROS negative.      Past Medical History:  Diagnosis Date  . Asthma   . Chlamydia   . Diabetes mellitus without complication (HCC)   . Gonorrhea   . High cholesterol   . Migraine    Past Surgical History:  Procedure Laterality Date  . LAPAROSCOPIC APPENDECTOMY N/A 01/18/2020   Procedure: APPENDECTOMY LAPAROSCOPIC;  Surgeon: Franky Macho, MD;  Location: [facility];  Service: General;  Laterality: N/A;   Allergies  Allergen Reactions  . Peanut-Containing Drug Products Rash   No current facility-administered medications on file prior to encounter.   Current Outpatient Medications on File Prior to Encounter  Medication Sig Dispense Refill  . acetaminophen (TYLENOL) 325 MG tablet Take 650 mg by mouth every 6 (six) hours as needed.    . Biotin w/ Vitamins C & E (HAIR/SKIN/NAILS PO) Take 1 tablet by mouth daily.    Marland Kitchen HYDROcodone-acetaminophen (NORCO) 5-325 MG tablet Take 1-2 tablets by mouth every 4 (four) hours as needed for moderate pain. 40 tablet 0  . medroxyPROGESTERone (PROVERA) 10 MG tablet Take 1 tablet (10 mg total) by mouth daily. For 10 days if more than 6 weeks since last period 30 tablet 1  . [DISCONTINUED] promethazine (PHENERGAN) 25 MG tablet Take 1 tablet (25 mg total) by mouth every 6 (six) hours as needed for nausea or vomiting. 30 tablet 0    Social History   Socioeconomic History  . Marital status: Single    Spouse name: Not on file  . Number of children: Not on file  . Years of education: Not on file  . Highest education level: Not on file  Occupational History  . Not on file  Tobacco Use  . Smoking status: Current Some Day Smoker    Packs/day: 0.50    Types: Cigarettes  . Smokeless tobacco: Never Used  Vaping Use  . Vaping Use: Never used  Substance and Sexual Activity  . Alcohol use: Yes    Comment: socially  . Drug use: Yes    Types: Marijuana  . Sexual activity: Yes    Birth control/protection: Implant  Other Topics Concern  . Not on file  Social History Narrative  . Not on file   Social Determinants of Health   Financial Resource Strain: Not on file  Food Insecurity: Food Insecurity Present  . Worried About Programme researcher, broadcasting/film/video in the Last Year: Sometimes true  . Ran Out of Food in the Last Year: Sometimes true  Transportation Needs: No Transportation Needs  . Lack of Transportation (Medical): No  . Lack of Transportation (Non-Medical): No  Physical Activity: Not on file  Stress: Not on file  Social Connections: Not on file  Intimate Partner Violence: Not on file   Family History  Problem Relation Age of Onset  . Diabetes Maternal Grandmother   .  Arthritis Mother   . Cancer Father     OBJECTIVE:  Vitals:   04/20/20 1013  BP: 124/74  Pulse: 75  Resp: 18  Temp: 99.5 F (37.5 C)  SpO2: 98%     General appearance: alert; appears fatigued, but nontoxic; speaking in full sentences and tolerating own secretions HEENT: NCAT; Ears: EACs clear, TMs pearly gray; Eyes: PERRL.  EOM grossly intact. Sinuses: nontender; Nose: nares patent without rhinorrhea, Throat: oropharynx clear, tonsils non erythematous or enlarged, uvula midline  Neck: supple without LAD Lungs: unlabored respirations, symmetrical air entry; cough: Mild; no respiratory distress; CTAB Heart: regular rate and rhythm.  Radial  pulses 2+ symmetrical bilaterally Skin: warm and dry Psychological: alert and cooperative; normal mood and affect  LABS:  No results found for this or any previous visit (from the past 24 hour(s)).   ASSESSMENT & PLAN:  1. Encounter for screening for COVID-19   2. Acute nonintractable headache, unspecified headache type   3. Body aches   4. Viral URI with cough     Meds ordered this encounter  Medications  . dexamethasone (DECADRON) 4 MG tablet    Sig: Take 1 tablet (4 mg total) by mouth daily for 7 days.    Dispense:  7 tablet    Refill:  0  . benzonatate (TESSALON) 100 MG capsule    Sig: Take 1 capsule (100 mg total) by mouth 3 (three) times daily as needed for cough.    Dispense:  30 capsule    Refill:  0    Discharge instructions  COVID-19, flu A/B testing ordered.  It will take between 2-7 days for test results.  Someone will contact you regarding abnormal results.    Get plenty of rest and push fluids Tessalon Perles prescribed for cough Use OTC zyrtec for nasal congestion, runny nose, and/or sore throat Use OTC flonase for nasal congestion and runny nose Decadron was prescribed Use medications daily for symptom relief Use OTC medications like ibuprofen or tylenol as needed fever or pain Call or go to the ED if you have any new or worsening symptoms such as fever, worsening cough, shortness of breath, chest tightness, chest pain, turning blue, changes in mental status, etc...   Reviewed expectations re: course of current medical issues. Questions answered. Outlined signs and symptoms indicating need for more acute intervention. Patient verbalized understanding. After Visit Summary given.         Durward Parcel, FNP 04/20/20 1028

## 2020-04-20 NOTE — Discharge Instructions (Signed)
COVID-19, flu A/B testing ordered.  It will take between 2-7 days for test results.  Someone will contact you regarding abnormal results.    Get plenty of rest and push fluids Tessalon Perles prescribed for cough Use OTC zyrtec for nasal congestion, runny nose, and/or sore throat Use OTC flonase for nasal congestion and runny nose Decadron was prescribed Use medications daily for symptom relief Use OTC medications like ibuprofen or tylenol as needed fever or pain Call or go to the ED if you have any new or worsening symptoms such as fever, worsening cough, shortness of breath, chest tightness, chest pain, turning blue, changes in mental status, etc..Marland Kitchen

## 2020-04-20 NOTE — ED Triage Notes (Signed)
Pt presents with c/o head ache and body aches that began this morning

## 2020-04-22 LAB — COVID-19, FLU A+B NAA
Influenza A, NAA: NOT DETECTED
Influenza B, NAA: NOT DETECTED
SARS-CoV-2, NAA: DETECTED — AB

## 2020-05-05 NOTE — Congregational Nurse Program (Signed)
Pt attended [facility] to inquire about the Care Connect Program.    States she is currently having some dental issues, but also needs to establish care with a medical provider for other medical issues   Pt was provided with a Care Connect application and explained the guidelines to be eligible for enrollment.  Pt states she understood and would take an application to complete and home and then bring back supporting documents as soon as possible.    Pt was provided with [facility] dental clinic contact information, if she wanted to contact them immediately for her current dental issues  Pt stated she understood and left clinic

## 2020-05-09 ENCOUNTER — Telehealth: Payer: Self-pay

## 2020-05-09 NOTE — Telephone Encounter (Signed)
Attempted call to client to assist in arranging first appointment to establish medical care with [facility]. No answer, left voicemail requesting return call.   Francee Nodal RN [facility]

## 2020-05-09 NOTE — Telephone Encounter (Signed)
Client returned phone call. Was able to assist client in securing an appointment to establish primary care with [facility]. Appointment scheduled for 05/19/20 at 0830am. With client's permission all information texted to client regarding appointment date/time and location as well as [facility] contact information.  Will plan follow up after first visit. Client agreeable.  Francee Nodal RN [facility] [employer]

## 2020-05-19 ENCOUNTER — Encounter: Payer: Self-pay | Admitting: Physician Assistant

## 2020-05-19 ENCOUNTER — Other Ambulatory Visit (HOSPITAL_COMMUNITY)
Admission: RE | Admit: 2020-05-19 | Discharge: 2020-05-19 | Disposition: A | Discharge status: Home / Self Care | Payer: PRIVATE HEALTH INSURANCE | Source: Ambulatory Visit | Attending: Physician Assistant | Admitting: Physician Assistant

## 2020-05-19 ENCOUNTER — Ambulatory Visit: Payer: Medicaid Other | Admitting: Physician Assistant

## 2020-05-19 ENCOUNTER — Other Ambulatory Visit: Payer: Self-pay

## 2020-05-19 VITALS — BP 119/71 | HR 72 | Temp 98.1°F | Ht 64.25 in | Wt 288.0 lb

## 2020-05-19 DIAGNOSIS — Z131 Encounter for screening for diabetes mellitus: Secondary | ICD-10-CM

## 2020-05-19 DIAGNOSIS — Z8639 Personal history of other endocrine, nutritional and metabolic disease: Secondary | ICD-10-CM | POA: Diagnosis present

## 2020-05-19 DIAGNOSIS — F17200 Nicotine dependence, unspecified, uncomplicated: Secondary | ICD-10-CM

## 2020-05-19 DIAGNOSIS — Z7689 Persons encountering health services in other specified circumstances: Secondary | ICD-10-CM

## 2020-05-19 DIAGNOSIS — M79605 Pain in left leg: Secondary | ICD-10-CM

## 2020-05-19 DIAGNOSIS — Z13220 Encounter for screening for lipoid disorders: Secondary | ICD-10-CM

## 2020-05-19 DIAGNOSIS — R69 Illness, unspecified: Secondary | ICD-10-CM | POA: Diagnosis present

## 2020-05-19 DIAGNOSIS — Z6841 Body Mass Index (BMI) 40.0 and over, adult: Secondary | ICD-10-CM

## 2020-05-19 LAB — LIPID PANEL
Cholesterol: 196 mg/dL (ref 0–200)
HDL: 43 mg/dL (ref >40)
LDL Cholesterol: 140 mg/dL — ABNORMAL HIGH (ref 0–99)
Total CHOL/HDL Ratio: 4.6 RATIO
Triglycerides: 63 mg/dL (ref <150)
VLDL: 13 mg/dL (ref 0–40)

## 2020-05-19 LAB — COMPREHENSIVE METABOLIC PANEL
ALT: 16 U/L (ref 0–44)
AST: 20 U/L (ref 15–41)
Albumin: 3.4 g/dL — ABNORMAL LOW (ref 3.5–5.0)
Alkaline Phosphatase: 67 U/L (ref 38–126)
Anion gap: 8 (ref 5–15)
BUN: 14 mg/dL (ref 6–20)
CO2: 26 mmol/L (ref 22–32)
Calcium: 8.7 mg/dL — ABNORMAL LOW (ref 8.9–10.3)
Chloride: 101 mmol/L (ref 98–111)
Creatinine, Ser: 1.04 mg/dL — ABNORMAL HIGH (ref 0.44–1.00)
GFR, Estimated: >60 mL/min (ref >60)
Glucose, Bld: 88 mg/dL (ref 70–99)
Potassium: 4 mmol/L (ref 3.5–5.1)
Sodium: 135 mmol/L (ref 135–145)
Total Bilirubin: 0.7 mg/dL (ref 0.3–1.2)
Total Protein: 7 g/dL (ref 6.5–8.1)

## 2020-05-19 LAB — HEMOGLOBIN A1C
Hgb A1c MFr Bld: 5.4 % (ref 4.8–5.6)
Mean Plasma Glucose: 108.28 mg/dL

## 2020-05-19 NOTE — Progress Notes (Signed)
BP 119/71   Pulse 72   Temp 98.1 F (36.7 C)   Ht 5' 4.25" (1.632 m)   Wt 288 lb (130.6 kg)   SpO2 98%   BMI 49.05 kg/m    Subjective:    Patient ID: Lindsay James, female    DOB: 1990/10/23, 30 y.o.   MRN: [id number]  HPI: Lindsay James is a 30 y.o. female presenting on 05/19/2020 for No chief complaint on file.   HPI    Pt had a negative covid 19 screening questionnaire.   Pt is a 29yoF who presents to establish care.  She is currently not using any contraception as she says she is trying to get pregrant.  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  She has [place] medicaid  She has some left leg pain, her knee and the leg in general.   She denies injury.  She says it just aches at times.   Other than that, she has no complaints.       Relevant past medical, surgical, family and social history reviewed and updated as indicated. Interim medical history since our last visit reviewed. Allergies and medications reviewed and updated.   Current Outpatient Medications:  .  acetaminophen (TYLENOL) 325 MG tablet, Take 650 mg by mouth every 6 (six) hours as needed., Disp: , Rfl:  .  HYDROcodone-acetaminophen (NORCO) 5-325 MG tablet, Take 1-2 tablets by mouth every 4 (four) hours as needed for moderate pain., Disp: 40 tablet, Rfl: 0    Review of Systems  Per HPI unless specifically indicated above     Objective:    BP 119/71   Pulse 72   Temp 98.1 F (36.7 C)   Ht 5' 4.25" (1.632 m)   Wt 288 lb (130.6 kg)   SpO2 98%   BMI 49.05 kg/m   Wt Readings from Last 3 Encounters:  05/19/20 288 lb (130.6 kg)  01/26/20 283 lb (128.4 kg)  01/17/20 284 lb 12.8 oz (129.2 kg)    Physical Exam Vitals reviewed.  Constitutional:      General: She is not in acute distress.    Appearance: She is obese. She is not ill-appearing.  HENT:     Head: Normocephalic and  atraumatic.     Right Ear: Tympanic membrane and ear canal normal.     Left Ear: Tympanic membrane and ear canal normal.  Eyes:     Extraocular Movements: Extraocular movements intact.     Conjunctiva/sclera: Conjunctivae normal.     Pupils: Pupils are equal, round, and reactive to light.  Cardiovascular:     Rate and Rhythm: Normal rate and regular rhythm.     Pulses: Normal pulses.  Pulmonary:     Effort: Pulmonary effort is normal. No respiratory distress.     Breath sounds: Normal breath sounds. No wheezing or rhonchi.  Abdominal:     General: Bowel sounds are normal.     Palpations: Abdomen is soft. There is no mass.     Tenderness: There is no abdominal tenderness.  Musculoskeletal:     Cervical back: Neck supple. No tenderness.     Right knee: Normal range of motion. No tenderness.     Left knee: Normal range of motion. No tenderness.     Right lower leg: No tenderness. No edema.     Left lower leg: No tenderness.  No edema.     Comments: Knee without instability bilaterally  Lymphadenopathy:     Cervical: No cervical adenopathy.  Skin:    General: Skin is warm and dry.  Neurological:     Mental Status: She is alert and oriented to person, place, and time.     Motor: No weakness or tremor.     Gait: Gait is intact.  Psychiatric:        Attention and Perception: Attention normal.        Speech: Speech normal.        Behavior: Behavior normal. Behavior is cooperative.     Comments: Pleasant.  Converses easily.             Assessment & Plan:   Encounter Diagnoses  Name Primary?  . Encounter to establish care Yes  . Left leg pain   . Tobacco use disorder   . Morbid obesity (HCC)   . BMI 45.0-49.9, adult (HCC)   . Screening cholesterol level   . Screening for diabetes mellitus   . History of hyperlipidemia      -will get Baseline labs.  She Will be called with results -encouraged her to get covid vaccination as scheduled -will put pt on Dental list per  her request -Counseled smoking cessation -pt is counseled on healthy diet and regular exercise to help with weight management.  Discussed role of extra weight on joints and joint pains.  Encouraged healthy lifestyle to help with leg pains -pt to follow up  1 year.  She is to contact office sooner prn

## 2020-05-19 NOTE — Patient Instructions (Signed)
Healthy Eating Following a healthy eating pattern may help you to achieve and maintain a healthy body weight, reduce the risk of chronic disease, and live a long and productive life. It is important to follow a healthy eating pattern at an appropriate calorie level for your body. Your nutritional needs should be met primarily through food by choosing a variety of nutrient-rich foods. What are tips for following this plan? Reading food labels  Read labels and choose the following: ? Reduced or low sodium. ? Juices with 100% fruit juice. ? Foods with low saturated fats and high polyunsaturated and monounsaturated fats. ? Foods with whole grains, such as whole wheat, cracked wheat, brown rice, and wild rice. ? Whole grains that are fortified with folic acid. This is recommended for women who are pregnant or who want to become pregnant.  Read labels and avoid the following: ? Foods with a lot of added sugars. These include foods that contain brown sugar, corn sweetener, corn syrup, dextrose, fructose, glucose, high-fructose corn syrup, honey, invert sugar, lactose, malt syrup, maltose, molasses, raw sugar, sucrose, trehalose, or turbinado sugar.  Do not eat more than the following amounts of added sugar per day:  6 teaspoons (25 g) for women.  9 teaspoons (38 g) for men. ? Foods that contain processed or refined starches and grains. ? Refined grain products, such as white flour, degermed cornmeal, white bread, and white rice. Shopping  Choose nutrient-rich snacks, such as vegetables, whole fruits, and nuts. Avoid high-calorie and high-sugar snacks, such as potato chips, fruit snacks, and candy.  Use oil-based dressings and spreads on foods instead of solid fats such as butter, stick margarine, or cream cheese.  Limit pre-made sauces, mixes, and "instant" products such as flavored rice, instant noodles, and ready-made pasta.  Try more plant-protein sources, such as tofu, tempeh, black beans,  edamame, lentils, nuts, and seeds.  Explore eating plans such as the Mediterranean diet or vegetarian diet. Cooking  Use oil to saut or stir-fry foods instead of solid fats such as butter, stick margarine, or lard.  Try baking, boiling, grilling, or broiling instead of frying.  Remove the fatty part of meats before cooking.  Steam vegetables in water or broth. Meal planning  At meals, imagine dividing your plate into fourths: ? One-half of your plate is fruits and vegetables. ? One-fourth of your plate is whole grains. ? One-fourth of your plate is protein, especially lean meats, poultry, eggs, tofu, beans, or nuts.  Include low-fat dairy as part of your daily diet.   Lifestyle  Choose healthy options in all settings, including home, work, school, restaurants, or stores.  Prepare your food safely: ? Wash your hands after handling raw meats. ? Keep food preparation surfaces clean by regularly washing with hot, soapy water. ? Keep raw meats separate from ready-to-eat foods, such as fruits and vegetables. ? Cook seafood, meat, poultry, and eggs to the recommended internal temperature. ? Store foods at safe temperatures. In general:  Keep cold foods at 7F (4.4C) or below.  Keep hot foods at 17F (60C) or above.  Keep your freezer at [facility] (-17.8C) or below.  Foods are no longer safe to eat when they have been between the temperatures of 40-17F (4.4-60C) for more than 2 hours. What foods should I eat? Fruits Aim to eat 2 cup-equivalents of fresh, canned (in natural juice), or frozen fruits each day. Examples of 1 cup-equivalent of fruit include 1 small apple, 8 large strawberries, 1 cup canned fruit,  cup dried fruit, or 1 cup 100% juice. Vegetables Aim to eat 2-3 cup-equivalents of fresh and frozen vegetables each day, including different varieties and colors. Examples of 1 cup-equivalent of vegetables include 2 medium carrots, 2 cups raw, leafy greens, 1 cup chopped  vegetable (raw or cooked), or 1 medium baked potato. Grains Aim to eat 6 ounce-equivalents of whole grains each day. Examples of 1 ounce-equivalent of grains include 1 slice of bread, 1 cup ready-to-eat cereal, 3 cups popcorn, or  cup cooked rice, pasta, or cereal. Meats and other proteins Aim to eat 5-6 ounce-equivalents of protein each day. Examples of 1 ounce-equivalent of protein include 1 egg, 1/2 cup nuts or seeds, or 1 tablespoon (16 g) peanut butter. A cut of meat or fish that is the size of a deck of cards is about 3-4 ounce-equivalents.  Of the protein you eat each week, try to have at least 8 ounces come from seafood. This includes salmon, trout, herring, and anchovies. Dairy Aim to eat 3 cup-equivalents of fat-free or low-fat dairy each day. Examples of 1 cup-equivalent of dairy include 1 cup (240 mL) milk, 8 ounces (250 g) yogurt, 1 ounces (44 g) natural cheese, or 1 cup (240 mL) fortified soy milk. Fats and oils  Aim for about 5 teaspoons (21 g) per day. Choose monounsaturated fats, such as canola and olive oils, avocados, peanut butter, and most nuts, or polyunsaturated fats, such as sunflower, corn, and soybean oils, walnuts, pine nuts, sesame seeds, sunflower seeds, and flaxseed. Beverages  Aim for six 8-oz glasses of water per day. Limit coffee to three to five 8-oz cups per day.  Limit caffeinated beverages that have added calories, such as soda and energy drinks.  Limit alcohol intake to no more than 1 drink a day for nonpregnant women and 2 drinks a day for men. One drink equals 12 oz of beer (355 mL), 5 oz of wine (148 mL), or 1 oz of hard liquor (44 mL). Seasoning and other foods  Avoid adding excess amounts of salt to your foods. Try flavoring foods with herbs and spices instead of salt.  Avoid adding sugar to foods.  Try using oil-based dressings, sauces, and spreads instead of solid fats. This information is based on general [place] nutrition guidelines. For more  information, visit choosemyplate.gov. Exact amounts may vary based on your nutrition needs. Summary  A healthy eating plan may help you to maintain a healthy weight, reduce the risk of chronic diseases, and stay active throughout your life.  Plan your meals. Make sure you eat the right portions of a variety of nutrient-rich foods.  Try baking, boiling, grilling, or broiling instead of frying.  Choose healthy options in all settings, including home, work, school, restaurants, or stores. This information is not intended to replace advice given to you by your health care provider. Make sure you discuss any questions you have with your health care provider. Document Revised: 06/17/2017 Document Reviewed: 06/17/2017 Elsevier Patient Education  2021 Elsevier Inc.  

## 2020-05-23 ENCOUNTER — Telehealth: Payer: Self-pay

## 2020-05-23 NOTE — Telephone Encounter (Signed)
Called to follow up with client regarding recent new patient visit with [facility].  No answer left voicemail requesting return call.  Francee Nodal RN [employer]

## 2020-05-28 IMAGING — DX DG WRIST COMPLETE 3+V*L*
4 series · 4 of 4 positions shown · non-contrast
Comparison: None

CLINICAL DATA: Tripped over chair yesterday and fell onto LEFT
side, LEFT wrist pain since

EXAM:
LEFT WRIST - COMPLETE 3+ VIEW

[wrist ap (1 of 2)]
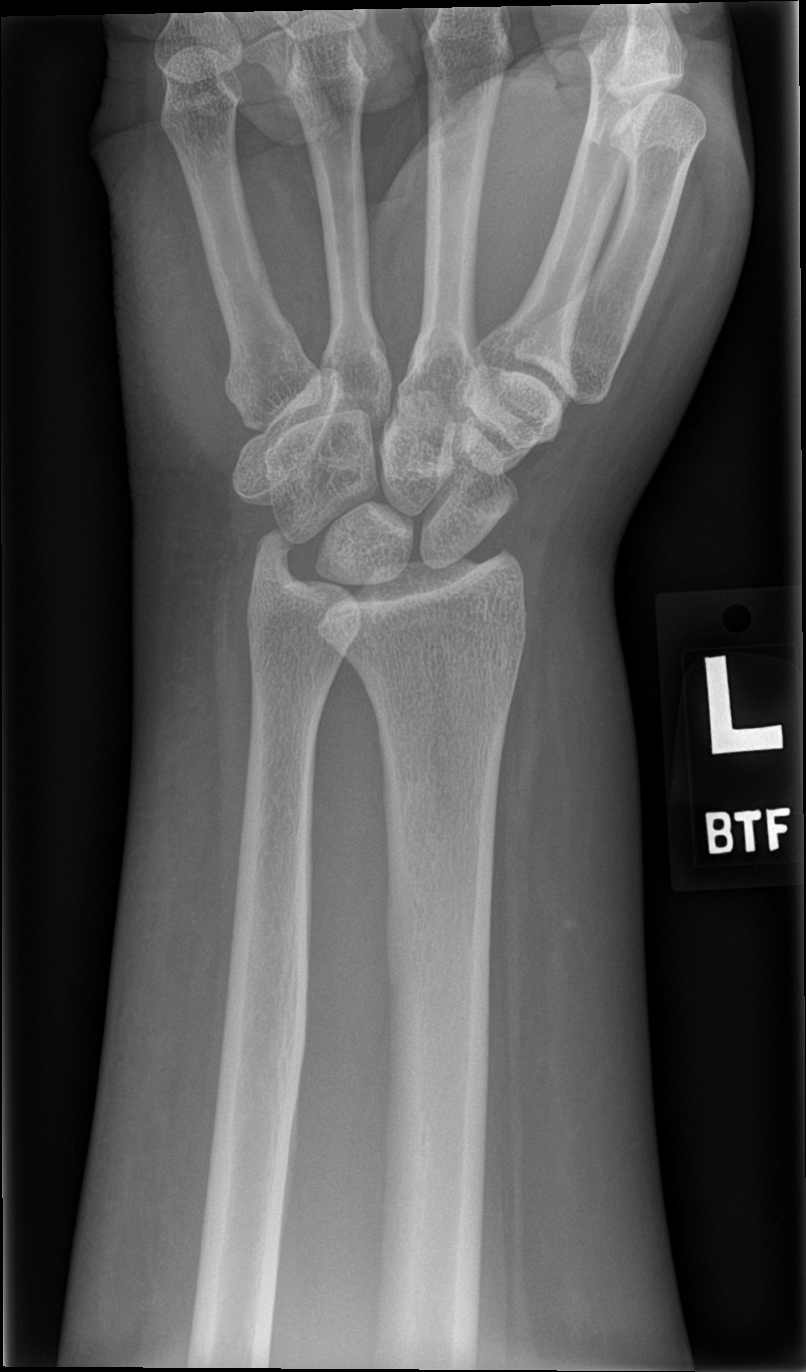

[wrist obl]
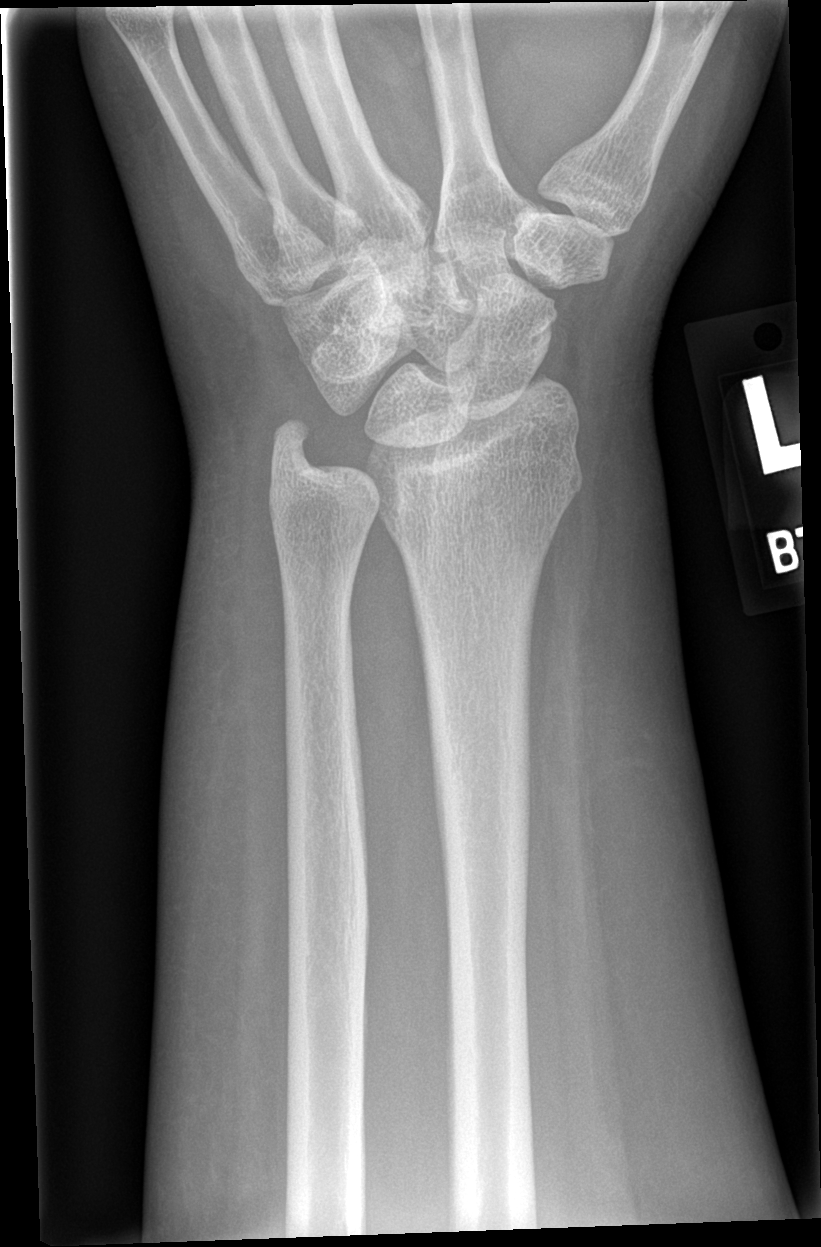

[wrist tunnel]
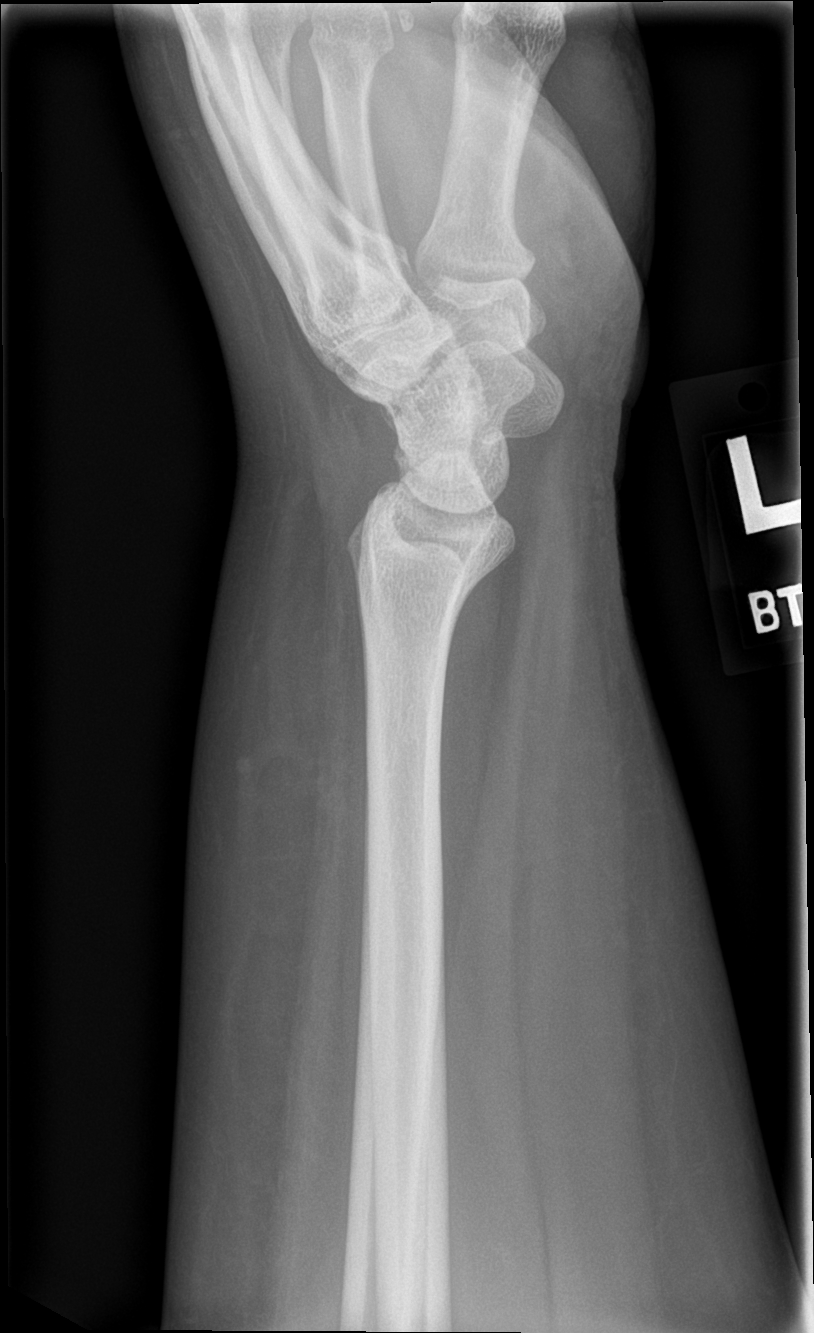

[wrist ap (2 of 2)]
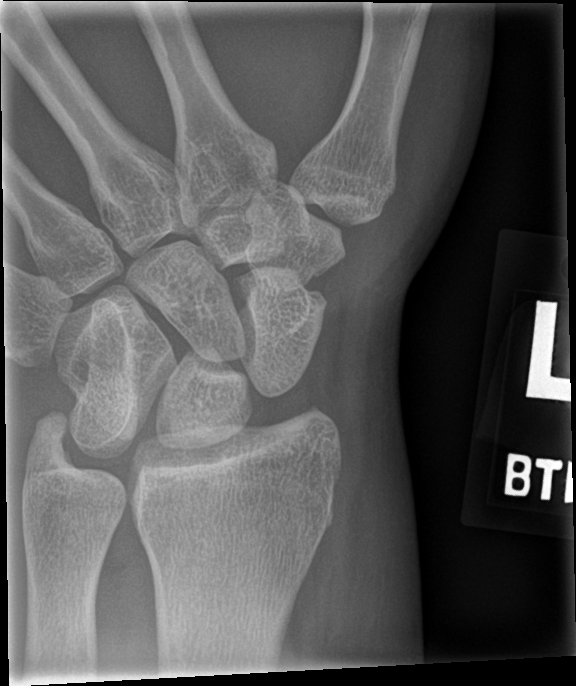

[4 of 4 positions shown; findings below may reference images not displayed]

FINDINGS: Osseous mineralization normal.

Congenital fusion of the hamate and triquetrum.

Nonvisualization of pisiform.

Joint spaces preserved.

No acute fracture, dislocation, or bone destruction.
IMPRESSION: No acute osseous abnormalities.

Congenital triquetrum-hamate fusion with nonvisualization of
pisiform.

## 2020-10-21 ENCOUNTER — Ambulatory Visit
Admission: EM | Admit: 2020-10-21 | Discharge: 2020-10-21 | Disposition: A | Discharge status: Home / Self Care | Payer: PRIVATE HEALTH INSURANCE | Attending: Emergency Medicine | Admitting: Emergency Medicine

## 2020-10-21 ENCOUNTER — Encounter: Payer: Self-pay | Admitting: Emergency Medicine

## 2020-10-21 DIAGNOSIS — M25572 Pain in left ankle and joints of left foot: Secondary | ICD-10-CM

## 2020-10-21 DIAGNOSIS — G8929 Other chronic pain: Secondary | ICD-10-CM

## 2020-10-21 DIAGNOSIS — M25562 Pain in left knee: Secondary | ICD-10-CM | POA: Diagnosis not present

## 2020-10-21 DIAGNOSIS — M79671 Pain in right foot: Secondary | ICD-10-CM

## 2020-10-21 MED ORDER — MELOXICAM 7.5 MG PO TABS: 7.5000 mg | ORAL_TABLET | Freq: Every day | ORAL | 0 refills | Status: DC

## 2020-10-21 NOTE — ED Triage Notes (Signed)
Leg and foot pain on LT side  when standing and pain to bottom of RT foot when standing x 1 year . Denies any injury

## 2020-10-21 NOTE — ED Provider Notes (Signed)
[facility]   [id number] 10/21/20 Arrival Time: 1425  CC: leg pain  SUBJECTIVE: History from: patient. Lindsay James is a 30 y.o. female complains of chronic LT knee, LT ankle and RT foot pain x 6 months.  Reports injury to RT foot as last job, otherwise no new injuries.  Localizes the pain to the front of LT knee and RT foot.  Describes the pain as intermittent and sharp in character.  Has tried norco without relief.  Symptoms are made worse with weight-bearing.  Denies similar symptoms in the past.  Reports associated LE swelling.  Denies fever, chills, erythema, ecchymosis, weakness, numbness and tingling.  ROS: As per HPI.  All other pertinent ROS negative.     Past Medical History:  Diagnosis Date   Chlamydia    Diabetes mellitus without complication (HCC)    Gonorrhea    High cholesterol    Migraine    Past Surgical History:  Procedure Laterality Date   LAPAROSCOPIC APPENDECTOMY N/A 01/18/2020   Procedure: APPENDECTOMY LAPAROSCOPIC;  Surgeon: Lindsay Macho, MD;  Location: [facility];  Service: General;  Laterality: N/A;   Allergies  Allergen Reactions   Peanut-Containing Drug Products Rash   No current facility-administered medications on file prior to encounter.   Current Outpatient Medications on File Prior to Encounter  Medication Sig Dispense Refill   acetaminophen (TYLENOL) 325 MG tablet Take 650 mg by mouth every 6 (six) hours as needed.     HYDROcodone-acetaminophen (NORCO) 5-325 MG tablet Take 1-2 tablets by mouth every 4 (four) hours as needed for moderate pain. 40 tablet 0   [DISCONTINUED] promethazine (PHENERGAN) 25 MG tablet Take 1 tablet (25 mg total) by mouth every 6 (six) hours as needed for nausea or vomiting. 30 tablet 0   Social History   Socioeconomic History   Marital status: Single    Spouse name: Not on file   Number of children: Not on file   Years of education: Not on file   Highest education level: Not on file  Occupational History    Not on file  Tobacco Use   Smoking status: Some Days    Packs/day: 0.50    Types: Cigarettes, Cigars   Smokeless tobacco: Never   Tobacco comments:    1 black & mild daily.  stopped cigarettes  nov 2021  Vaping Use   Vaping Use: Never used  Substance and Sexual Activity   Alcohol use: Yes    Comment: socially   Drug use: Yes    Types: Marijuana   Sexual activity: Yes    Birth control/protection: None  Other Topics Concern   Not on file  Social History Narrative   Not on file   Social Determinants of Health   Financial Resource Strain: Not on file  Food Insecurity: Food Insecurity Present   Worried About Running Out of Food in the Last Year: Sometimes true   Ran Out of Food in the Last Year: Sometimes true  Transportation Needs: No Transportation Needs   Lack of Transportation (Medical): No   Lack of Transportation (Non-Medical): No  Physical Activity: Not on file  Stress: Not on file  Social Connections: Not on file  Intimate Partner Violence: Not on file   Family History  Problem Relation Age of Onset   Diabetes Maternal Grandmother    Arthritis Mother    Cancer Father     OBJECTIVE:  Vitals:   10/21/20 1533  BP: 132/80  Pulse: 68  Resp: 16  Temp: (!) 97.3 F (36.3 C)  TempSrc: Tympanic  SpO2: 98%    General appearance: ALERT; in no acute distress.  Head: NCAT Lungs: Normal respiratory effort Musculoskeletal: knee; RT foot; LT ankle Inspection: Skin warm, dry, clear and intact without obvious erythema, effusion, or ecchymosis.  Palpation: diffusely TTP over LT anterior knee, plantar aspect of RT foot, and LT ankle ROM: FROM active and passive Skin: warm and dry; thickened toenails and calluses to RT foot Neurologic: Ambulates without difficulty Psychological: alert and cooperative; normal mood and affect   ASSESSMENT & PLAN:  1. Chronic pain of left knee   2. Chronic foot pain, right   3. Chronic pain of left ankle    Meds ordered this  encounter  Medications   meloxicam (MOBIC) 7.5 MG tablet    Sig: Take 1 tablet (7.5 mg total) by mouth daily.    Dispense:  30 tablet    Refill:  0    Order Specific Question:   Supervising Provider    Answer:   Lindsay James [[id number]]    Continue conservative management of rest, ice, and elevation Ace applied to LT knee and LT ankle Take mobic as needed for pain relief (may cause abdominal discomfort, ulcers, and GI bleeds avoid taking with other NSAIDs) Follow up with orthopedist since you are unable to get in with PCP Return or go to the ER if you have any new or worsening symptoms (fever, chills, chest pain, redness, swelling, bruising, deformity, etc...)    Reviewed expectations re: course of current medical issues. Questions answered. Outlined signs and symptoms indicating need for more acute intervention. Patient verbalized understanding. After Visit Summary given.     Lindsay Harding, PA-C 10/21/20 1626

## 2020-10-21 NOTE — Discharge Instructions (Addendum)
Continue conservative management of rest, ice, and elevation Ace applied to LT knee and LT ankle Take mobic as needed for pain relief (may cause abdominal discomfort, ulcers, and GI bleeds avoid taking with other NSAIDs) Follow up with orthopedist since you are unable to get in with PCP Return or go to the ER if you have any new or worsening symptoms (fever, chills, chest pain, redness, swelling, bruising, deformity, etc...)

## 2020-11-01 ENCOUNTER — Telehealth: Payer: Self-pay

## 2020-11-01 NOTE — Telephone Encounter (Signed)
Checked and confirmed with client that she has received an appointment for 11/03/20 at 0830 to follow up regarding complaints of leg pain with provider at [facility].  Francee Nodal RN [employer]

## 2020-11-01 NOTE — Telephone Encounter (Signed)
Attempted to return call to client and no answer left message requesting return call.  Francee Nodal RN [facility]

## 2020-11-03 ENCOUNTER — Ambulatory Visit: Payer: Medicaid Other | Admitting: Physician Assistant

## 2020-11-03 ENCOUNTER — Encounter: Payer: Self-pay | Admitting: Physician Assistant

## 2020-11-03 DIAGNOSIS — Z6841 Body Mass Index (BMI) 40.0 and over, adult: Secondary | ICD-10-CM

## 2020-11-03 DIAGNOSIS — M79605 Pain in left leg: Secondary | ICD-10-CM

## 2020-11-03 DIAGNOSIS — B353 Tinea pedis: Secondary | ICD-10-CM

## 2020-11-03 DIAGNOSIS — L84 Corns and callosities: Secondary | ICD-10-CM

## 2020-11-03 DIAGNOSIS — M79671 Pain in right foot: Secondary | ICD-10-CM

## 2020-11-03 DIAGNOSIS — L608 Other nail disorders: Secondary | ICD-10-CM

## 2020-11-03 MED ORDER — MELOXICAM 7.5 MG PO TABS: 7.5000 mg | ORAL_TABLET | Freq: Every day | ORAL | 1 refills | Status: DC | PRN

## 2020-11-03 NOTE — Progress Notes (Signed)
BP 105/68   Pulse 80   Temp 97.8 F (36.6 C)   Wt 288 lb (130.6 kg)   SpO2 99%   BMI 49.05 kg/m    Subjective:    Patient ID: Lindsay James, female    DOB: Dec 23, 1990, 30 y.o.   MRN: [id number]  HPI: Lindsay James is a 30 y.o. female presenting on 11/03/2020 for No chief complaint on file.   HPI   Pt had a negative covid 19 screening questionnaire.     Pt is 30yoF who presents for foot and leg pain.   She was seen in ER for this and given Mobic which she says helps some but her activities exacerbate her pain.   She is working at [employer].  Her shifts are 8 or 9 hours.  She usually works 5 days/week.    She is having Pain in legs.  It Hurts when she walks.  She can "feel her bones moving" when she walks.  She says her legs "feel like jello".  She hurts left leg from the knee down.  It is all the time.  It keeps her awake at night.  It is worse when she stands on it all day.   This has been getting worse since beginning of June.   She also c/o right foot problems.  She says it has sores.  She peels the skin off it and it will grow back.  She says it makes it hard for her to walk because it creates a burning sensation.  This makes it hard for her to walk.  She has about 5 of these places .  These have been there for about since late march or early April .      Relevant past medical, surgical, family and social history reviewed and updated as indicated. Interim medical history since our last visit reviewed. Allergies and medications reviewed and updated.   Current Outpatient Medications:    meloxicam (MOBIC) 7.5 MG tablet, Take 1 tablet (7.5 mg total) by mouth daily., Disp: 30 tablet, Rfl: 0   acetaminophen (TYLENOL) 325 MG tablet, Take 650 mg by mouth every 6 (six) hours as needed. (Patient not taking: Reported on 11/03/2020), Disp: , Rfl:    HYDROcodone-acetaminophen (NORCO) 5-325 MG tablet, Take 1-2 tablets by mouth every 4 (four) hours as needed for moderate pain.  (Patient not taking: Reported on 11/03/2020), Disp: 40 tablet, Rfl: 0     Review of Systems  Per HPI unless specifically indicated above     Objective:    BP 105/68   Pulse 80   Temp 97.8 F (36.6 C)   Wt 288 lb (130.6 kg)   SpO2 99%   BMI 49.05 kg/m   Wt Readings from Last 3 Encounters:  11/03/20 288 lb (130.6 kg)  05/19/20 288 lb (130.6 kg)  01/26/20 283 lb (128.4 kg)    Physical Exam Constitutional:      General: She is not in acute distress.    Appearance: She is obese. She is not toxic-appearing.  HENT:     Head: Normocephalic and atraumatic.  Cardiovascular:     Pulses:          Dorsalis pedis pulses are 2+ on the right side and 2+ on the left side.  Pulmonary:     Effort: No respiratory distress.  Musculoskeletal:     Left knee: Normal range of motion. No tenderness.     Right lower leg: No edema.  Left lower leg: No tenderness or bony tenderness. No edema.     Comments: Exam of knees were complicated by habitus  Feet:     Right foot:     Skin integrity: Callus and dry skin present.     Toenail Condition: Right toenails are abnormally thick.     Left foot:     Skin integrity: Callus and dry skin present.     Toenail Condition: Left toenails are abnormally thick.     Comments: Early maceration L foot webspace between great toe and 2nd toe Multiple corns right foot Neurological:     Mental Status: She is alert and oriented to person, place, and time.  Psychiatric:        Behavior: Behavior normal.           Assessment & Plan:     Encounter Diagnoses  Name Primary?   Morbid obesity (HCC) Yes   BMI 45.0-49.9, adult (HCC)    Left leg pain    Right foot pain    Corns    Deformity of toenail    Tinea pedis, unspecified laterality       -will refer to podiatrist for corns and thickened toenails -pt is encouraged to get Shoes with supportive insole or get inserts -again discussed with pt the need for Weight loss to help her chronic leg  pain.  Discussed healthy diet and exercise.  Will refer to RD for assistance -pt was counseled on care for tinea pedis -pt will be referred to PT for the left leg pain -Refilled mobic.  Pt warned to not get pregnant on this prescription.  She says she is currently not at risk for getting pregnant -pt is given application for [employer] financial assistance -pt to follow up as scheduled

## 2020-11-03 NOTE — Patient Instructions (Signed)
Corns and Calluses Corns are small areas of thickened skin that form on the top, sides, or tip of a toe. Corns have a cone-shaped core with a point that can press on a nerve below. This causes pain. Calluses are areas of thickened skin that can form anywhere on the body, including the hands, fingers, palms, soles of the feet, and heels. Calluses are usually larger than corns. What are the causes? Corns and calluses are caused by rubbing (friction) or pressure, such as from shoes that are too tight or do not fit properly. What increases the risk? Corns are more likely to develop in people who have misshapen toes (toe deformities), such as hammer toes. Calluses can form with friction to any area of the skin. They are more likely to develop in people who: Work with their hands. Wear shoes that fit poorly, are too tight, or are high-heeled. Have toe deformities. What are the signs or symptoms? Symptoms of a corn or callus include: A hard growth on the skin. Pain or tenderness under the skin. Redness and swelling. Increased discomfort while wearing tight-fitting shoes, if your feet are affected. If a corn or callus becomes infected, symptoms may include: Redness and swelling that gets worse. Pain. Fluid, blood, or pus draining from the corn or callus. How is this diagnosed? Corns and calluses may be diagnosed based on your symptoms, your medical history, and a physical exam. How is this treated? Treatment for corns and calluses may include: Removing the cause of the friction or pressure. This may involve: Changing your shoes. Wearing shoe inserts (orthotics) or other protective layers in your shoes, such as a corn pad. Wearing gloves. Applying medicine to the skin (topical medicine) to help soften skin in the hardened, thickened areas. Removing layers of dead skin with a file to reduce the size of the corn or callus. Removing the corn or callus with a scalpel or laser. Taking antibiotic  medicines, if your corn or callus is infected. Having surgery, if a toe deformity is the cause. Follow these instructions at home:  Take over-the-counter and prescription medicines only as told by your health care provider. If you were prescribed an antibiotic medicine, take it as told by your health care provider. Do not stop taking it even if your condition improves. Wear shoes that fit well. Avoid wearing high-heeled shoes and shoes that are too tight or too loose. Wear any padding, protective layers, gloves, or orthotics as told by your health care provider. Soak your hands or feet. Then use a file or pumice stone to soften your corn or callus. Do this as told by your health care provider. Check your corn or callus every day for signs of infection. Contact a health care provider if: Your symptoms do not improve with treatment. You have redness or swelling that gets worse. Your corn or callus becomes painful. You have fluid, blood, or pus coming from your corn or callus. You have new symptoms. Get help right away if: You develop severe pain with redness. Summary Corns are small areas of thickened skin that form on the top, sides, or tip of a toe. These can be painful. Calluses are areas of thickened skin that can form anywhere on the body, including the hands, fingers, palms, and soles of the feet. Calluses are usually larger than corns. Corns and calluses are caused by rubbing (friction) or pressure, such as from shoes that are too tight or do not fit properly. Treatment may include wearing padding, protective   layers, gloves, or orthotics as told by your health care provider. This information is not intended to replace advice given to you by your health care provider. Make sure you discuss any questions you have with your health care provider. Document Revised: 07/02/2019 Document Reviewed: 07/02/2019 Elsevier Patient Education  2022 Elsevier Inc.  

## 2020-11-04 ENCOUNTER — Telehealth: Payer: Self-pay

## 2020-11-04 NOTE — Telephone Encounter (Signed)
Called to follow up with [facility] client after a visit with [facility] regarding leg and foot pain. No answer, left message requesting return call.  Francee Nodal RN [employer]

## 2020-11-22 ENCOUNTER — Other Ambulatory Visit: Payer: Self-pay

## 2020-11-22 ENCOUNTER — Emergency Department (HOSPITAL_COMMUNITY)
Admission: EM | Admit: 2020-11-22 | Discharge: 2020-11-22 | Disposition: A | Discharge status: Home / Self Care | Payer: PRIVATE HEALTH INSURANCE | Attending: Emergency Medicine | Admitting: Emergency Medicine

## 2020-11-22 ENCOUNTER — Encounter (HOSPITAL_COMMUNITY): Payer: Self-pay | Admitting: *Deleted

## 2020-11-22 DIAGNOSIS — Z91010 Allergy to peanuts: Secondary | ICD-10-CM | POA: Insufficient documentation

## 2020-11-22 DIAGNOSIS — E119 Type 2 diabetes mellitus without complications: Secondary | ICD-10-CM | POA: Insufficient documentation

## 2020-11-22 DIAGNOSIS — G8929 Other chronic pain: Secondary | ICD-10-CM | POA: Insufficient documentation

## 2020-11-22 DIAGNOSIS — F17210 Nicotine dependence, cigarettes, uncomplicated: Secondary | ICD-10-CM | POA: Insufficient documentation

## 2020-11-22 DIAGNOSIS — M25562 Pain in left knee: Secondary | ICD-10-CM | POA: Insufficient documentation

## 2020-11-22 DIAGNOSIS — M25572 Pain in left ankle and joints of left foot: Secondary | ICD-10-CM | POA: Insufficient documentation

## 2020-11-22 LAB — CBG MONITORING, ED: Glucose-Capillary: 115 mg/dL — ABNORMAL HIGH (ref 70–99)

## 2020-11-22 MED ORDER — PREDNISONE 50 MG PO TABS: ORAL_TABLET | ORAL | 0 refills | Status: DC

## 2020-11-22 NOTE — ED Provider Notes (Addendum)
[facility] EMERGENCY DEPARTMENT Provider Note   CSN: [id number] Arrival date & time: 11/22/20  1123     History No chief complaint on file.   Lindsay James is a 30 y.o. female.  The history is provided by the patient. No language interpreter was used.  Knee Pain Location:  Knee and ankle Time since incident:  6 months Injury: no   Knee location:  L knee Ankle location:  L ankle Pain details:    Quality:  Aching   Radiates to:  Does not radiate   Severity:  Moderate   Timing:  Constant   Progression:  Worsening Chronicity:  New Prior injury to area:  No Relieved by:  Nothing Worsened by:  Nothing Ineffective treatments:  Elevation and NSAIDs Risk factors: no concern for non-accidental trauma   Pt reports she has pain in her knee and ankle.  Pt reports her MD told her tried to send her to PT but she can not go due to insurance.  Pt reports no relief with meloxicam.  Pt reports difficulty walking and trouble working     Past Medical History:  Diagnosis Date   Chlamydia    Diabetes mellitus without complication (HCC)    Gonorrhea    High cholesterol    Migraine     Patient Active Problem List   Diagnosis Date Noted   Acute appendicitis    [redacted] weeks gestation of pregnancy    Cerebral ventriculomegaly of fetus    Cervical insufficiency during pregnancy in second trimester, antepartum    [redacted] weeks gestation of pregnancy     Past Surgical History:  Procedure Laterality Date   LAPAROSCOPIC APPENDECTOMY N/A 01/18/2020   Procedure: APPENDECTOMY LAPAROSCOPIC;  Surgeon: Franky Macho, MD;  Location: [facility];  Service: General;  Laterality: N/A;     OB History     Gravida  1   Para      Term      Preterm      AB      Living  1      SAB      IAB      Ectopic      Multiple      Live Births  1           Family History  Problem Relation Age of Onset   Diabetes Maternal Grandmother    Arthritis Mother    Cancer Father     Social History    Tobacco Use   Smoking status: Some Days    Packs/day: 0.50    Types: Cigarettes, Cigars   Smokeless tobacco: Never   Tobacco comments:    1 black & mild daily.  stopped cigarettes  nov 2021  Vaping Use   Vaping Use: Never used  Substance Use Topics   Alcohol use: Yes    Comment: socially   Drug use: Yes    Types: Marijuana    Home Medications Prior to Admission medications   Medication Sig Start Date End Date Taking? Authorizing Provider  predniSONE (DELTASONE) 50 MG tablet One tablet a day for 5 days 11/22/20  Yes Elson Areas, PA-C  acetaminophen (TYLENOL) 325 MG tablet Take 650 mg by mouth every 6 (six) hours as needed. Patient not taking: Reported on 11/03/2020    [provider]  HYDROcodone-acetaminophen (NORCO) 5-325 MG tablet Take 1-2 tablets by mouth every 4 (four) hours as needed for moderate pain. Patient not taking: Reported on 11/03/2020 01/18/20   Lovell Sheehan,  Loraine Leriche, MD  meloxicam (MOBIC) 7.5 MG tablet Take 1 tablet (7.5 mg total) by mouth daily as needed for pain. 11/03/20   Jacquelin Hawking, PA-C  promethazine (PHENERGAN) 25 MG tablet Take 1 tablet (25 mg total) by mouth every 6 (six) hours as needed for nausea or vomiting. 07/22/19 10/28/19  Gilda Crease, MD    Allergies    Peanut-containing drug products  Review of Systems   Review of Systems  All other systems reviewed and are negative.  Physical Exam Updated Vital Signs BP 127/65 (BP Location: Right Arm)   Pulse 74   Temp 98.6 F (37 C)   Resp 18   Ht 5\' 4"  (1.626 m)   Wt 130.6 kg   LMP 11/11/2020   SpO2 100%   BMI 49.44 kg/m   Physical Exam Vitals reviewed.  Cardiovascular:     Rate and Rhythm: Normal rate.  Pulmonary:     Effort: Pulmonary effort is normal.  Musculoskeletal:        General: No swelling or tenderness.     Comments: Pain with range of motion  nv and ns intact   Skin:    General: Skin is warm.  Neurological:     General: No focal deficit present.     Mental  Status: She is alert.  Psychiatric:        Mood and Affect: Mood normal.    ED Results / Procedures / Treatments   Labs (all labs ordered are listed, but only abnormal results are displayed) Labs Reviewed  CBG MONITORING, ED - Abnormal; Notable for the following components:      Result Value   Glucose-Capillary 115 (*)    All other components within normal limits    EKG None  Radiology No results found.  Procedures Procedures   Medications Ordered in ED Medications - No data to display  ED Course  I have reviewed the triage vital signs and the nursing notes.  Pertinent labs & imaging results that were available during my care of the patient were reviewed by me and considered in my medical decision making (see chart for details).    MDM Rules/Calculators/A&P                           MDM:  I will try pt on a short course of prednisone  Pt advised to follow up with her MD for recheck Final Clinical Impression(s) / ED Diagnoses Final diagnoses:  Chronic pain of left lower extremity    Rx / DC Orders ED Discharge Orders          Ordered    predniSONE (DELTASONE) 50 MG tablet        11/22/20 1400          An After Visit Summary was printed and given to the patient.    01/22/21, PA-C 11/22/20 1726    01/22/21 11/22/20 1727    01/22/21, MD 11/23/20 1324

## 2020-11-22 NOTE — ED Triage Notes (Signed)
LEFT LEG PAIN FOR 6 MONTHS

## 2020-11-22 NOTE — Discharge Instructions (Addendum)
Follow up with your Physician for recheck  

## 2020-12-14 ENCOUNTER — Other Ambulatory Visit: Payer: Self-pay

## 2020-12-14 ENCOUNTER — Encounter: Payer: Self-pay | Admitting: Emergency Medicine

## 2020-12-14 ENCOUNTER — Ambulatory Visit
Admission: EM | Admit: 2020-12-14 | Discharge: 2020-12-14 | Disposition: A | Discharge status: Home / Self Care | Payer: Self-pay | Attending: Physician Assistant | Admitting: Physician Assistant

## 2020-12-14 DIAGNOSIS — Z20822 Contact with and (suspected) exposure to covid-19: Secondary | ICD-10-CM

## 2020-12-14 NOTE — ED Provider Notes (Signed)
[facility]    CSN: [id number] Arrival date & time: 12/14/20  0809      History   Chief Complaint No chief complaint on file.   HPI Lindsay James is a 30 y.o. female.   Pt complains of a cough and sore throat  Pt exposed to covid.    The history is provided by the patient. No language interpreter was used.  Cough Cough characteristics:  Non-productive Sputum characteristics:  Nondescript Severity:  Moderate Onset quality:  Gradual Duration:  2 days Timing:  Constant Relieved by:  Nothing Worsened by:  Nothing Ineffective treatments:  None tried  Past Medical History:  Diagnosis Date   Chlamydia    Diabetes mellitus without complication (HCC)    Gonorrhea    High cholesterol    Migraine     Patient Active Problem List   Diagnosis Date Noted   Acute appendicitis    [redacted] weeks gestation of pregnancy    Cerebral ventriculomegaly of fetus    Cervical insufficiency during pregnancy in second trimester, antepartum    [redacted] weeks gestation of pregnancy     Past Surgical History:  Procedure Laterality Date   LAPAROSCOPIC APPENDECTOMY N/A 01/18/2020   Procedure: APPENDECTOMY LAPAROSCOPIC;  Surgeon: Franky Macho, MD;  Location: [facility];  Service: General;  Laterality: N/A;    OB History     Gravida  1   Para      Term      Preterm      AB      Living  1      SAB      IAB      Ectopic      Multiple      Live Births  1            Home Medications    Prior to Admission medications   Medication Sig Start Date End Date Taking? Authorizing Provider  acetaminophen (TYLENOL) 325 MG tablet Take 650 mg by mouth every 6 (six) hours as needed. Patient not taking: Reported on 11/03/2020    [provider]  HYDROcodone-acetaminophen (NORCO) 5-325 MG tablet Take 1-2 tablets by mouth every 4 (four) hours as needed for moderate pain. Patient not taking: Reported on 11/03/2020 01/18/20   Franky Macho, MD  meloxicam (MOBIC) 7.5 MG  tablet Take 1 tablet (7.5 mg total) by mouth daily as needed for pain. 11/03/20   Jacquelin Hawking, PA-C  predniSONE (DELTASONE) 50 MG tablet One tablet a day for 5 days 11/22/20   Elson Areas, PA-C  promethazine (PHENERGAN) 25 MG tablet Take 1 tablet (25 mg total) by mouth every 6 (six) hours as needed for nausea or vomiting. 07/22/19 10/28/19  Pollina, Canary Brim, MD    Family History Family History  Problem Relation Age of Onset   Diabetes Maternal Grandmother    Arthritis Mother    Cancer Father     Social History Social History   Tobacco Use   Smoking status: Some Days    Packs/day: 0.50    Types: Cigarettes, Cigars   Smokeless tobacco: Never   Tobacco comments:    1 black & mild daily.  stopped cigarettes  nov 2021  Vaping Use   Vaping Use: Never used  Substance Use Topics   Alcohol use: Yes    Comment: socially   Drug use: Yes    Types: Marijuana     Allergies   Peanut-containing drug products   Review of Systems Review of  Systems  Respiratory:  Positive for cough.   All other systems reviewed and are negative.   Physical Exam Triage Vital Signs ED Triage Vitals  Enc Vitals Group     BP 12/14/20 0822 121/82     Pulse Rate 12/14/20 0822 (!) 102     Resp 12/14/20 0822 18     Temp 12/14/20 0822 98.5 F (36.9 C)     Temp Source 12/14/20 0822 Oral     SpO2 12/14/20 0822 98 %     Weight --      Height --      Head Circumference --      Peak Flow --      Pain Score 12/14/20 0823 10     Pain Loc --      Pain Edu? --      Excl. in GC? --    No data found.  Updated Vital Signs BP 121/82 (BP Location: Right Arm)   Pulse (!) 102   Temp 98.5 F (36.9 C) (Oral)   Resp 18   LMP 12/06/2020 (Approximate)   SpO2 98%   Visual Acuity Right Eye Distance:   Left Eye Distance:   Bilateral Distance:    Right Eye Near:   Left Eye Near:    Bilateral Near:     Physical Exam Vitals and nursing note reviewed.  Constitutional:      General: She is not  in acute distress.    Appearance: She is well-developed.  HENT:     Head: Normocephalic and atraumatic.  Eyes:     Conjunctiva/sclera: Conjunctivae normal.  Cardiovascular:     Rate and Rhythm: Normal rate and regular rhythm.     Heart sounds: No murmur heard. Pulmonary:     Effort: Pulmonary effort is normal. No respiratory distress.     Breath sounds: Normal breath sounds.  Musculoskeletal:     Cervical back: Neck supple.  Skin:    General: Skin is warm and dry.  Neurological:     Mental Status: She is alert.     UC Treatments / Results  Labs (all labs ordered are listed, but only abnormal results are displayed) Labs Reviewed  COVID-19, FLU A+B NAA    EKG   Radiology No results found.  Procedures Procedures (including critical care time)  Medications Ordered in UC Medications - No data to display  Initial Impression / Assessment and Plan / UC Course  I have reviewed the triage vital signs and the nursing notes.  Pertinent labs & imaging results that were available during my care of the patient were reviewed by me and considered in my medical decision making (see chart for details).     MDM:  Covid pending Final Clinical Impressions(s) / UC Diagnoses   Final diagnoses:  Exposure to COVID-19 virus     Discharge Instructions      Covid is pending   ED Prescriptions   None    PDMP not reviewed this encounter. An After Visit Summary was printed and given to the patient.    Elson Areas, [place] 12/14/20 [phone]

## 2020-12-14 NOTE — Discharge Instructions (Addendum)
Covid is pending  

## 2020-12-14 NOTE — ED Triage Notes (Signed)
Body aches, sore throat, cough since yesterday.  At home covid test was negative.  Exposed to covid by a co-worker

## 2020-12-16 LAB — COVID-19, FLU A+B NAA
Influenza A, NAA: NOT DETECTED
Influenza B, NAA: NOT DETECTED
SARS-CoV-2, NAA: NOT DETECTED

## 2020-12-17 ENCOUNTER — Encounter (HOSPITAL_COMMUNITY): Payer: Self-pay | Admitting: Emergency Medicine

## 2020-12-17 ENCOUNTER — Emergency Department (HOSPITAL_COMMUNITY)
Admission: EM | Admit: 2020-12-17 | Discharge: 2020-12-17 | Disposition: A | Discharge status: Home / Self Care | Payer: Medicaid Other | Attending: Emergency Medicine | Admitting: Emergency Medicine

## 2020-12-17 ENCOUNTER — Other Ambulatory Visit: Payer: Self-pay

## 2020-12-17 DIAGNOSIS — N739 Female pelvic inflammatory disease, unspecified: Secondary | ICD-10-CM | POA: Insufficient documentation

## 2020-12-17 DIAGNOSIS — Z91010 Allergy to peanuts: Secondary | ICD-10-CM | POA: Insufficient documentation

## 2020-12-17 DIAGNOSIS — E119 Type 2 diabetes mellitus without complications: Secondary | ICD-10-CM | POA: Insufficient documentation

## 2020-12-17 DIAGNOSIS — D72829 Elevated white blood cell count, unspecified: Secondary | ICD-10-CM | POA: Insufficient documentation

## 2020-12-17 DIAGNOSIS — F17210 Nicotine dependence, cigarettes, uncomplicated: Secondary | ICD-10-CM | POA: Insufficient documentation

## 2020-12-17 DIAGNOSIS — N730 Acute parametritis and pelvic cellulitis: Secondary | ICD-10-CM

## 2020-12-17 DIAGNOSIS — R300 Dysuria: Secondary | ICD-10-CM

## 2020-12-17 LAB — CBC WITH DIFFERENTIAL/PLATELET
Abs Immature Granulocytes: 0.05 10*3/uL (ref 0.00–0.07)
Basophils Absolute: 0 10*3/uL (ref 0.0–0.1)
Basophils Relative: 0 %
Eosinophils Absolute: 0.2 10*3/uL (ref 0.0–0.5)
Eosinophils Relative: 1 %
HCT: 41.2 % (ref 36.0–46.0)
Hemoglobin: 13.3 g/dL (ref 12.0–15.0)
Immature Granulocytes: 0 %
Lymphocytes Relative: 22 %
Lymphs Abs: 3 10*3/uL (ref 0.7–4.0)
MCH: 24.1 pg — ABNORMAL LOW (ref 26.0–34.0)
MCHC: 32.3 g/dL (ref 30.0–36.0)
MCV: 74.8 fL — ABNORMAL LOW (ref 80.0–100.0)
Monocytes Absolute: 1 10*3/uL (ref 0.1–1.0)
Monocytes Relative: 7 %
Neutro Abs: 9.6 10*3/uL — ABNORMAL HIGH (ref 1.7–7.7)
Neutrophils Relative %: 70 %
Platelets: 247 10*3/uL (ref 150–400)
RBC: 5.51 MIL/uL — ABNORMAL HIGH (ref 3.87–5.11)
RDW: 15.9 % — ABNORMAL HIGH (ref 11.5–15.5)
WBC: 13.9 10*3/uL — ABNORMAL HIGH (ref 4.0–10.5)
nRBC: 0 % (ref 0.0–0.2)

## 2020-12-17 LAB — BASIC METABOLIC PANEL
Anion gap: 9 (ref 5–15)
BUN: 11 mg/dL (ref 6–20)
CO2: 28 mmol/L (ref 22–32)
Calcium: 9.1 mg/dL (ref 8.9–10.3)
Chloride: 100 mmol/L (ref 98–111)
Creatinine, Ser: 0.96 mg/dL (ref 0.44–1.00)
GFR, Estimated: >60 mL/min (ref >60)
Glucose, Bld: 79 mg/dL (ref 70–99)
Potassium: 3.6 mmol/L (ref 3.5–5.1)
Sodium: 137 mmol/L (ref 135–145)

## 2020-12-17 LAB — URINALYSIS, ROUTINE W REFLEX MICROSCOPIC
Bilirubin Urine: NEGATIVE
Glucose, UA: NEGATIVE mg/dL
Ketones, ur: NEGATIVE mg/dL
Nitrite: NEGATIVE
Protein, ur: NEGATIVE mg/dL
Specific Gravity, Urine: 1.019 (ref 1.005–1.030)
pH: 5 (ref 5.0–8.0)

## 2020-12-17 LAB — WET PREP, GENITAL
Clue Cells Wet Prep HPF POC: NONE SEEN
Sperm: NONE SEEN
Trich, Wet Prep: NONE SEEN
WBC, Wet Prep HPF POC: <10 — AB
Yeast Wet Prep HPF POC: NONE SEEN

## 2020-12-17 LAB — PREGNANCY, URINE: Preg Test, Ur: NEGATIVE

## 2020-12-17 MED ORDER — DOXYCYCLINE HYCLATE 100 MG PO TABS
100.0000 mg | ORAL_TABLET | Freq: Once | ORAL | Status: AC
  Administered 2020-12-17: 100 mg via ORAL
  Filled 2020-12-17: qty 1

## 2020-12-17 MED ORDER — CEFTRIAXONE SODIUM 500 MG IJ SOLR
500.0000 mg | Freq: Once | INTRAMUSCULAR | Status: AC
  Administered 2020-12-17: 500 mg via INTRAMUSCULAR
  Filled 2020-12-17: qty 500

## 2020-12-17 MED ORDER — DOXYCYCLINE HYCLATE 100 MG PO CAPS: 100.0000 mg | ORAL_CAPSULE | Freq: Two times a day (BID) | ORAL | 0 refills | Status: DC

## 2020-12-17 MED ORDER — LIDOCAINE HCL (PF) 1 % IJ SOLN
INTRAMUSCULAR | Status: AC
  Filled 2020-12-17: qty 5

## 2020-12-17 NOTE — ED Provider Notes (Signed)
[facility] EMERGENCY DEPARTMENT Provider Note   CSN: [id number] Arrival date & time: 12/17/20  1441     History Chief Complaint  Patient presents with   Vaginal Bleeding    Lindsay James is a 30 y.o. female.   Vaginal Bleeding Associated symptoms: abdominal pain and dysuria   Associated symptoms: no back pain, no dizziness, no fever and no nausea        Lindsay James is a 30 y.o. female who presents to the Emergency Department complaining of pain with urination and a single episode of vaginal bleeding earlier today.  She states that she was at work around noon when she developed crampy lower abdominal pain and noticed some slight vaginal bleeding that spontaneously resolved.  She also describes having pain associated with urination.  She states that she has had abnormal vaginal bleeding last month and her period ended 1 week ago.  Pain to her lower abdomen and is nonradiating.  She denies any fever, chills, flank pain, nausea, vomiting or diarrhea.  No abnormal vaginal discharge or new sexual partners.  States that she was concerned she may be having a miscarriage.   Past Medical History:  Diagnosis Date   Chlamydia    Diabetes mellitus without complication (HCC)    Gonorrhea    High cholesterol    Migraine     Patient Active Problem List   Diagnosis Date Noted   Acute appendicitis    [redacted] weeks gestation of pregnancy    Cerebral ventriculomegaly of fetus    Cervical insufficiency during pregnancy in second trimester, antepartum    [redacted] weeks gestation of pregnancy     Past Surgical History:  Procedure Laterality Date   LAPAROSCOPIC APPENDECTOMY N/A 01/18/2020   Procedure: APPENDECTOMY LAPAROSCOPIC;  Surgeon: Franky Macho, MD;  Location: [facility];  Service: General;  Laterality: N/A;     OB History     Gravida  3   Para  1   Term  1   Preterm      AB  1   Living  1      SAB      IAB      Ectopic      Multiple      Live Births  1            Family History  Problem Relation Age of Onset   Diabetes Maternal Grandmother    Arthritis Mother    Cancer Father     Social History   Tobacco Use   Smoking status: Some Days    Packs/day: 0.50    Types: Cigarettes, Cigars   Smokeless tobacco: Never   Tobacco comments:    1 black & mild daily.  stopped cigarettes  nov 2021  Vaping Use   Vaping Use: Never used  Substance Use Topics   Alcohol use: Yes    Comment: socially   Drug use: Yes    Types: Marijuana    Home Medications Prior to Admission medications   Medication Sig Start Date End Date Taking? Authorizing Provider  acetaminophen (TYLENOL) 325 MG tablet Take 650 mg by mouth every 6 (six) hours as needed. Patient not taking: Reported on 11/03/2020    [provider]  HYDROcodone-acetaminophen (NORCO) 5-325 MG tablet Take 1-2 tablets by mouth every 4 (four) hours as needed for moderate pain. Patient not taking: Reported on 11/03/2020 01/18/20   Franky Macho, MD  meloxicam (MOBIC) 7.5 MG tablet Take 1 tablet (7.5 mg total)  by mouth daily as needed for pain. 11/03/20   Jacquelin Hawking, PA-C  predniSONE (DELTASONE) 50 MG tablet One tablet a day for 5 days 11/22/20   Elson Areas, PA-C  promethazine (PHENERGAN) 25 MG tablet Take 1 tablet (25 mg total) by mouth every 6 (six) hours as needed for nausea or vomiting. 07/22/19 10/28/19  Gilda Crease, MD    Allergies    Peanut-containing drug products  Review of Systems   Review of Systems  Constitutional:  Negative for appetite change, chills and fever.  Respiratory:  Negative for shortness of breath.   Cardiovascular:  Negative for chest pain.  Gastrointestinal:  Positive for abdominal pain. Negative for constipation, diarrhea, nausea and vomiting.  Genitourinary:  Positive for dysuria, menstrual problem, pelvic pain and vaginal bleeding.  Musculoskeletal:  Negative for back pain.  Skin:  Negative for color change and rash.  Neurological:  Negative  for dizziness, weakness and headaches.   Physical Exam Updated Vital Signs BP 115/65   Pulse 70   Temp 98.1 F (36.7 C)   Resp 18   Ht 5\' 4"  (1.626 m)   Wt 124.7 kg   LMP 12/06/2020 (Approximate)   SpO2 100%   BMI 47.20 kg/m   Physical Exam Vitals and nursing note reviewed. Exam conducted with a chaperone present.  Constitutional:      General: She is not in acute distress.    Appearance: Normal appearance. She is obese. She is not toxic-appearing.  Cardiovascular:     Rate and Rhythm: Normal rate and regular rhythm.     Pulses: Normal pulses.  Pulmonary:     Effort: Pulmonary effort is normal.  Abdominal:     General: There is no distension.     Palpations: Abdomen is soft.     Tenderness: There is abdominal tenderness. There is no right CVA tenderness or left CVA tenderness.     Comments: Patient with mild suprapubic tenderness.  Remaining abdomen is soft and nontender.  No CVA tenderness.  Genitourinary:    Labia:        Right: No rash.        Left: No rash.      Cervix: No discharge or cervical bleeding.     Adnexa:        Right: No mass or tenderness.         Left: No mass or tenderness.       Comments: Pelvic exam performed by me.  No bleeding from the cervix and no blood in the vaginal vault.  Exam is somewhat limited due to body habitus, but no palpable adnexal masses or adnexal tenderness.  There is some cervical motion tenderness on exam.  Milky discharge is also present.   ED Results / Procedures / Treatments   Labs (all labs ordered are listed, but only abnormal results are displayed) Labs Reviewed  WET PREP, GENITAL - Abnormal; Notable for the following components:      Result Value   WBC, Wet Prep HPF POC <10 (*)    All other components within normal limits  URINALYSIS, ROUTINE W REFLEX MICROSCOPIC - Abnormal; Notable for the following components:   APPearance HAZY (*)    Hgb urine dipstick MODERATE (*)    Leukocytes,Ua SMALL (*)    Bacteria, UA  RARE (*)    All other components within normal limits  CBC WITH DIFFERENTIAL/PLATELET - Abnormal; Notable for the following components:   WBC 13.9 (*)    RBC 5.51 (*)  MCV 74.8 (*)    MCH 24.1 (*)    RDW 15.9 (*)    Neutro Abs 9.6 (*)    All other components within normal limits  URINE CULTURE  PREGNANCY, URINE  BASIC METABOLIC PANEL  GC/CHLAMYDIA PROBE AMP ([place]) NOT AT [facility]    EKG None  Radiology No results found.  Procedures Procedures   Medications Ordered in ED Medications - No data to display  ED Course  I have reviewed the triage vital signs and the nursing notes.  Pertinent labs & imaging results that were available during my care of the patient were reviewed by me and considered in my medical decision making (see chart for details).    MDM Rules/Calculators/A&P                           Patient here with reports of single episode of vaginal bleeding earlier today and pain with urination.  She is here concerned that she may be having a miscarriage.  Describes having crampy lower abdominal pain that is nonradiating.  No associated fever, chills, nausea or vomiting.  On exam patient is well-appearing, nontoxic.  Very mild suprapubic tenderness on exam.  Pelvic exam reveals some cervical motion tenderness and vaginal discharge without palpable adnexal tenderness or masses.  Urine pregnancy test is negative.  No electrolyte derangement.  Patient has leukocytosis with white count of 13,000.  Has history of elevated white count from prior labs.  Urinalysis shows moderate blood with small amount of leukocytes and 11-20 white cells and rare bacteria.  Urine culture, GC chlamydia culture pending. Wet prep without significant findings.  Patient reports having dysuria symptoms this could be developing UTI although her exam she does have some cervical motion tenderness and vaginal discharge.  I believe her symptoms may be more related to developing PID.  We will treat  here with doxycycline and Rocephin and prescription given Doxycycline.  Her urine pregnancy test was negative, no significant adnexal tenderness on exam.  Low clinical suspicion for TOA or torsion.  Patient appears appropriate for discharge home, she will follow-up with her OB/GYN next week.  Return precautions were discussed.    Final Clinical Impression(s) / ED Diagnoses Final diagnoses:  Dysuria  PID (acute pelvic inflammatory disease)    Rx / DC Orders ED Discharge Orders     None        Rosey Bath 12/17/20 2331    Eber Hong, MD 12/19/20 1754

## 2020-12-17 NOTE — ED Triage Notes (Signed)
Patient c/o light vaginal bleeding with pelvic pressure. Patient denies any nausea, vomiting, fevers, or diarrhea. Per patient feeling of needing to void but unable. Patient reports having her normal period x1 week ago. Last BM today-normal, no blood noted.

## 2020-12-17 NOTE — Discharge Instructions (Signed)
Please take the antibiotic as directed until its finished.  Your culture results are pending.  You will be notified of any positive results.  You may also review your results on the MyChart app.  Follow-up with your primary care provider or with your OB/GYN for recheck.  Return emergency department for any new or worsening symptoms.

## 2020-12-19 LAB — URINE CULTURE

## 2020-12-19 LAB — GC/CHLAMYDIA PROBE AMP (~~LOC~~) NOT AT ARMC
Chlamydia: NEGATIVE
Comment: NEGATIVE
Comment: NORMAL
Neisseria Gonorrhea: NEGATIVE

## 2021-01-10 ENCOUNTER — Encounter: Payer: Medicaid Other | Attending: Physician Assistant | Admitting: Nutrition

## 2021-01-10 ENCOUNTER — Other Ambulatory Visit: Payer: Self-pay

## 2021-01-10 ENCOUNTER — Encounter: Payer: Self-pay | Admitting: Nutrition

## 2021-01-10 DIAGNOSIS — Z6841 Body Mass Index (BMI) 40.0 and over, adult: Secondary | ICD-10-CM | POA: Insufficient documentation

## 2021-01-10 NOTE — Progress Notes (Signed)
Medical Nutrition Therapy  Appointment Start time:  0800  Appointment End time:  0900  Primary concerns today: Morbid Obesity  Referral diagnosis: E66.01 Preferred learning style: No pref  Learning readiness: Ready    NUTRITION ASSESSMENT  She is wanting to lose weight. Is trying to work 2 jobs and is limited on time to prepare healthier foods. Complains of a lot of inflammatory pain, arthritis. Motivated to make some changes with her diet and get back to eating healthier foods and start meal planning better. Current diet is insuffient to meet her needs for on going weight loss due to empty calories and calorie dense foods and not exercising. Limited financially which adds further stress to her life. Wants to find better housing for her and her son.  Goes [facility]. Doesn't have insurance currently, but is signing up for it in the next month through her job. High risk for Type 2 DM, metabolic syndrome and HTN.  Anthropometrics  Wt Readings from Last 3 Encounters:  01/10/21 288 lb (130.6 kg)  12/17/20 275 lb (124.7 kg)  11/22/20 288 lb (130.6 kg)   Ht Readings from Last 3 Encounters:  01/10/21 5\' 4"  (1.626 m)  12/17/20 5\' 4"  (1.626 m)  11/22/20 5\' 4"  (1.626 m)   Body mass index is 49.44 kg/m. @BMIFA @ Facility age limit for growth percentiles is 20 years. Facility age limit for growth percentiles is 20 years.    Clinical Medical Hx: Obesity Medications: see chart  Labs:  Lab Results  Component Value Date   HGBA1C 5.4 05/19/2020    Notable Signs/Symptoms: Fatigue, no energy, increased thirsty,   Lifestyle & Dietary Hx Lives with her ex partner and your son. Working FT at 01/22/21. Her mom takes care of son when she works. 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  Estimated daily fluid intake: 2 bottles of water oz Supplements: MVI,  Sleep: 5-6 Stress / self-care: financial issues Current average weekly physical activity: Walks up and down her street 2 times per  week.  24-Hr Dietary Recall First Meal: Egg sandwich Snack:  Second Meal: piece of chicken [place], Juice -lemonade or hawaiian punch or water Snack: water Third Meal: Salad with stomboli, water Snack:  Beverages: water 2 bottles, 3 cups of hawwain punch  Estimated Energy Needs Calories: 1200 Carbohydrate: 135g Protein: 90g Fat: 33g   NUTRITION DIAGNOSIS  NI-1.5 Excessive energy intake As related to high calorie high fat diet.  As evidenced by BMI 49.   NUTRITION INTERVENTION  Nutrition education (E-1) on the following topics:  Nutrition and Pre- Diabetes education provided on My Plate, CHO counting, meal planning, portion sizes, timing of meals, avoiding snacks between meals unless having a low blood sugar, target ranges for A1C and blood sugars, signs/symptoms and treatment of hyper/hypoglycemia, monitoring blood sugars, taking medications as prescribed, benefits of exercising 30 minutes per day and prevention of complications of DM. Weight loss tips, emotional eating, benefits of exercises, budgeting and smart shopping to stretch food budget  Handouts Provided Include  MY Plate Meal Plan Card Weight loss tips  Learning Style & Readiness for Change Teaching method utilized: Visual & Auditory  Demonstrated degree of understanding via: Teach Back  Barriers to learning/adherence to lifestyle change: none  Goals Established by Pt Goals  Follow MY Plate Increase lower carb vegetables Increase walking for 30 minutes or more three times per week. Cut out fruit juice and lemonade and drink only water .Lose 1 lb per week. Cut out processed foods.   MONITORING &  EVALUATION Dietary intake, weekly physical activity, and weight  in 1 month.  Next Steps  Patient is to work on meal planning and cooking foods at home.Marland Kitchen

## 2021-01-10 NOTE — Patient Instructions (Addendum)
Goals  Follow MY Plate Increase lower carb vegetables Increase walking for 30 minutes or more three times per week. Cut out fruit juice and lemonade and drink only water Lose 1 lb per week. Cut out processed foods.

## 2021-02-01 ENCOUNTER — Ambulatory Visit
Admission: EM | Admit: 2021-02-01 | Discharge: 2021-02-01 | Disposition: A | Discharge status: Home / Self Care | Payer: Medicaid Other | Attending: Family Medicine | Admitting: Family Medicine

## 2021-02-01 ENCOUNTER — Other Ambulatory Visit: Payer: Self-pay

## 2021-02-01 ENCOUNTER — Encounter: Payer: Self-pay | Admitting: Emergency Medicine

## 2021-02-01 DIAGNOSIS — J069 Acute upper respiratory infection, unspecified: Secondary | ICD-10-CM

## 2021-02-01 MED ORDER — BENZONATATE 100 MG PO CAPS: ORAL_CAPSULE | ORAL | 0 refills | Status: DC

## 2021-02-01 NOTE — ED Provider Notes (Signed)
[facility] CARE CENTER   [id number] 02/01/21 Arrival Time: 1237  ASSESSMENT & PLAN:  1. Viral URI with cough    Discussed typical duration of viral illnesses. COVID-19/influenza testing sent. OTC symptom care as needed. Work note provided.  Meds ordered this encounter  Medications   benzonatate (TESSALON) 100 MG capsule    Sig: Take 1 capsule by mouth every 8 (eight) hours for cough.    Dispense:  21 capsule    Refill:  0     Follow-up Information     [facility] at [facility].   Specialty: Urgent Care Why: As needed. Contact information: [address] [place] [address] [phone]                Reviewed expectations re: course of current medical issues. Questions answered. Outlined signs and symptoms indicating need for more acute intervention. Understanding verbalized. After Visit Summary given.   SUBJECTIVE: History from: patient. Lindsay James is a 30 y.o. female who reports: cough, ST, body aches; x 2-3 days; exposure to COVID/flu at work. Denies: fever and difficulty breathing. Normal PO intake without n/v/d.   OBJECTIVE:  Vitals:   02/01/21 1427  BP: 119/76  Pulse: 60  Resp: 16  Temp: 98.7 F (37.1 C)  TempSrc: Oral  SpO2: 98%    General appearance: alert; no distress Eyes: PERRLA; EOMI; conjunctiva normal HENT: [place]; AT; with nasal congestion; throat with mild erythema Neck: supple  Lungs: speaks full sentences without difficulty; unlabored Extremities: no edema Skin: warm and dry Neurologic: normal gait Psychological: alert and cooperative; normal mood and affect  Labs:  Labs Reviewed  COVID-19, FLU A+B NAA    Allergies  Allergen Reactions   Peanut-Containing Drug Products Rash    Past Medical History:  Diagnosis Date   Chlamydia    Diabetes mellitus without complication (HCC)    Gonorrhea    High cholesterol    Migraine    Social History   Socioeconomic History    Marital status: Single    Spouse name: Not on file   Number of children: Not on file   Years of education: Not on file   Highest education level: Not on file  Occupational History   Not on file  Tobacco Use   Smoking status: Some Days    Packs/day: 0.50    Types: Cigarettes, Cigars   Smokeless tobacco: Never   Tobacco comments:    1 black & mild daily.  stopped cigarettes  nov 2021  Vaping Use   Vaping Use: Never used  Substance and Sexual Activity   Alcohol use: Yes    Comment: socially   Drug use: Yes    Types: Marijuana   Sexual activity: Yes    Birth control/protection: None  Other Topics Concern   Not on file  Social History Narrative   Not on file   Social Determinants of Health   Financial Resource Strain: Not on file  Food Insecurity: Food Insecurity Present   Worried About Running Out of Food in the Last Year: Sometimes true   Ran Out of Food in the Last Year: Sometimes true  Transportation Needs: No Transportation Needs   Lack of Transportation (Medical): No   Lack of Transportation (Non-Medical): No  Physical Activity: Not on file  Stress: Not on file  Social Connections: Not on file  Intimate Partner Violence: Not on file   Family History  Problem Relation Age of Onset   Diabetes Maternal Grandmother  Arthritis Mother    Cancer Father    Past Surgical History:  Procedure Laterality Date   LAPAROSCOPIC APPENDECTOMY N/A 01/18/2020   Procedure: APPENDECTOMY LAPAROSCOPIC;  Surgeon: Franky Macho, MD;  Location: [facility];  Service: General;  Laterality: Vertis Kelch, MD 02/01/21 1528

## 2021-02-01 NOTE — ED Triage Notes (Signed)
Reports cough, sore throat, aches for 2-3 days. Exposed to covid and flu

## 2021-02-02 LAB — COVID-19, FLU A+B NAA
Influenza A, NAA: NOT DETECTED
Influenza B, NAA: NOT DETECTED
SARS-CoV-2, NAA: NOT DETECTED

## 2021-02-13 ENCOUNTER — Encounter (HOSPITAL_COMMUNITY): Payer: Self-pay | Admitting: Emergency Medicine

## 2021-02-13 ENCOUNTER — Emergency Department (HOSPITAL_COMMUNITY)
Admission: EM | Admit: 2021-02-13 | Discharge: 2021-02-13 | Disposition: A | Discharge status: Home / Self Care | Payer: Medicaid Other | Attending: Emergency Medicine | Admitting: Emergency Medicine

## 2021-02-13 ENCOUNTER — Other Ambulatory Visit: Payer: Self-pay

## 2021-02-13 DIAGNOSIS — J101 Influenza due to other identified influenza virus with other respiratory manifestations: Secondary | ICD-10-CM | POA: Insufficient documentation

## 2021-02-13 DIAGNOSIS — E119 Type 2 diabetes mellitus without complications: Secondary | ICD-10-CM | POA: Insufficient documentation

## 2021-02-13 DIAGNOSIS — Z20822 Contact with and (suspected) exposure to covid-19: Secondary | ICD-10-CM | POA: Insufficient documentation

## 2021-02-13 DIAGNOSIS — Z91010 Allergy to peanuts: Secondary | ICD-10-CM | POA: Insufficient documentation

## 2021-02-13 DIAGNOSIS — F17210 Nicotine dependence, cigarettes, uncomplicated: Secondary | ICD-10-CM | POA: Insufficient documentation

## 2021-02-13 LAB — RESP PANEL BY RT-PCR (FLU A&B, COVID) ARPGX2
Influenza A by PCR: POSITIVE — AB
Influenza B by PCR: NEGATIVE
SARS Coronavirus 2 by RT PCR: NEGATIVE

## 2021-02-13 NOTE — Discharge Instructions (Addendum)
Return if any problems.

## 2021-02-13 NOTE — ED Triage Notes (Signed)
Pt here from home with c/o congestion body aches and fevers

## 2021-02-13 NOTE — ED Provider Notes (Signed)
[facility] EMERGENCY DEPARTMENT Provider Note   CSN: [id number] Arrival date & time: 02/13/21  1047     History Chief Complaint  Patient presents with   Nasal Congestion   Cough    Lindsay James is a 30 y.o. female.  The history is provided by the patient. No language interpreter was used.  Cough Cough characteristics:  Non-productive Sputum characteristics:  Nondescript Severity:  Moderate Onset quality:  Gradual Timing:  Constant Progression:  Worsening Chronicity:  New Smoker: no   Context: sick contacts and upper respiratory infection   Relieved by:  Nothing Worsened by:  Nothing Ineffective treatments:  None tried Associated symptoms: fever and myalgias       Past Medical History:  Diagnosis Date   Chlamydia    Diabetes mellitus without complication (HCC)    Gonorrhea    High cholesterol    Migraine     Patient Active Problem List   Diagnosis Date Noted   Acute appendicitis    [redacted] weeks gestation of pregnancy    Cerebral ventriculomegaly of fetus    Cervical insufficiency during pregnancy in second trimester, antepartum    [redacted] weeks gestation of pregnancy     Past Surgical History:  Procedure Laterality Date   LAPAROSCOPIC APPENDECTOMY N/A 01/18/2020   Procedure: APPENDECTOMY LAPAROSCOPIC;  Surgeon: Franky Macho, MD;  Location: [facility] OR[facility];  Service: General;  Laterality: N/A;     OB History     Gravida  3   Para  1   Term  1   Preterm      AB  1   Living  1      SAB      IAB      Ectopic      Multiple      Live Births  1           Family History  Problem Relation Age of Onset   Diabetes Maternal Grandmother    Arthritis Mother    Cancer Father     Social History   Tobacco Use   Smoking status: Some Days    Packs/day: 0.50    Types: Cigarettes, Cigars   Smokeless tobacco: Never   Tobacco comments:    1 black & mild daily.  stopped cigarettes  nov 2021  Vaping Use   Vaping Use: Never used  Substance Use  Topics   Alcohol use: Yes    Comment: socially   Drug use: Yes    Types: Marijuana    Home Medications Prior to Admission medications   Medication Sig Start Date End Date Taking? Authorizing Provider  acetaminophen (TYLENOL) 325 MG tablet Take 650 mg by mouth every 6 (six) hours as needed. Patient not taking: No sig reported    [provider]  benzonatate (TESSALON) 100 MG capsule Take 1 capsule by mouth every 8 (eight) hours for cough. 02/01/21   Mardella Layman, MD  doxycycline (VIBRAMYCIN) 100 MG capsule Take 1 capsule (100 mg total) by mouth 2 (two) times daily. 12/17/20   Triplett, Tammy, PA-C  HYDROcodone-acetaminophen (NORCO) 5-325 MG tablet Take 1-2 tablets by mouth every 4 (four) hours as needed for moderate pain. Patient not taking: No sig reported 01/18/20   Franky Macho, MD  meloxicam (MOBIC) 7.5 MG tablet Take 1 tablet (7.5 mg total) by mouth daily as needed for pain. 11/03/20   Jacquelin Hawking, PA-C  predniSONE (DELTASONE) 50 MG tablet One tablet a day for 5 days 11/22/20   Cheron Schaumann  K, PA-C  promethazine (PHENERGAN) 25 MG tablet Take 1 tablet (25 mg total) by mouth every 6 (six) hours as needed for nausea or vomiting. 07/22/19 10/28/19  Gilda Crease, MD    Allergies    Peanut-containing drug products  Review of Systems   Review of Systems  Constitutional:  Positive for fever.  Respiratory:  Positive for cough.   Musculoskeletal:  Positive for myalgias.  All other systems reviewed and are negative.  Physical Exam Updated Vital Signs BP 107/67 (BP Location: Right Arm)   Pulse 88   Temp 99.3 F (37.4 C) (Oral)   Resp 20   SpO2 96%   Physical Exam Vitals and nursing note reviewed.  Constitutional:      Appearance: She is well-developed.  HENT:     Head: Normocephalic.     Right Ear: Tympanic membrane normal.     Left Ear: Tympanic membrane normal.     Nose: Nose normal.     Mouth/Throat:     Mouth: Mucous membranes are moist.  Eyes:      Extraocular Movements: Extraocular movements intact.     Pupils: Pupils are equal, round, and reactive to light.  Cardiovascular:     Rate and Rhythm: Normal rate and regular rhythm.  Pulmonary:     Effort: Pulmonary effort is normal.  Abdominal:     General: There is no distension.  Musculoskeletal:        General: Normal range of motion.     Cervical back: Normal range of motion.  Skin:    General: Skin is warm.  Neurological:     Mental Status: She is alert and oriented to person, place, and time.    ED Results / Procedures / Treatments   Labs (all labs ordered are listed, but only abnormal results are displayed) Labs Reviewed  RESP PANEL BY RT-PCR (FLU A&B, COVID) ARPGX2 - Abnormal; Notable for the following components:      Result Value   Influenza A by PCR POSITIVE (*)    All other components within normal limits    EKG None  Radiology No results found.  Procedures Procedures   Medications Ordered in ED Medications - No data to display  ED Course  I have reviewed the triage vital signs and the nursing notes.  Pertinent labs & imaging results that were available during my care of the patient were reviewed by me and considered in my medical decision making (see chart for details).    MDM Rules/Calculators/A&P                           MDM:  Influenza A positive   Pt advised tylenol or ibuprofen  Final Clinical Impression(s) / ED Diagnoses Final diagnoses:  Influenza A    Rx / DC Orders ED Discharge Orders     None     An After Visit Summary was printed and given to the patient.    [name] 02/13/21 1240    Benjiman Core, MD 02/14/21 [phone]

## 2021-02-15 ENCOUNTER — Ambulatory Visit: Payer: Medicaid Other | Admitting: Nutrition

## 2021-03-19 NOTE — L&D Delivery Note (Signed)
OB/GYN Faculty Practice Delivery Note  Lindsay James is a 31 y.o. G3P1011 s/p VD at [redacted]w[redacted]d. She was admitted for SOL.   ROM: 3h 20m with clear, bloody fluid GBS Status:  Negative/-- (11/30 1400) Maximum Maternal Temperature: 98.6  Labor Progress: Initial SVE: 6/100/-2. She then progressed to complete.   Delivery Date/Time: 03/03/2022 @1751  Delivery: Called to room and patient was complete and pushing. Head delivered ROA. No nuchal cord present. Shoulder and body delivered in usual fashion. Infant with spontaneous cry, placed on mother's abdomen, dried and stimulated. Cord clamped x 2 after 1-minute delay, and cut by mother of the patient. Cord blood drawn. Umbilical cord noted to have a true knot. Placenta delivered spontaneously with gentle cord traction. Fundus firm with massage and Pitocin. Labia, perineum, vagina, and cervix inspected with right periurethral and perineal laceration that was repaired due to continued brisk bleeding.  Baby Weight: pending  Placenta: 3 vessel, intact. Sent to L&D Complications: None Lacerations: perineal, periurethral repaired with 3.0 vicryl, 4.0 Monocryl respectively EBL: 27 mL Analgesia: Maternally supported, lidocaine for repair   Infant:  APGAR (1 MIN): 9   APGAR (5 MINS): 9    Lindsay Karr Autry-Lott, DO OB Fellow, Faculty , [facility] 03/03/2022, 9:23 PM

## 2021-03-23 ENCOUNTER — Other Ambulatory Visit: Payer: Self-pay

## 2021-03-23 ENCOUNTER — Encounter: Payer: No Typology Code available for payment source | Attending: Physician Assistant | Admitting: Nutrition

## 2021-03-23 ENCOUNTER — Encounter: Payer: Self-pay | Admitting: Nutrition

## 2021-03-23 VITALS — Ht 64.0 in | Wt 287.4 lb

## 2021-03-23 DIAGNOSIS — Z6841 Body Mass Index (BMI) 40.0 and over, adult: Secondary | ICD-10-CM | POA: Insufficient documentation

## 2021-03-23 NOTE — Patient Instructions (Addendum)
Goals  Do utube chair exercises 4 times a week. Lindsay James Work on [employer] out snacks and processed foods Drink 6- 16 0z bottles of water per day Cut out all juices Use TRUE LEMON in your water Look up new recipes on utube for ideas Lose 1 lb per week.  Lifestyle Medicine - Whole Food, Plant Predominant Nutrition is highly recommended: Eat Plenty of vegetables, Mushrooms, fruits, Legumes, Whole Grains, Nuts, seeds in lieu of processed meats, processed snacks/pastries red meat, poultry, eggs.    -It is better to avoid simple carbohydrates including: Cakes, Sweet Desserts, Ice Cream, Soda (diet and regular), Sweet Tea, Candies, Chips, Cookies, Store Bought Juices, Alcohol in Excess of  1-2 drinks a day, Lemonade,  Artificial Sweeteners, Doughnuts, Coffee Creamers, "Sugar-free" Products, etc, etc.  This is not a complete list.....  Exercise: If you are able: 30 -60 minutes a day ,4 days a week, or 150 minutes a week.  The longer the better.  Combine stretch, strength, and aerobic activities.  If you were told in the past that you have high risk for cardiovascular diseases, you may seek evaluation by your heart doctor prior to initiating moderate to intense exercise programs

## 2021-03-23 NOTE — Progress Notes (Signed)
Medical Nutrition Therapy Follow up Appointment Start time:  [phone] Appointment End time:  0930 Primary concerns today: Morbid Obesity  Referral diagnosis: E66.01 Preferred learning style: No pref  Learning readiness: Ready    NUTRITION ASSESSMENT  follow up No significant weight loss. Admits to slipping during the holidays. Not exercising. Drinking some water but changed from drinking tea to juice. Educated on need for just water. Now is a Production designer, theatre/television/film at [employer].  Anthropometrics  Wt Readings from Last 3 Encounters:  03/23/21 287 lb 6.4 oz (130.4 kg)  01/10/21 288 lb (130.6 kg)  12/17/20 275 lb (124.7 kg)   Ht Readings from Last 3 Encounters:  03/23/21 5\' 4"  (1.626 m)  01/10/21 5\' 4"  (1.626 m)  12/17/20 5\' 4"  (1.626 m)   Body mass index is 49.33 kg/m. @BMIFA @ Facility age limit for growth percentiles is 20 years. Facility age limit for growth percentiles is 20 years.    Clinical Medical Hx: Obesity Medications: see chart  Labs:  Lab Results  Component Value Date   HGBA1C 5.4 05/19/2020    Notable Signs/Symptoms: Fatigue, no energy, increased thirsty,   Lifestyle & Dietary Hx Lives with her ex partner and your son. Working FT at 02/16/21. Her mom takes care of son when she works. 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  Estimated daily fluid intake: 2 bottles of water oz Supplements: MVI,  Sleep: 5-6 Stress / self-care: financial issues Current average weekly physical activity: Walks up and down her street 2 times per week.  24-Hr Dietary Recall First Meal: Egg sandwich Snack:  Second Meal: piece of chicken [place], Juice -lemonade or hawaiian punch or water Snack: water Third Meal: Salad with stomboli, water Snack:  Beverages: water 2 bottles, 3 cups of hawwain punch  Estimated Energy Needs Calories: 1200 Carbohydrate: 135g Protein: 90g Fat: 33g   NUTRITION DIAGNOSIS  NI-1.5 Excessive energy intake As related to high calorie high fat diet.  As evidenced by BMI  49.   NUTRITION INTERVENTION  Nutrition education (E-1) on the following topics:  Lifestyle Medicine - Whole Food, Plant Predominant Nutrition is highly recommended: Eat Plenty of vegetables, Mushrooms, fruits, Legumes, Whole Grains, Nuts, seeds in lieu of processed meats, processed snacks/pastries red meat, poultry, eggs.    -It is better to avoid simple carbohydrates including: Cakes, Sweet Desserts, Ice Cream, Soda (diet and regular), Sweet Tea, Candies, Chips, Cookies, Store Bought Juices, Alcohol in Excess of  1-2 drinks a day, Lemonade,  Artificial Sweeteners, Doughnuts, Coffee Creamers, "Sugar-free" Products, etc, etc.  This is not a complete list.....  Exercise: If you are able: 30 -60 minutes a day ,4 days a week, or 150 minutes a week.  The longer the better.  Combine stretch, strength, and aerobic activities.  If you were told in the past that you have high risk for cardiovascular diseases, you may seek evaluation by your heart doctor prior to initiating moderate to intense exercise programs  Handouts Provided Include  My plant based meal plan Nutrition lifestyle  Learning Style & Readiness for Change Teaching method utilized: Visual & Auditory  Demonstrated degree of understanding via: Teach Back  Barriers to learning/adherence to lifestyle change: none  Goals Established by Pt Goals  Do utube chair exercises 4 times a week. Work on out snacks and processed foods Drink 6- 16 0z bottles of water per day Cut out all juices Use TRUE LEMON in your water Look up new recipes on utube for ideas Lose 1 lb per week.  Focus on more plant based foods. Lifestyle Medicine - Whole Food, Plant Predominant Nutrition is highly recommended: Eat Plenty of vegetables, Mushrooms, fruits, Legumes, Whole Grains, Nuts, seeds in lieu of processed meats, processed snacks/pastries red meat, poultry, eggs.    -It is better to avoid simple carbohydrates including:  Cakes, Sweet Desserts, Ice Cream, Soda (diet and regular), Sweet Tea, Candies, Chips, Cookies, Store Bought Juices, Alcohol in Excess of  1-2 drinks a day, Lemonade,  Artificial Sweeteners, Doughnuts, Coffee Creamers, "Sugar-free" Products, etc, etc.  This is not a complete list.....  Exercise: If you are able: 30 -60 minutes a day ,4 days a week, or 150 minutes a week.  The longer the better.  Combine stretch, strength, and aerobic activities.  If you were told in the past that you have high risk for cardiovascular diseases, you may seek evaluation by your heart doctor prior to initiating moderate to intense exercise programs  MONITORING & EVALUATION Dietary intake, weekly physical activity, and weight  in 1 month. She would benefit from Ozempic or similar product for needed weight loss and prevention of DM. Next Steps  Patient is to work on meal planning and cooking foods at home.Marland Kitchen

## 2021-04-24 ENCOUNTER — Encounter: Payer: No Typology Code available for payment source | Attending: Physician Assistant | Admitting: Nutrition

## 2021-04-24 ENCOUNTER — Other Ambulatory Visit: Payer: Self-pay

## 2021-04-24 VITALS — Wt 287.2 lb

## 2021-04-24 DIAGNOSIS — Z6841 Body Mass Index (BMI) 40.0 and over, adult: Secondary | ICD-10-CM | POA: Insufficient documentation

## 2021-04-24 NOTE — Patient Instructions (Addendum)
Goals  Cut out sweet tea Drink only water Increase more plant based foods Increase exercise to 150 a week. Lose 2 lbs a month Track foods on [email or web address]

## 2021-04-24 NOTE — Progress Notes (Signed)
Medical Nutrition Therapy Follow up Appointment Start time:  [phone] Appointment End time:  0930 Primary concerns today: Morbid Obesity  Referral diagnosis: E66.01 Preferred learning style: No pref  Learning readiness: Ready    NUTRITION ASSESSMENT  follow up Changes made: Eating more baked foods and then fried foods. Still struggles with sweet tea Drinking more water. Eating more fruit. Has cut out chips.   Anthropometrics  Wt Readings from Last 3 Encounters:  03/23/21 287 lb 6.4 oz (130.4 kg)  01/10/21 288 lb (130.6 kg)  12/17/20 275 lb (124.7 kg)   Ht Readings from Last 3 Encounters:  03/23/21 5\' 4"  (1.626 m)  01/10/21 5\' 4"  (1.626 m)  12/17/20 5\' 4"  (1.626 m)   There is no height or weight on file to calculate BMI. @BMIFA @ Facility age limit for growth percentiles is 20 years. Facility age limit for growth percentiles is 20 years.    Clinical Medical Hx: Obesity Medications: see chart  Labs:  Lab Results  Component Value Date   HGBA1C 5.4 05/19/2020    Notable Signs/Symptoms: Fatigue, no energy, increased thirsty,   Lifestyle & Dietary Hx Lives with her ex partner and your son. Working FT at 02/16/21. Her mom takes care of son when she works. 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  Estimated daily fluid intake: 2 bottles of water oz Supplements: MVI,  Sleep: 5-6 Stress / self-care: financial issues Current average weekly physical activity: Walks up and down her street 2 times per week.  24-Hr Dietary Recall First Meal: 2 eggs and oatmeal,  Snack:  Second Meal: cookout grilled chicken sandwich and grilled chicken wrap and fries. Sweet tea.  Snack: water Third Meal: Chicken squash and zucchini green beans, sweet tea Snack:  Beverages: water 2 bottles,  Estimated Energy Needs Calories: 1200 Carbohydrate: 135g Protein: 90g Fat: 33g   NUTRITION DIAGNOSIS  NI-1.5 Excessive energy intake As related to high calorie high fat diet.  As evidenced by BMI  49.   NUTRITION INTERVENTION  Nutrition education (E-1) on the following topics:  Lifestyle Medicine - Whole Food, Plant Predominant Nutrition is highly recommended: Eat Plenty of vegetables, Mushrooms, fruits, Legumes, Whole Grains, Nuts, seeds in lieu of processed meats, processed snacks/pastries red meat, poultry, eggs.    -It is better to avoid simple carbohydrates including: Cakes, Sweet Desserts, Ice Cream, Soda (diet and regular), Sweet Tea, Candies, Chips, Cookies, Store Bought Juices, Alcohol in Excess of  1-2 drinks a day, Lemonade,  Artificial Sweeteners, Doughnuts, Coffee Creamers, "Sugar-free" Products, etc, etc.  This is not a complete list.....  Exercise: If you are able: 30 -60 minutes a day ,4 days a week, or 150 minutes a week.  The longer the better.  Combine stretch, strength, and aerobic activities.  If you were told in the past that you have high risk for cardiovascular diseases, you may seek evaluation by your heart doctor prior to initiating moderate to intense exercise programs  Handouts Provided Include  My plant based meal plan Nutrition lifestyle  Learning Style & Readiness for Change Teaching method utilized: Visual & Auditory  Demonstrated degree of understanding via: Teach Back  Barriers to learning/adherence to lifestyle change: none  Goals Established by Pt  Cut out sweet tea Drink only water Increase more plant based foods Increase exercise to 150 a week. Lose 2 lbs a month Track foods on  Focus on more plant based foods. Lifestyle Medicine - Whole Food, Plant Predominant Nutrition is highly recommended: Eat Plenty of vegetables, Mushrooms, fruits, Legumes, Whole  Grains, Nuts, seeds in lieu of processed meats, processed snacks/pastries red meat, poultry, eggs.    -It is better to avoid simple carbohydrates including: Cakes, Sweet Desserts, Ice Cream, Soda (diet and regular), Sweet Tea, Candies, Chips, Cookies, Store Bought Juices,  Alcohol in Excess of  1-2 drinks a day, Lemonade,  Artificial Sweeteners, Doughnuts, Coffee Creamers, "Sugar-free" Products, etc, etc.  This is not a complete list.....  Exercise: If you are able: 30 -60 minutes a day ,4 days a week, or 150 minutes a week.  The longer the better.  Combine stretch, strength, and aerobic activities.  If you were told in the past that you have high risk for cardiovascular diseases, you may seek evaluation by your heart doctor prior to initiating moderate to intense exercise programs  MONITORING & EVALUATION Dietary intake, weekly physical activity, and weight  in 1 month. She would benefit from Ozempic or similar product for needed weight loss and prevention of DM. Next Steps  Patient is to work on meal planning and cooking foods at home.Marland Kitchen

## 2021-04-26 ENCOUNTER — Emergency Department (HOSPITAL_COMMUNITY)
Admission: EM | Admit: 2021-04-26 | Discharge: 2021-04-26 | Disposition: A | Discharge status: Home / Self Care | Payer: No Typology Code available for payment source | Attending: Emergency Medicine | Admitting: Emergency Medicine

## 2021-04-26 ENCOUNTER — Other Ambulatory Visit: Payer: Self-pay

## 2021-04-26 ENCOUNTER — Encounter (HOSPITAL_COMMUNITY): Payer: Self-pay | Admitting: *Deleted

## 2021-04-26 DIAGNOSIS — L0291 Cutaneous abscess, unspecified: Secondary | ICD-10-CM

## 2021-04-26 DIAGNOSIS — Z79899 Other long term (current) drug therapy: Secondary | ICD-10-CM | POA: Insufficient documentation

## 2021-04-26 DIAGNOSIS — L0231 Cutaneous abscess of buttock: Secondary | ICD-10-CM | POA: Insufficient documentation

## 2021-04-26 DIAGNOSIS — Z23 Encounter for immunization: Secondary | ICD-10-CM | POA: Insufficient documentation

## 2021-04-26 MED ORDER — LIDOCAINE-EPINEPHRINE (PF) 2 %-1:200000 IJ SOLN
10.0000 mL | Freq: Once | INTRAMUSCULAR | Status: AC
  Administered 2021-04-26: 10 mL
  Filled 2021-04-26: qty 20

## 2021-04-26 MED ORDER — TETANUS-DIPHTH-ACELL PERTUSSIS 5-2.5-18.5 LF-MCG/0.5 IM SUSY
0.5000 mL | PREFILLED_SYRINGE | Freq: Once | INTRAMUSCULAR | Status: AC
Start: 2021-04-26 — End: 2021-04-26
  Administered 2021-04-26: 0.5 mL via INTRAMUSCULAR
  Filled 2021-04-26: qty 0.5

## 2021-04-26 NOTE — ED Triage Notes (Signed)
Pt with abscess to left buttock x 3 days. + HA.  Denies any fever or N/V

## 2021-04-26 NOTE — ED Notes (Signed)
Incision covered with gauze.

## 2021-04-26 NOTE — Discharge Instructions (Signed)
Please soak the wound 2 times daily for next 7 days this will help drop the infection.  Keep the area clean, change on the dressings.  Use over-the-counter pain medication as needed.3  May follow-up with PCP as needed  Please come back  in 2 to 3 days time if symptoms are worsening i.e. worsening pain, worsening redness, increasing in size or worsening swelling.

## 2021-04-26 NOTE — ED Provider Notes (Signed)
[facility] EMERGENCY DEPARTMENT Provider Note   CSN: [id number] Arrival date & time: 04/26/21  1556     History  Chief Complaint  Patient presents with   Abscess    Lindsay James is a 31 y.o. female.  HPI  Patient without Perative medical history presents with complaints of an abscess, states she noticed abscess about 3 days ago, states is on the left anterior aspect of the buttocks, states it has gotten larger in size, more painful, denies any drainage or discharge from the area.  She has had these in the past but it has been a long time, she is not immunocompromise, does not remember last time she had her tetanus shot.  no history of MRSA infection.  She denies any fevers chills general body aches.  Home Medications Prior to Admission medications   Medication Sig Start Date End Date Taking? Authorizing Provider  acetaminophen (TYLENOL) 325 MG tablet Take 650 mg by mouth every 6 (six) hours as needed. Patient not taking: No sig reported    [provider]  benzonatate (TESSALON) 100 MG capsule Take 1 capsule by mouth every 8 (eight) hours for cough. 02/01/21   Vanessa Kick, MD  doxycycline (VIBRAMYCIN) 100 MG capsule Take 1 capsule (100 mg total) by mouth 2 (two) times daily. 12/17/20   Triplett, Tammy, PA-C  HYDROcodone-acetaminophen (NORCO) 5-325 MG tablet Take 1-2 tablets by mouth every 4 (four) hours as needed for moderate pain. Patient not taking: No sig reported 01/18/20   Aviva Signs, MD  meloxicam (MOBIC) 7.5 MG tablet Take 1 tablet (7.5 mg total) by mouth daily as needed for pain. 11/03/20   Soyla Dryer, PA-C  predniSONE (DELTASONE) 50 MG tablet One tablet a day for 5 days 11/22/20   Fransico Meadow, PA-C  promethazine (PHENERGAN) 25 MG tablet Take 1 tablet (25 mg total) by mouth every 6 (six) hours as needed for nausea or vomiting. 07/22/19 10/28/19  Orpah Greek, MD      Allergies    Peanut-containing drug products    Review of Systems    Review of Systems  Constitutional:  Negative for chills and fever.  Respiratory:  Negative for shortness of breath.   Cardiovascular:  Negative for chest pain.  Gastrointestinal:  Negative for abdominal pain.  Skin:  Positive for wound.  Neurological:  Negative for headaches.   Physical Exam Updated Vital Signs BP 115/73 (BP Location: Right Arm)    Pulse 70    Temp 98.7 F (37.1 C) (Oral)    Resp 17    Ht 5\' 4"  (1.626 m)    Wt 130.2 kg    LMP 04/13/2021    SpO2 96%    BMI 49.26 kg/m  Physical Exam Vitals and nursing note reviewed. Exam conducted with a chaperone present.  Constitutional:      General: She is not in acute distress.    Appearance: She is not ill-appearing.  HENT:     Head: Normocephalic and atraumatic.     Nose: No congestion.  Eyes:     Conjunctiva/sclera: Conjunctivae normal.  Cardiovascular:     Rate and Rhythm: Normal rate and regular rhythm.     Pulses: Normal pulses.  Pulmonary:     Effort: Pulmonary effort is normal.  Genitourinary:    Comments: With chaperone presents abscess was visualized on the right internal aspect the gluteal fold on the left buttock.  Measures about the size of a quarter, no overlying skin changes, no drainage or  discharge, fluctuance noted no induration present. Skin:    General: Skin is warm and dry.  Neurological:     Mental Status: She is alert.  Psychiatric:        Mood and Affect: Mood normal.    ED Results / Procedures / Treatments   Labs (all labs ordered are listed, but only abnormal results are displayed) Labs Reviewed - No data to display  EKG None  Radiology No results found.  Procedures .Marland KitchenIncision and Drainage  Date/Time: 04/26/2021 9:05 PM Performed by: Marcello Fennel, PA-C Authorized by: Marcello Fennel, PA-C   Consent:    Consent obtained:  Verbal   Consent given by:  Patient   Risks discussed:  Bleeding, incomplete drainage, pain, damage to other organs and infection   Alternatives  discussed:  No treatment, delayed treatment, alternative treatment, observation and referral Universal protocol:    Patient identity confirmed:  Verbally with patient Location:    Type:  Abscess   Size:  2cm   Location:  Lower extremity   Lower extremity location:  Buttock   Buttock location:  L buttock Pre-procedure details:    Skin preparation:  Antiseptic wash Sedation:    Sedation type:  None Anesthesia:    Anesthesia method:  Local infiltration   Local anesthetic:  Lidocaine 2% WITH epi Procedure type:    Complexity:  Simple Procedure details:    Ultrasound guidance: no     Needle aspiration: no     Incision types:  Single straight   Incision depth:  Subcutaneous   Wound management:  Probed and deloculated   Drainage:  Bloody and purulent   Drainage amount:  Moderate   Wound treatment:  Wound left open   Packing materials:  None Post-procedure details:    Procedure completion:  Tolerated well, no immediate complications    Medications Ordered in ED Medications  lidocaine-EPINEPHrine (XYLOCAINE W/EPI) 2 %-1:200000 (PF) injection 10 mL (10 mLs Infiltration Given 04/26/21 2057)  Tdap (BOOSTRIX) injection 0.5 mL (0.5 mLs Intramuscular Given 04/26/21 2034)    ED Course/ Medical Decision Making/ A&P                           Medical Decision Making Risk Prescription drug management.   This patient presents to the ED for concern of abscess, this involves an extensive number of treatment options, and is a complaint that carries with it a high risk of complications and morbidity.  The differential diagnosis includes necrotizing fasciitis, cellulitis, fistula    Additional history obtained:  Additional history obtained from N/A    Co morbidities that complicate the patient evaluation  N/A  Social Determinants of Health:  N/A    Lab Tests:  I Ordered, and personally interpreted labs.  The pertinent results include:  N/A   Imaging Studies ordered:  I  ordered imaging studies including N/A     Reevaluation: Patient is not up-to-date on her tetanus shot we will update this today.  Patient with skin abscess  located left buttocks, amenable to incision and drainage.  Patient tolerated procedure well.  Abscess was not large enough to warrant packing or drain,   Rule out  no signs of cellulitis surrounding skin will defer no antibiotic therapy.  Low suspicion for necrotizing fasciitis as there is no necrotic skin present, presentation active etiology.  Low session for fistula does not appear to track into the rectum, no fecal matter on my exam.  .  Dispostion and problem list  After consideration of the diagnostic results and the patients response to treatment, I feel that the patent would benefit from discharge.  Abscess-that is draining well, will recommend frequent sits baths, over-the-counter pain medications, follow-up in the next 48 hours if symptoms are worsening.            Final Clinical Impression(s) / ED Diagnoses Final diagnoses:  Abscess    Rx / DC Orders ED Discharge Orders     None         Aron Baba 04/26/21 2107    Hayden Rasmussen, MD 04/27/21 [id number]

## 2021-05-15 ENCOUNTER — Encounter: Payer: Self-pay | Admitting: Nutrition

## 2021-05-24 ENCOUNTER — Ambulatory Visit: Payer: Medicaid Other | Admitting: Physician Assistant

## 2021-06-22 ENCOUNTER — Ambulatory Visit: Payer: No Typology Code available for payment source | Admitting: Nutrition

## 2021-06-26 ENCOUNTER — Encounter: Payer: No Typology Code available for payment source | Attending: Physician Assistant | Admitting: Nutrition

## 2021-06-26 ENCOUNTER — Encounter: Payer: Self-pay | Admitting: Nutrition

## 2021-06-26 DIAGNOSIS — Z6841 Body Mass Index (BMI) 40.0 and over, adult: Secondary | ICD-10-CM | POA: Insufficient documentation

## 2021-06-26 NOTE — Patient Instructions (Signed)
Goals ? ?Go to bed by 9 pm. ?Get up at 330 in am. ?Lose 1/2 per week. ?Exercise 30 minutes 3 times per week ?Cut down on eating out and cook more at home. ?Increase fresh fruits and vegetables ? ?

## 2021-06-26 NOTE — Progress Notes (Signed)
Medical Nutrition Therapy Follow up ?Appointment Start time: 1430 Appointment End time:  1500 ?Primary concerns today: Morbid Obesity  ?Referral diagnosis: E66.01 ?Preferred learning style: No pref  ?Learning readiness: Ready  ? ? ?NUTRITION ASSESSMENT  follow up ?Has been having a lot of migraines.  She admits she is not eating as much. ?Changes made:  has lost 4 1/2 lbs ?Exercising walk around her house, has been helping her mom move stuff around in her house. ?Feels less bloated. ?Has been eating more vegetables and drinking more water ? ? ?Anthropometrics  ?Wt Readings from Last 3 Encounters:  ?04/26/21 287 lb (130.2 kg)  ?04/24/21 287 lb 3.2 oz (130.3 kg)  ?03/23/21 287 lb 6.4 oz (130.4 kg)  ? ?Ht Readings from Last 3 Encounters:  ?04/26/21 5\' 4"  (1.626 m)  ?03/23/21 5\' 4"  (1.626 m)  ?01/10/21 5\' 4"  (1.626 m)  ? ?There is no height or weight on file to calculate BMI. ?@BMIFA @ ?Facility age limit for growth percentiles is 20 years. ?Facility age limit for growth percentiles is 20 years. ?  ? ?Clinical ?Medical Hx: Obesity ?Medications: see chart ? ?Labs:  ?Lab Results  ?Component Value Date  ? HGBA1C 5.4 05/19/2020  ? ? ?Notable Signs/Symptoms: Fatigue, no energy, increased thirsty,  ? ?Lifestyle & Dietary Hx ?Lives with her ex partner and her son. Working FT at 01/12/21. Her mom takes care of son when she works. 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 ? ?Estimated daily fluid intake: 3 bottles of water ?Supplements: MVI,  ?Sleep: 5-6 ?Stress / self-care: financial issues ?Current average weekly physical activity: Walks up and down her street 2 times per week. ? ?24-Hr Dietary Recall ?First Meal: 2 eggs and oatmeal,  ?Snack:  ?Second Meal: cookout grilled chicken sandwich and grilled chicken wrap and fries. Water  ?Snack: water ?Third Meal: Chicken squash and zucchini green beans, sweet tea ?Snack:  ?Beverages: water 2 bottles,   ? ?Estimated Energy Needs ?Calories: 1200 ?Carbohydrate: 135g ?Protein: 90g ?Fat:  33g ? ? ?NUTRITION DIAGNOSIS  ?NI-1.5 Excessive energy intake As related to high calorie high fat diet.  As evidenced by BMI 49. ? ? ?NUTRITION INTERVENTION  ?Nutrition education (E-1) on the following topics:  ?Lifestyle Medicine ?- Whole Food, Plant Predominant Nutrition is highly recommended: Eat Plenty of vegetables, Mushrooms, fruits, Legumes, Whole Grains, Nuts, seeds in lieu of processed meats, processed snacks/pastries red meat, poultry, eggs.  ?  ?-It is better to avoid simple carbohydrates including: Cakes, Sweet Desserts, Ice Cream, Soda (diet and regular), Sweet Tea, Candies, Chips, Cookies, Store Bought Juices, Alcohol in Excess of  1-2 drinks a day, Lemonade,  Artificial Sweeteners, Doughnuts, Coffee Creamers, "Sugar-free" Products, etc, etc.  This is not a complete list..... ? ?Exercise: If you are able: 30 -60 minutes a day ,4 days a week, or 150 minutes a week.  The longer the better.  Combine stretch, strength, and aerobic activities.  If you were told in the past that you have high risk for cardiovascular diseases, you may seek evaluation by your heart doctor prior to initiating moderate to intense exercise programs ? ?Handouts Provided Include  ?Nutrition lifestyle ? ?Learning Style & Readiness for Change ?Teaching method utilized: Visual & Auditory  ?Demonstrated degree of understanding via: Teach Back  ?Barriers to learning/adherence to lifestyle change: none ? ?Goals Established by Pt ?Goals ? ?Go to bed by 9 pm. ?Get up at 330 in am. ?Lose 1/2 per week. ?Exercise 30 minutes 3 times per week ?Cut down on eating out and  cook more at home. ?Increase fresh fruits and vegetables ? ?Focus on more plant based foods. ?Lifestyle Medicine ?- Whole Food, Plant Predominant Nutrition is highly recommended: Eat Plenty of vegetables, Mushrooms, fruits, Legumes, Whole Grains, Nuts, seeds in lieu of processed meats, processed snacks/pastries red meat, poultry, eggs.  ?  ?-It is better to avoid simple  carbohydrates including: Cakes, Sweet Desserts, Ice Cream, Soda (diet and regular), Sweet Tea, Candies, Chips, Cookies, Store Bought Juices, Alcohol in Excess of  1-2 drinks a day, Lemonade,  Artificial Sweeteners, Doughnuts, Coffee Creamers, "Sugar-free" Products, etc, etc.  This is not a complete list..... ? ?Exercise: If you are able: 30 -60 minutes a day ,4 days a week, or 150 minutes a week.  The longer the better.  Combine stretch, strength, and aerobic activities.  If you were told in the past that you have high risk for cardiovascular diseases, you may seek evaluation by your heart doctor prior to initiating moderate to intense exercise programs ? ?MONITORING & EVALUATION ?Dietary intake, weekly physical activity, and weight  in 1 month. ?She would benefit from Ozempic or similar product for needed weight loss and prevention of DM.  ? ?Next Steps  ?Patient is to work on meal planning and cooking foods at home.Marland Kitchen ? ?

## 2021-07-08 ENCOUNTER — Emergency Department (HOSPITAL_COMMUNITY)
Admission: EM | Admit: 2021-07-08 | Discharge: 2021-07-08 | Disposition: A | Discharge status: Home / Self Care | Payer: Medicaid Other | Attending: Emergency Medicine | Admitting: Emergency Medicine

## 2021-07-08 ENCOUNTER — Encounter (HOSPITAL_COMMUNITY): Payer: Self-pay

## 2021-07-08 ENCOUNTER — Other Ambulatory Visit: Payer: Self-pay

## 2021-07-08 DIAGNOSIS — R112 Nausea with vomiting, unspecified: Secondary | ICD-10-CM | POA: Insufficient documentation

## 2021-07-08 DIAGNOSIS — R197 Diarrhea, unspecified: Secondary | ICD-10-CM | POA: Diagnosis not present

## 2021-07-08 DIAGNOSIS — Z331 Pregnant state, incidental: Secondary | ICD-10-CM | POA: Insufficient documentation

## 2021-07-08 DIAGNOSIS — N9489 Other specified conditions associated with female genital organs and menstrual cycle: Secondary | ICD-10-CM | POA: Diagnosis not present

## 2021-07-08 DIAGNOSIS — R1084 Generalized abdominal pain: Secondary | ICD-10-CM | POA: Diagnosis not present

## 2021-07-08 DIAGNOSIS — R079 Chest pain, unspecified: Secondary | ICD-10-CM | POA: Insufficient documentation

## 2021-07-08 LAB — URINALYSIS, ROUTINE W REFLEX MICROSCOPIC
Bilirubin Urine: NEGATIVE
Glucose, UA: NEGATIVE mg/dL
Hgb urine dipstick: NEGATIVE
Ketones, ur: 20 mg/dL — AB
Nitrite: NEGATIVE
Protein, ur: 30 mg/dL — AB
Specific Gravity, Urine: 1.016 (ref 1.005–1.030)
Squamous Epithelial / HPF: >50 — ABNORMAL HIGH (ref 0–5)
pH: 6 (ref 5.0–8.0)

## 2021-07-08 LAB — RAPID URINE DRUG SCREEN, HOSP PERFORMED
Amphetamines: NOT DETECTED
Barbiturates: NOT DETECTED
Benzodiazepines: NOT DETECTED
Cocaine: NOT DETECTED
Opiates: NOT DETECTED
Tetrahydrocannabinol: POSITIVE — AB

## 2021-07-08 LAB — COMPREHENSIVE METABOLIC PANEL
ALT: 18 U/L (ref 0–44)
AST: 16 U/L (ref 15–41)
Albumin: 3.5 g/dL (ref 3.5–5.0)
Alkaline Phosphatase: 65 U/L (ref 38–126)
Anion gap: 7 (ref 5–15)
BUN: 12 mg/dL (ref 6–20)
CO2: 24 mmol/L (ref 22–32)
Calcium: 8.9 mg/dL (ref 8.9–10.3)
Chloride: 105 mmol/L (ref 98–111)
Creatinine, Ser: 1.05 mg/dL — ABNORMAL HIGH (ref 0.44–1.00)
GFR, Estimated: >60 mL/min (ref >60)
Glucose, Bld: 130 mg/dL — ABNORMAL HIGH (ref 70–99)
Potassium: 3.9 mmol/L (ref 3.5–5.1)
Sodium: 136 mmol/L (ref 135–145)
Total Bilirubin: 0.3 mg/dL (ref 0.3–1.2)
Total Protein: 6.9 g/dL (ref 6.5–8.1)

## 2021-07-08 LAB — CBC
HCT: 36.6 % (ref 36.0–46.0)
Hemoglobin: 12.1 g/dL (ref 12.0–15.0)
MCH: 24.3 pg — ABNORMAL LOW (ref 26.0–34.0)
MCHC: 33.1 g/dL (ref 30.0–36.0)
MCV: 73.6 fL — ABNORMAL LOW (ref 80.0–100.0)
Platelets: 239 10*3/uL (ref 150–400)
RBC: 4.97 MIL/uL (ref 3.87–5.11)
RDW: 15.1 % (ref 11.5–15.5)
WBC: 15.2 10*3/uL — ABNORMAL HIGH (ref 4.0–10.5)
nRBC: 0 % (ref 0.0–0.2)

## 2021-07-08 LAB — HCG, QUANTITATIVE, PREGNANCY: hCG, Beta Chain, Quant, S: 9059 m[IU]/mL — ABNORMAL HIGH (ref <5)

## 2021-07-08 LAB — PREGNANCY, URINE: Preg Test, Ur: POSITIVE — AB

## 2021-07-08 LAB — LIPASE, BLOOD: Lipase: 22 U/L (ref 11–51)

## 2021-07-08 MED ORDER — SODIUM CHLORIDE 0.9 % IV BOLUS
1000.0000 mL | Freq: Once | INTRAVENOUS | Status: AC
  Administered 2021-07-08: 1000 mL via INTRAVENOUS

## 2021-07-08 MED ORDER — ONDANSETRON HCL 4 MG PO TABS: 4.0000 mg | ORAL_TABLET | Freq: Four times a day (QID) | ORAL | 0 refills | Status: DC

## 2021-07-08 MED ORDER — PROCHLORPERAZINE EDISYLATE 10 MG/2ML IJ SOLN
10.0000 mg | Freq: Once | INTRAMUSCULAR | Status: AC
  Administered 2021-07-08: 10 mg via INTRAVENOUS
  Filled 2021-07-08: qty 2

## 2021-07-08 MED ORDER — ONDANSETRON HCL 4 MG/2ML IJ SOLN
4.0000 mg | Freq: Once | INTRAMUSCULAR | Status: AC
  Administered 2021-07-08: 4 mg via INTRAVENOUS
  Filled 2021-07-08: qty 2

## 2021-07-08 NOTE — Discharge Instructions (Addendum)
It appears that you are pregnant.,  Because of this she will need to be very careful about the medications that you take.  Tylenol is safe for pain and you may take up to 650 mg every 6 hours as needed.  We will prescribe Zofran for nausea, this can be taken 1 tablet every 6 hours as needed, you will need to drink plenty of clear liquids and follow-up with a gynecologist.  I have given you their phone number above ? ?Thank you for letting us take care of you today! ? ?Please obtain all of your results from medical records or have your doctors office obtain the results - share them with your doctor - you should be seen at your doctors office in the next 2 days. Call today to arrange your follow up. Take the medications as prescribed. Please review all of the medicines and only take them if you do not have an allergy to them. Please be aware that if you are taking birth control pills, taking other prescriptions, ESPECIALLY ANTIBIOTICS may make the birth control ineffective - if this is the case, either do not engage in sexual activity or use alternative methods of birth control such as condoms until you have finished the medicine and your family doctor says it is OK to restart them. If you are on a blood thinner such as COUMADIN, be aware that any other medicine that you take may cause the coumadin to either work too much, or not enough - you should have your coumadin level rechecked in next 7 days if this is the case.  ??  ?It is also a possibility that you have an allergic reaction to any of the medicines that you have been prescribed - Everybody reacts differently to medications and while MOST people have no trouble with most medicines, you may have a reaction such as nausea, vomiting, rash, swelling, shortness of breath. If this is the case, please stop taking the medicine immediately and contact your physician.  ? ?If you were given a medication in the ED such as percocet, vicodin, or morphine, be aware that  these medicines are sedating and may change your ability to take care of yourself adequately for several hours after being given this medicines - you should not drive or take care of small children if you were given this medicine in the Emergency Department or if you have been prescribed these types of medicines. ??  ? You should return to the ER IMMEDIATELY if you develop severe or worsening symptoms.  ? ?

## 2021-07-08 NOTE — ED Provider Notes (Signed)
?[facility] EMERGENCY DEPARTMENT ?Provider Note ? ? ?CSN: [id number] ?Arrival date & time: 07/08/21  [id number] ? ?  ? ?History ? ?Chief Complaint  ?Patient presents with  ? Nausea  ? Emesis  ? Diarrhea  ? Chest Pain  ? ? ?Lindsay James is a 31 y.o. female. ? ? ?Emesis ?Associated symptoms: diarrhea   ?Diarrhea ?Associated symptoms: vomiting   ?Chest Pain ?Associated symptoms: vomiting   ? ?This patient is a 31 year old female, she has a medical history that is significant for having a high body mass index but no other chronic significant abdominal problems.  She reports that overnight she developed nausea vomiting and diarrhea.  The diarrhea is loose, no watery or bloody stools.  She has vomited approximately 7 times overnight.  She has mild diffuse abdominal pain which is nonfocal.  She had a chicken sandwich which she thought might not be cooked all the way last evening.  Nobody else in the house is having vomiting or diarrhea, she has not been traveling anywhere but the beach recently, no new medications, she has had nothing for nausea prior to arrival.  She is unaware if she is pregnant.She states that she has vomited so many times that she now has some chest discomfort from the vomit ? ?Home Medications ?Prior to Admission medications   ?Medication Sig Start Date End Date Taking? Authorizing Provider  ?ondansetron (ZOFRAN) 4 MG tablet Take 1 tablet (4 mg total) by mouth every 6 (six) hours. 07/08/21  Yes Eber HongMiller, Kennis Buell, MD  ?promethazine (PHENERGAN) 25 MG tablet Take 1 tablet (25 mg total) by mouth every 6 (six) hours as needed for nausea or vomiting. 07/22/19 10/28/19  Gilda CreasePollina, Christopher J, MD  ?   ? ?Allergies    ?Peanut-containing drug products   ? ?Review of Systems   ?Review of Systems  ?Cardiovascular:  Positive for chest pain.  ?Gastrointestinal:  Positive for diarrhea and vomiting.  ?All other systems reviewed and are negative. ? ?Physical Exam ?Updated Vital Signs ?BP 108/69   Pulse 70   Temp 98.3 ?F  (36.8 ?C) (Oral)   Resp 17   Ht 1.626 m (5\' 4" )   Wt 126.1 kg   SpO2 94%   BMI 47.72 kg/m?  ?Physical Exam ?Vitals and nursing note reviewed.  ?Constitutional:   ?   General: She is not in acute distress. ?   Appearance: She is well-developed.  ?HENT:  ?   Head: Normocephalic and atraumatic.  ?   Mouth/Throat:  ?   Pharynx: No oropharyngeal exudate.  ?Eyes:  ?   General: No scleral icterus.    ?   Right eye: No discharge.     ?   Left eye: No discharge.  ?   Conjunctiva/sclera: Conjunctivae normal.  ?   Pupils: Pupils are equal, round, and reactive to light.  ?Neck:  ?   Thyroid: No thyromegaly.  ?   Vascular: No JVD.  ?Cardiovascular:  ?   Rate and Rhythm: Normal rate and regular rhythm.  ?   Heart sounds: Normal heart sounds. No murmur heard. ?  No friction rub. No gallop.  ?Pulmonary:  ?   Effort: Pulmonary effort is normal. No respiratory distress.  ?   Breath sounds: Normal breath sounds. No wheezing or rales.  ?Abdominal:  ?   General: Bowel sounds are normal. There is no distension.  ?   Palpations: Abdomen is soft. There is no mass.  ?   Tenderness: There is abdominal tenderness.  ?  Comments: Extremely soft abdomen but mild diffuse abdominal tenderness, no guarding, no Murphy sign  ?Musculoskeletal:     ?   General: No tenderness. Normal range of motion.  ?   Cervical back: Normal range of motion and neck supple.  ?   Right lower leg: No edema.  ?   Left lower leg: No edema.  ?Lymphadenopathy:  ?   Cervical: No cervical adenopathy.  ?Skin: ?   General: Skin is warm and dry.  ?   Findings: No erythema or rash.  ?Neurological:  ?   Mental Status: She is alert.  ?   Coordination: Coordination normal.  ?Psychiatric:     ?   Behavior: Behavior normal.  ? ? ?ED Results / Procedures / Treatments   ?Labs ?(all labs ordered are listed, but only abnormal results are displayed) ?Labs Reviewed  ?COMPREHENSIVE METABOLIC PANEL - Abnormal; Notable for the following components:  ?    Result Value  ? Glucose, Bld 130  (*)   ? Creatinine, Ser 1.05 (*)   ? All other components within normal limits  ?CBC - Abnormal; Notable for the following components:  ? WBC 15.2 (*)   ? MCV 73.6 (*)   ? MCH 24.3 (*)   ? All other components within normal limits  ?URINALYSIS, ROUTINE W REFLEX MICROSCOPIC - Abnormal; Notable for the following components:  ? APPearance CLOUDY (*)   ? Ketones, ur 20 (*)   ? Protein, ur 30 (*)   ? Leukocytes,Ua MODERATE (*)   ? Bacteria, UA FEW (*)   ? Squamous Epithelial / LPF >50 (*)   ? All other components within normal limits  ?RAPID URINE DRUG SCREEN, HOSP PERFORMED - Abnormal; Notable for the following components:  ? Tetrahydrocannabinol POSITIVE (*)   ? All other components within normal limits  ?PREGNANCY, URINE - Abnormal; Notable for the following components:  ? Preg Test, Ur POSITIVE (*)   ? All other components within normal limits  ?HCG, QUANTITATIVE, PREGNANCY - Abnormal; Notable for the following components:  ? hCG, Beta Chain, Quant, S 9,059 (*)   ? All other components within normal limits  ?URINE CULTURE  ?LIPASE, BLOOD  ? ? ?EKG ?EKG Interpretation ? ?Date/Time:  Saturday July 08 2021 09:44:54 EDT ?Ventricular Rate:  58 ?PR Interval:  140 ?QRS Duration: 88 ?QT Interval:  412 ?QTC Calculation: 405 ?R Axis:   73 ?Text Interpretation: Sinus rhythm Normal ECG Confirmed by Eber Hong ([id number]) on 07/08/2021 9:54:38 AM ? ?Radiology ?No results found. ? ?Procedures ?Procedures  ? ? ?Medications Ordered in ED ?Medications  ?prochlorperazine (COMPAZINE) injection 10 mg (10 mg Intravenous Given 07/08/21 0836)  ?sodium chloride 0.9 % bolus 1,000 mL (1,000 mLs Intravenous New Bag/Given 07/08/21 0835)  ?ondansetron [facility]) injection 4 mg (4 mg Intravenous Given 07/08/21 0851)  ? ? ?ED Course/ Medical Decision Making/ A&P ?Clinical Course as of 07/08/21 1103  ?Sat Jul 08, 2021  ?1002 Patient ended up having a positive pregnancy test, she is also positive for marijuana.  A quant was sent to verify the level which  is pending at this time [BM]  ?  ?Clinical Course User Index ?[BM] Eber Hong, MD  ? ?                        ?Medical Decision Making ?Amount and/or Complexity of Data Reviewed ?Labs: ordered. ? ?Risk ?Prescription drug management. ? ? ?This patient presents to the ED for concern of abdominal  discomfort with nausea vomiting and potentially diarrhea, this involves an extensive number of treatment options, and is a complaint that carries with it a high risk of complications and morbidity.  The differential diagnosis includes gastroenteritis, viral, food poisoning, less likely to be gallbladder disease, less likely to be pancreatitis, will need to rule out pregnancy as well ? ? ?Co morbidities that complicate the patient evaluation ? ?Very high body mass index ? ? ?Additional history obtained: ? ?Additional history obtained from medical record ?External records from outside source obtained and reviewed including imaging, the patient has had a CT scan of the abdomen and pelvis most recently in November 2021, during that time she had acute appendicitis ? ? ?Lab Tests: ? ?I Ordered, and personally interpreted labs.  The pertinent results include: CBC metabolic panel urinalysis and urine pregnancy, the pregnancy was positive so a quant was ordered.  The quant was just over 9000.  The patient has an incidental white blood cell count of 15,000, unremarkable metabolic panel and a urinalysis which had lots of squamous cells, no white blood cells, culture was ordered due to the patient's pregnant status.  She has no urinary symptoms.  Additionally she did have some ketones in the urine so IV fluids were given, additionally she had a positive screen for marijuana ? ? ?Imaging Studies ordered: ? ?Not indicated ? ? ?Cardiac Monitoring: / EKG: ? ?The patient was maintained on a cardiac monitor.  I personally viewed and interpreted the cardiac monitored which showed an underlying rhythm of: Normal sinus rhythm, no tachycardia or  arrhythmia, EKG was totally unremarkable ? ? ?Consultations Obtained: ? ?No consults obtained, patient referred to OB/GYN as an outpatient ? ? ?Problem List / ED Course / Critical interventions / Medication man

## 2021-07-08 NOTE — ED Triage Notes (Signed)
Pt c/o nausea, vomiting, and diarrhea over the past couple days. Reports that she began experiencing sharp chest pain this morning after vomiting.  ?

## 2021-07-10 LAB — URINE CULTURE: Culture: <10000 — AB

## 2021-08-07 ENCOUNTER — Other Ambulatory Visit: Payer: Self-pay | Admitting: Obstetrics & Gynecology

## 2021-08-07 DIAGNOSIS — O3680X0 Pregnancy with inconclusive fetal viability, not applicable or unspecified: Secondary | ICD-10-CM

## 2021-08-08 ENCOUNTER — Ambulatory Visit (INDEPENDENT_AMBULATORY_CARE_PROVIDER_SITE_OTHER): Payer: Self-pay

## 2021-08-08 DIAGNOSIS — O3680X0 Pregnancy with inconclusive fetal viability, not applicable or unspecified: Secondary | ICD-10-CM

## 2021-08-08 DIAGNOSIS — Z3A09 9 weeks gestation of pregnancy: Secondary | ICD-10-CM

## 2021-08-08 NOTE — Progress Notes (Signed)
[place] 9+5 wks,single IUP with yolk sac,FHR 171 bpm,normal ovaries

## 2021-08-28 ENCOUNTER — Encounter: Payer: Medicaid Other | Attending: Physician Assistant | Admitting: Nutrition

## 2021-08-28 ENCOUNTER — Encounter: Payer: Self-pay | Admitting: Nutrition

## 2021-08-28 ENCOUNTER — Other Ambulatory Visit: Payer: Self-pay | Admitting: Obstetrics & Gynecology

## 2021-08-28 DIAGNOSIS — O99212 Obesity complicating pregnancy, second trimester: Secondary | ICD-10-CM | POA: Insufficient documentation

## 2021-08-28 DIAGNOSIS — Z6841 Body Mass Index (BMI) 40.0 and over, adult: Secondary | ICD-10-CM | POA: Insufficient documentation

## 2021-08-28 DIAGNOSIS — Z3492 Encounter for supervision of normal pregnancy, unspecified, second trimester: Secondary | ICD-10-CM | POA: Insufficient documentation

## 2021-08-28 DIAGNOSIS — Z3682 Encounter for antenatal screening for nuchal translucency: Secondary | ICD-10-CM

## 2021-08-28 DIAGNOSIS — Z3A16 16 weeks gestation of pregnancy: Secondary | ICD-10-CM | POA: Insufficient documentation

## 2021-08-28 NOTE — Progress Notes (Signed)
Medical Nutrition Therapy 1100 Appointment End time:  1130 Primary concerns today: Morbid Obesity  Referral diagnosis: E66.01 Preferred learning style: No pref  Learning readiness: Ready    NUTRITION ASSESSMENT  follow up She just found out she is pregnant, is 4 months. EDU 12.21.23.Goes to [facility]. Has applied for Medicaid. To sign up for [facility] today. Changes made with eating habits: Eating more fruit, egg sandwiches. Takes VIt B 6 and prenatal vitamins. Drinking only water.  Migraines are still there.  Is only tylenol for her migraines.  Still sleeps a lot. No longer smoking. Quit smoking in May 2023.  Still working at [employer]. Has morning sickness that limits her ability to eat at certain times or certain foods. She is trying to eat much healthier and has cut out a lot of processed junk food already.   Anthropometrics  Wt Readings from Last 3 Encounters:  07/08/21 278 lb (126.1 kg)  06/26/21 282 lb 6.4 oz (128.1 kg)  04/26/21 287 lb (130.2 kg)   Ht Readings from Last 3 Encounters:  07/08/21 5\' 4"  (1.626 m)  06/26/21 5\' 4"  (1.626 m)  04/26/21 5\' 4"  (1.626 m)   There is no height or weight on file to calculate BMI. @BMIFA @ Facility age limit for growth %iles is 20 years. Facility age limit for growth %iles is 20 years.    Clinical Medical Hx: Obesity Medications: see chart  Labs:  Lab Results  Component Value Date   HGBA1C 5.4 05/19/2020    Notable Signs/Symptoms: Fatigue, no energy, increased thirsty,   Lifestyle & Dietary Hx Lives with her ex partner and her son. Working FT at 06/24/21. Her mom takes care of son when she works. 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  Estimated daily fluid intake: 3 bottles of water Supplements: MVI,  Sleep: 5-6 Stress / self-care: financial issues Current average weekly physical activity: Walks up and down her street 2 times per week.  24-Hr Dietary Recall First Meal:  11 am Pancakes and eggs, water  2nd meal Meal: 430 pm.  Grilled chicken sandwich, water Couldn't eat chinese foods.  Beverages: water 2 bottles,    Estimated Energy Needs Calories: 1200 Carbohydrate: 135g Protein: 90g Fat: 33g   NUTRITION DIAGNOSIS  NI-1.5 Excessive energy intake As related to high calorie high fat diet.  As evidenced by BMI 49.   NUTRITION INTERVENTION  Nutrition education (E-1) on the following topics:  Lifestyle Medicine - Whole Food, Plant Predominant Nutrition is highly recommended: Eat Plenty of vegetables, Mushrooms, fruits, Legumes, Whole Grains, Nuts, seeds in lieu of processed meats, processed snacks/pastries red meat, poultry, eggs.    -It is better to avoid simple carbohydrates including: Cakes, Sweet Desserts, Ice Cream, Soda (diet and regular), Sweet Tea, Candies, Chips, Cookies, Store Bought Juices, Alcohol in Excess of  1-2 drinks a day, Lemonade,  Artificial Sweeteners, Doughnuts, Coffee Creamers, "Sugar-free" Products, etc, etc.  This is not a complete list.....  Exercise: If you are able: 30 -60 minutes a day ,4 days a week, or 150 minutes a week.  The longer the better.  Combine stretch, strength, and aerobic activities.  If you were told in the past that you have high risk for cardiovascular diseases, you may seek evaluation by your heart doctor prior to initiating moderate to intense exercise programs  Handouts Provided Include  Nutrition lifestyle  Learning Style & Readiness for Change Teaching method utilized: Visual & Auditory  Demonstrated degree of understanding via: Teach Back  Barriers to learning/adherence to lifestyle change: none  Goals Established by Pt   Start getting wheat bread Be sure to eat 3 meals per day Keep drinking your water Continue to eat more fruits and vegetables whole grains. Get signed up for [facility]. Walk 30 minutes a day. ASK your OBGYN MD if they should get your blood sugars earlier than normal with history of prediabetes/obesity.     Focus on more plant based  foods. Lifestyle Medicine - Whole Food, Plant Predominant Nutrition is highly recommended: Eat Plenty of vegetables, Mushrooms, fruits, Legumes, Whole Grains, Nuts, seeds in lieu of processed meats, processed snacks/pastries red meat, poultry, eggs.    -It is better to avoid simple carbohydrates including: Cakes, Sweet Desserts, Ice Cream, Soda (diet and regular), Sweet Tea, Candies, Chips, Cookies, Store Bought Juices, Alcohol in Excess of  1-2 drinks a day, Lemonade,  Artificial Sweeteners, Doughnuts, Coffee Creamers, "Sugar-free" Products, etc, etc.  This is not a complete list.....  Exercise: If you are able: 30 -60 minutes a day ,4 days a week, or 150 minutes a week.  The longer the better.  Combine stretch, strength, and aerobic activities.  If you were told in the past that you have high risk for cardiovascular diseases, you may seek evaluation by your heart doctor prior to initiating moderate to intense exercise programs  MONITORING & EVALUATION Dietary intake, weekly physical activity, and weight  in PRN  Next Steps  Patient is to work on meal planning and cooking foods at home.Marland Kitchen

## 2021-08-28 NOTE — Patient Instructions (Addendum)
Goals   Start getting wheat bread Be sure to eat 3 meals per day Keep drinking your water Continue to eat more fruits and vegetables whole grains. Get signed up for [facility]. Walk 30 minutes a day. ASK your OBGYN MD if they should get your blood sugars earlier than normal with history of prediabetes/obesity.

## 2021-08-29 ENCOUNTER — Other Ambulatory Visit: Payer: No Typology Code available for payment source

## 2021-08-30 ENCOUNTER — Ambulatory Visit (INDEPENDENT_AMBULATORY_CARE_PROVIDER_SITE_OTHER): Payer: Medicaid Other

## 2021-08-30 ENCOUNTER — Telehealth: Payer: Self-pay | Admitting: *Deleted

## 2021-08-30 ENCOUNTER — Other Ambulatory Visit: Payer: Medicaid Other

## 2021-08-30 DIAGNOSIS — Z3682 Encounter for antenatal screening for nuchal translucency: Secondary | ICD-10-CM | POA: Diagnosis not present

## 2021-08-30 MED ORDER — ONDANSETRON HCL 4 MG PO TABS: 4.0000 mg | ORAL_TABLET | Freq: Four times a day (QID) | ORAL | 0 refills | Status: DC

## 2021-08-30 NOTE — Progress Notes (Signed)
[place] 12+6 wks,measurements c/w dates,FHR 155 bpm,NB present,NT 1.9 mm,anterior placenta,normal ovaries

## 2021-08-30 NOTE — Telephone Encounter (Signed)
Will rx zofran 4 mg for nausea and vomiting

## 2021-08-30 NOTE — Telephone Encounter (Signed)
Pt needs something for nausea, can't keep anything down. Hasn't had new ob visit yet. Pt aware to check at pharmacy later.

## 2021-08-31 ENCOUNTER — Encounter: Payer: Self-pay | Admitting: Advanced Practice Midwife

## 2021-08-31 ENCOUNTER — Ambulatory Visit: Payer: No Typology Code available for payment source | Admitting: *Deleted

## 2021-08-31 DIAGNOSIS — Z3490 Encounter for supervision of normal pregnancy, unspecified, unspecified trimester: Secondary | ICD-10-CM | POA: Insufficient documentation

## 2021-08-31 DIAGNOSIS — Z3483 Encounter for supervision of other normal pregnancy, third trimester: Secondary | ICD-10-CM | POA: Insufficient documentation

## 2021-09-01 ENCOUNTER — Encounter: Payer: Self-pay | Admitting: Advanced Practice Midwife

## 2021-09-01 ENCOUNTER — Ambulatory Visit (INDEPENDENT_AMBULATORY_CARE_PROVIDER_SITE_OTHER): Payer: Self-pay | Admitting: Advanced Practice Midwife

## 2021-09-01 ENCOUNTER — Other Ambulatory Visit (HOSPITAL_COMMUNITY)
Admission: RE | Admit: 2021-09-01 | Discharge: 2021-09-01 | Disposition: A | Discharge status: Home / Self Care | Payer: Medicaid Other | Source: Ambulatory Visit | Attending: Advanced Practice Midwife | Admitting: Advanced Practice Midwife

## 2021-09-01 ENCOUNTER — Ambulatory Visit: Payer: Medicaid Other | Admitting: *Deleted

## 2021-09-01 VITALS — BP 117/65 | HR 72 | Wt 270.0 lb

## 2021-09-01 DIAGNOSIS — O09299 Supervision of pregnancy with other poor reproductive or obstetric history, unspecified trimester: Secondary | ICD-10-CM

## 2021-09-01 DIAGNOSIS — Z124 Encounter for screening for malignant neoplasm of cervix: Secondary | ICD-10-CM

## 2021-09-01 DIAGNOSIS — Z3A13 13 weeks gestation of pregnancy: Secondary | ICD-10-CM | POA: Insufficient documentation

## 2021-09-01 DIAGNOSIS — Z113 Encounter for screening for infections with a predominantly sexual mode of transmission: Secondary | ICD-10-CM

## 2021-09-01 DIAGNOSIS — Z3481 Encounter for supervision of other normal pregnancy, first trimester: Secondary | ICD-10-CM | POA: Insufficient documentation

## 2021-09-01 DIAGNOSIS — Z3480 Encounter for supervision of other normal pregnancy, unspecified trimester: Secondary | ICD-10-CM

## 2021-09-01 LAB — CBC/D/PLT+RPR+RH+ABO+RUBIGG...
Antibody Screen: NEGATIVE
Basophils Absolute: 0 10*3/uL (ref 0.0–0.2)
Basos: 0 %
EOS (ABSOLUTE): 0 10*3/uL (ref 0.0–0.4)
Eos: 0 %
HCV Ab: NONREACTIVE
HIV Screen 4th Generation wRfx: NONREACTIVE
Hematocrit: 37.6 % (ref 34.0–46.6)
Hemoglobin: 12.2 g/dL (ref 11.1–15.9)
Hepatitis B Surface Ag: NEGATIVE
Immature Grans (Abs): 0.1 10*3/uL (ref 0.0–0.1)
Immature Granulocytes: 1 %
Lymphocytes Absolute: 2.1 10*3/uL (ref 0.7–3.1)
Lymphs: 12 %
MCH: 24.4 pg — ABNORMAL LOW (ref 26.6–33.0)
MCHC: 32.4 g/dL (ref 31.5–35.7)
MCV: 75 fL — ABNORMAL LOW (ref 79–97)
Monocytes Absolute: 0.7 10*3/uL (ref 0.1–0.9)
Monocytes: 4 %
Neutrophils Absolute: 15.1 10*3/uL — ABNORMAL HIGH (ref 1.4–7.0)
Neutrophils: 83 %
Platelets: 254 10*3/uL (ref 150–450)
RBC: 5.01 x10E6/uL (ref 3.77–5.28)
RDW: 15.7 % — ABNORMAL HIGH (ref 11.7–15.4)
RPR Ser Ql: NONREACTIVE
Rh Factor: POSITIVE
Rubella Antibodies, IGG: 1.07 index (ref >0.99)
WBC: 18.1 10*3/uL — ABNORMAL HIGH (ref 3.4–10.8)

## 2021-09-01 LAB — INTEGRATED 1
Crown Rump Length: 68.6 mm
Gest. Age on Collection Date: 12.9 weeks
Maternal Age at EDD: 30.4 yr
Nuchal Translucency (NT): 1.9 mm
Number of Fetuses: 1
PAPP-A Value: 1100 ng/mL
Weight: 282 [lb_av]

## 2021-09-01 LAB — POCT URINALYSIS DIPSTICK OB
Blood, UA: NEGATIVE
Glucose, UA: NEGATIVE
Ketones, UA: NEGATIVE
Leukocytes, UA: NEGATIVE
Nitrite, UA: NEGATIVE
POC,PROTEIN,UA: NEGATIVE

## 2021-09-01 LAB — HCV INTERPRETATION

## 2021-09-01 MED ORDER — BLOOD PRESSURE MONITOR MISC: 0 refills | Status: DC

## 2021-09-01 MED ORDER — ASPIRIN 81 MG PO CHEW: 162.0000 mg | CHEWABLE_TABLET | Freq: Every day | ORAL | 7 refills | Status: DC

## 2021-09-01 NOTE — Progress Notes (Signed)
INITIAL OBSTETRICAL VISIT Patient name: Lindsay James MRN [id number]  Date of birth: 1990-12-22 Chief Complaint:   Initial Prenatal Visit  History of Present Illness:   Lindsay James is a 31 y.o. G45P1011 African-American female at [redacted]w[redacted]d by [place] at 9.5 weeks with an Estimated Date of Delivery: 03/08/22 being seen today for her initial obstetrical visit.   No LMP recorded (lmp unknown). Patient is pregnant. Her obstetrical history is significant for  SVD 2016 at [facility] (short cx>progesterone>40wk delivery); SAB 2020 .   Today she reports N/V- needs to pick up Zofran rx.  Last pap unsure. Results were:  doesn't remember hx of abnormal Pap     09/01/2021   10:03 AM 01/10/2021    8:26 AM 02/15/2020    2:50 PM 01/26/2020    9:40 AM 01/08/2020   11:54 AM  Depression screen PHQ 2/9  Decreased Interest 3 0 0 3 2  Down, Depressed, Hopeless 3 0 0 2 2  PHQ - 2 Score 6 0 0 5 4  Altered sleeping 3  1 3 2   Tired, decreased energy 3  1 1 2   Change in appetite 2  1 3 2   Feeling bad or failure about yourself  0  1 2 2   Trouble concentrating 2  0 0 2  Moving slowly or fidgety/restless 3  1 0 2  Suicidal thoughts 0  0 0 0  PHQ-9 Score 19  5 14 16         09/01/2021   10:03 AM 02/15/2020    2:51 PM 01/26/2020    9:43 AM 01/08/2020   11:54 AM  GAD 7 : Generalized Anxiety Score  Nervous, Anxious, on Edge 3 1 1 2   Control/stop worrying 3 1 1 2   Worry too much - different things 3 1 1 2   Trouble relaxing 3 1 1 2   Restless 3 0 1 0  Easily annoyed or irritable 3 0 0 2  Afraid - awful might happen 3 0 1 2  Total GAD 7 Score 21 4 6 12      Review of Systems:   Pertinent items are noted in HPI Denies cramping/contractions, leakage of fluid, vaginal bleeding, abnormal vaginal discharge w/ itching/odor/irritation, headaches, visual changes, shortness of breath, chest pain, abdominal pain, severe nausea/vomiting, or problems with urination or bowel movements unless otherwise stated above.   Pertinent History Reviewed:  Reviewed past medical,surgical, social, obstetrical and family history.  Reviewed problem list, medications and allergies. OB History  Gravida Para Term Preterm AB Living  3 1 1   1 1   SAB IAB Ectopic Multiple Live Births  1       1    # Outcome Date GA Lbr Len/2nd Weight Sex Delivery Anes PTL Lv  3 Current           2 SAB 2020          1 Term 07/06/14 [redacted]w[redacted]d  7 lb 9 oz (3.43 kg) M Vag-Spont EPI N    Physical Assessment:   Vitals:   09/01/21 0959  BP: 117/65  Pulse: 72  There is no height or weight on file to calculate BMI.       Physical Examination:  General appearance - well appearing, and in no distress  Mental status - alert, oriented to person, place, and time  Psych:  She has a normal mood and affect  Skin - warm and dry, normal color, no suspicious lesions noted  Chest - effort  normal, all lung fields clear to auscultation bilaterally  Heart - normal rate and regular rhythm  Abdomen - soft, nontender; FHTs 156bpm  Extremities:  No swelling or varicosities noted  Pelvic - VULVA: normal appearing vulva with no masses, tenderness or lesions  VAGINA: normal appearing vagina with normal color and discharge, no lesions  CERVIX: normal appearing cervix without discharge or lesions, no CMT  Thin prep pap is done with HR HPV cotesting  Chaperone: Jobe Marker    08/30/21 NT [place] 12+6 wks,measurements c/w dates,FHR 155 bpm,NB present,NT 1.9 mm,anterior placenta,normal ovaries  No results found for this or any previous visit (from the past 24 hour(s)).  Assessment & Plan:  1) Low-Risk Pregnancy G3P1011 at [redacted]w[redacted]d with an Estimated Date of Delivery: 03/08/22   2) Initial OB visit  3) History of short cervix with delivery at 40.3wks> used vag progesterone, nl cx @ NT u/s, will continue to follow at anatomy u/s  4) +depression/anxiety on screening, no meds  Meds:  Meds ordered this encounter  Medications   Blood Pressure Monitor MISC    Sig:  For regular home bp monitoring during pregnancy    Dispense:  1 each    Refill:  0    Z34.81 Please mail to patient Needs large cuff   aspirin 81 MG chewable tablet    Sig: Chew 2 tablets (162 mg total) by mouth daily.    Dispense:  60 tablet    Refill:  7    Order Specific Question:   Supervising Provider    Answer:   Duane Lope H [[id number]]    Initial labs obtained Continue prenatal vitamins Reviewed n/v relief measures and warning s/s to report Reviewed recommended weight gain based on pre-gravid BMI Encouraged well-balanced diet Genetic & carrier screening discussed: requests Panorama and NT/IT, requests Horizon  Ultrasound discussed; fetal survey: requested CCNC completed> form faxed if has or is planning to apply for medicaid The nature of [facility] [employer] with multiple MDs and other Advanced Practice Providers was explained to patient; also emphasized that fellows, residents, and students are part of our team. Does not have home bp cuff. Office bp cuff given: no. Rx sent: yes. Check bp weekly, let us know if consistently >140/90.   Indications for ASA therapy (per uptodate) OR Two or more of the following: Obesity (BMI>30 kg/m2) Yes Sociodemographic characteristics (African American race, low socioeconomic level) Yes   Indications for early A1C (per uptodate) BMI >=25 (>=23 in Asian women) AND one of the following Previous A1C?5.7, impaired glucose tolerance, or impaired fasting glucose on previous testing Yes   Follow-up: Return for 3-4wk LROB, 2nd IT; then 7wk LROB w anatomy u/s.   Orders Placed This Encounter  Procedures   Urine Culture   Genetic Screening   POC Urinalysis Dipstick OB    [facility] 121 09/01/2021 10:53 AM

## 2021-09-01 NOTE — Patient Instructions (Signed)
Lindsay James, thank you for choosing our office today! We appreciate the opportunity to meet your healthcare needs. You may receive a short survey by mail, e-mail, or through MyChart. If you are happy with your care we would appreciate if you could take just a few minutes to complete the survey questions. We read all of your comments and take your feedback very seriously. Thank you again for choosing our office.  [facility] ([address] [place], [place] [address]) Entrance C, located off of [address] Free 24/7 valet parking   Nausea & Vomiting Have saltine crackers or pretzels by your bed and eat a few bites before you raise your head out of bed in the morning Eat small frequent meals throughout the day instead of large meals Drink plenty of fluids throughout the day to stay hydrated, just don't drink a lot of fluids with your meals.  This can make your stomach fill up faster making you feel sick Do not brush your teeth right after you eat Products with real ginger are good for nausea, like ginger ale and ginger hard candy Make sure it says made with real ginger! Sucking on sour candy like lemon heads is also good for nausea If your prenatal vitamins make you nauseated, take them at night so you will sleep through the nausea Sea Bands If you feel like you need medicine for the nausea & vomiting please let us know If you are unable to keep any fluids or food down please let us know   Constipation Drink plenty of fluid, preferably water, throughout the day Eat foods high in fiber such as fruits, vegetables, and grains Exercise, such as walking, is a good way to keep your bowels regular Drink warm fluids, especially warm prune juice, or decaf coffee Eat a 1/2 cup of real oatmeal (not instant), 1/2 cup applesauce, and 1/2-1 cup warm prune juice every day If needed, you may take Colace (docusate sodium) stool softener  once or twice a day to help keep the stool soft.  If you still are having problems with constipation, you may take Miralax once daily as needed to help keep your bowels regular.   Home Blood Pressure Monitoring for Patients   Your provider has recommended that you check your blood pressure (BP) at least once a week at home. If you do not have a blood pressure cuff at home, one will be provided for you. Contact your provider if you have not received your monitor within 1 week.   Helpful Tips for Accurate Home Blood Pressure Checks  Don't smoke, exercise, or drink caffeine 30 minutes before checking your BP Use the restroom before checking your BP (a full bladder can raise your pressure) Relax in a comfortable upright chair Feet on the ground Left arm resting comfortably on a flat surface at the level of your heart Legs uncrossed Back supported Sit quietly and don't talk Place the cuff on your bare arm Adjust snuggly, so that only two fingertips can fit between your skin and the top of the cuff Check 2 readings separated by at least one minute Keep a log of your BP readings For a visual, please reference this diagram: http://ccnc.care/bpdiagram  Provider Name: [facility]     Phone: [phone]  Zone 1: ALL CLEAR  Continue to monitor your symptoms:  BP reading is less than 140 (top number) or less than 90 (bottom   number)  No right upper stomach pain No headaches or seeing spots No feeling nauseated or throwing up No swelling in face and hands  Zone 2: CAUTION Call your doctor's office for any of the following:  BP reading is greater than 140 (top number) or greater than 90 (bottom number)  Stomach pain under your ribs in the middle or right side Headaches or seeing spots Feeling nauseated or throwing up Swelling in face and hands  Zone 3: EMERGENCY  Seek immediate medical care if you have any of the following:  BP reading is greater than160 (top number) or greater than  110 (bottom number) Severe headaches not improving with Tylenol Serious difficulty catching your breath Any worsening symptoms from Zone 2    First Trimester of Pregnancy The first trimester of pregnancy is from week 1 until the end of week 12 (months 1 through 3). A week after a sperm fertilizes an egg, the egg will implant on the wall of the uterus. This embryo will begin to develop into a baby. Genes from you and your partner are forming the baby. The female genes determine whether the baby is a boy or a girl. At 6-8 weeks, the eyes and face are formed, and the heartbeat can be seen on ultrasound. At the end of 12 weeks, all the baby's organs are formed.  Now that you are pregnant, you will want to do everything you can to have a healthy baby. Two of the most important things are to get good prenatal care and to follow your health care provider's instructions. Prenatal care is all the medical care you receive before the baby's birth. This care will help prevent, find, and treat any problems during the pregnancy and childbirth. BODY CHANGES Your body goes through many changes during pregnancy. The changes vary from woman to woman.  You may gain or lose a couple of pounds at first. You may feel sick to your stomach (nauseous) and throw up (vomit). If the vomiting is uncontrollable, call your health care provider. You may tire easily. You may develop headaches that can be relieved by medicines approved by your health care provider. You may urinate more often. Painful urination may mean you have a bladder infection. You may develop heartburn as a result of your pregnancy. You may develop constipation because certain hormones are causing the muscles that push waste through your intestines to slow down. You may develop hemorrhoids or swollen, bulging veins (varicose veins). Your breasts may begin to grow larger and become tender. Your nipples may stick out more, and the tissue that surrounds them  (areola) may become darker. Your gums may bleed and may be sensitive to brushing and flossing. Dark spots or blotches (chloasma, mask of pregnancy) may develop on your face. This will likely fade after the baby is born. Your menstrual periods will stop. You may have a loss of appetite. You may develop cravings for certain kinds of food. You may have changes in your emotions from day to day, such as being excited to be pregnant or being concerned that something may go wrong with the pregnancy and baby. You may have more vivid and strange dreams. You may have changes in your hair. These can include thickening of your hair, rapid growth, and changes in texture. Some women also have hair loss during or after pregnancy, or hair that feels dry or thin. Your hair will most likely return to normal after your baby is born. WHAT TO EXPECT AT YOUR PRENATAL  VISITS During a routine prenatal visit: You will be weighed to make sure you and the baby are growing normally. Your blood pressure will be taken. Your abdomen will be measured to track your baby's growth. The fetal heartbeat will be listened to starting around week 10 or 12 of your pregnancy. Test results from any previous visits will be discussed. Your health care provider may ask you: How you are feeling. If you are feeling the baby move. If you have had any abnormal symptoms, such as leaking fluid, bleeding, severe headaches, or abdominal cramping. If you have any questions. Other tests that may be performed during your first trimester include: Blood tests to find your blood type and to check for the presence of any previous infections. They will also be used to check for low iron levels (anemia) and Rh antibodies. Later in the pregnancy, blood tests for diabetes will be done along with other tests if problems develop. Urine tests to check for infections, diabetes, or protein in the urine. An ultrasound to confirm the proper growth and development  of the baby. An amniocentesis to check for possible genetic problems. Fetal screens for spina bifida and Down syndrome. You may need other tests to make sure you and the baby are doing well. HOME CARE INSTRUCTIONS  Medicines Follow your health care provider's instructions regarding medicine use. Specific medicines may be either safe or unsafe to take during pregnancy. Take your prenatal vitamins as directed. If you develop constipation, try taking a stool softener if your health care provider approves. Diet Eat regular, well-balanced meals. Choose a variety of foods, such as meat or vegetable-based protein, fish, milk and low-fat dairy products, vegetables, fruits, and whole grain breads and cereals. Your health care provider will help you determine the amount of weight gain that is right for you. Avoid raw meat and uncooked cheese. These carry germs that can cause birth defects in the baby. Eating four or five small meals rather than three large meals a day may help relieve nausea and vomiting. If you start to feel nauseous, eating a few soda crackers can be helpful. Drinking liquids between meals instead of during meals also seems to help nausea and vomiting. If you develop constipation, eat more high-fiber foods, such as fresh vegetables or fruit and whole grains. Drink enough fluids to keep your urine clear or pale yellow. Activity and Exercise Exercise only as directed by your health care provider. Exercising will help you: Control your weight. Stay in shape. Be prepared for labor and delivery. Experiencing pain or cramping in the lower abdomen or low back is a good sign that you should stop exercising. Check with your health care provider before continuing normal exercises. Try to avoid standing for long periods of time. Move your legs often if you must stand in one place for a long time. Avoid heavy lifting. Wear low-heeled shoes, and practice good posture. You may continue to have sex  unless your health care provider directs you otherwise. Relief of Pain or Discomfort Wear a good support bra for breast tenderness.   Take warm sitz baths to soothe any pain or discomfort caused by hemorrhoids. Use hemorrhoid cream if your health care provider approves.   Rest with your legs elevated if you have leg cramps or low back pain. If you develop varicose veins in your legs, wear support hose. Elevate your feet for 15 minutes, 3-4 times a day. Limit salt in your diet. Prenatal Care Schedule your prenatal visits by the  twelfth week of pregnancy. They are usually scheduled monthly at first, then more often in the last 2 months before delivery. Write down your questions. Take them to your prenatal visits. Keep all your prenatal visits as directed by your health care provider. Safety Wear your seat belt at all times when driving. Make a list of emergency phone numbers, including numbers for family, friends, the hospital, and police and fire departments. General Tips Ask your health care provider for a referral to a local prenatal education class. Begin classes no later than at the beginning of month 6 of your pregnancy. Ask for help if you have counseling or nutritional needs during pregnancy. Your health care provider can offer advice or refer you to specialists for help with various needs. Do not use hot tubs, steam rooms, or saunas. Do not douche or use tampons or scented sanitary pads. Do not cross your legs for long periods of time. Avoid cat litter boxes and soil used by cats. These carry germs that can cause birth defects in the baby and possibly loss of the fetus by miscarriage or stillbirth. Avoid all smoking, herbs, alcohol, and medicines not prescribed by your health care provider. Chemicals in these affect the formation and growth of the baby. Schedule a dentist appointment. At home, brush your teeth with a soft toothbrush and be gentle when you floss. SEEK MEDICAL CARE IF:   You have dizziness. You have mild pelvic cramps, pelvic pressure, or nagging pain in the abdominal area. You have persistent nausea, vomiting, or diarrhea. You have a bad smelling vaginal discharge. You have pain with urination. You notice increased swelling in your face, hands, legs, or ankles. SEEK IMMEDIATE MEDICAL CARE IF:  You have a fever. You are leaking fluid from your vagina. You have spotting or bleeding from your vagina. You have severe abdominal cramping or pain. You have rapid weight gain or loss. You vomit blood or material that looks like coffee grounds. You are exposed to [place] measles and have never had them. You are exposed to fifth disease or chickenpox. You develop a severe headache. You have shortness of breath. You have any kind of trauma, such as from a fall or a car accident. Document Released: 02/27/2001 Document Revised: 07/20/2013 Document Reviewed: 01/13/2013 [facility] Patient Information 2015 [place], [place]. This information is not intended to replace advice given to you by your health care provider. Make sure you discuss any questions you have with your health care provider.

## 2021-09-02 LAB — SPECIMEN STATUS REPORT

## 2021-09-03 LAB — URINE CULTURE

## 2021-09-04 LAB — SPECIMEN STATUS REPORT

## 2021-09-04 LAB — HGB A1C W/O EAG: Hgb A1c MFr Bld: 5.4 % (ref 4.8–5.6)

## 2021-09-05 LAB — CYTOLOGY - PAP
Chlamydia: NEGATIVE
Comment: NEGATIVE
Comment: NEGATIVE
Comment: NORMAL
Diagnosis: NEGATIVE
High risk HPV: NEGATIVE
Neisseria Gonorrhea: NEGATIVE

## 2021-09-07 ENCOUNTER — Other Ambulatory Visit: Payer: Self-pay | Admitting: Advanced Practice Midwife

## 2021-09-07 DIAGNOSIS — O23592 Infection of other part of genital tract in pregnancy, second trimester: Secondary | ICD-10-CM

## 2021-09-07 DIAGNOSIS — Z3480 Encounter for supervision of other normal pregnancy, unspecified trimester: Secondary | ICD-10-CM

## 2021-09-07 DIAGNOSIS — A5901 Trichomonal vulvovaginitis: Secondary | ICD-10-CM | POA: Insufficient documentation

## 2021-09-07 MED ORDER — METRONIDAZOLE 500 MG PO TABS: 500.0000 mg | ORAL_TABLET | Freq: Two times a day (BID) | ORAL | 0 refills | Status: DC

## 2021-09-18 ENCOUNTER — Encounter: Payer: Self-pay | Admitting: Women's Health

## 2021-09-18 DIAGNOSIS — D563 Thalassemia minor: Secondary | ICD-10-CM | POA: Insufficient documentation

## 2021-09-22 ENCOUNTER — Encounter: Payer: Self-pay | Admitting: Obstetrics & Gynecology

## 2021-09-22 ENCOUNTER — Ambulatory Visit (INDEPENDENT_AMBULATORY_CARE_PROVIDER_SITE_OTHER): Payer: Medicaid Other | Admitting: Obstetrics & Gynecology

## 2021-09-22 VITALS — BP 105/64 | HR 74 | Wt 282.2 lb

## 2021-09-22 DIAGNOSIS — Z3480 Encounter for supervision of other normal pregnancy, unspecified trimester: Secondary | ICD-10-CM

## 2021-09-22 DIAGNOSIS — Z1379 Encounter for other screening for genetic and chromosomal anomalies: Secondary | ICD-10-CM

## 2021-09-22 NOTE — Progress Notes (Signed)
   LOW-RISK PREGNANCY VISIT Patient name: Lindsay James MRN [id number]  Date of birth: 11-09-1990 Chief Complaint:   Routine Prenatal Visit and Low Risk Gestation (2nd IT/ having headache at time)  History of Present Illness:   Lindsay James is a 31 y.o. G78P1011 female at [redacted]w[redacted]d with an Estimated Date of Delivery: 03/08/22 being seen today for ongoing management of a low-risk pregnancy.     09/01/2021   10:03 AM 01/10/2021    8:26 AM 02/15/2020    2:50 PM 01/26/2020    9:40 AM 01/08/2020   11:54 AM  Depression screen PHQ 2/9  Decreased Interest 3 0 0 3 2  Down, Depressed, Hopeless 3 0 0 2 2  PHQ - 2 Score 6 0 0 5 4  Altered sleeping 3  1 3 2   Tired, decreased energy 3  1 1 2   Change in appetite 2  1 3 2   Feeling bad or failure about yourself  0  1 2 2   Trouble concentrating 2  0 0 2  Moving slowly or fidgety/restless 3  1 0 2  Suicidal thoughts 0  0 0 0  PHQ-9 Score 19  5 14 16     Today she reports no complaints. Contractions: Not present.  .   . denies leaking of fluid. Review of Systems:   Pertinent items are noted in HPI Denies abnormal vaginal discharge w/ itching/odor/irritation, headaches, visual changes, shortness of breath, chest pain, abdominal pain, severe nausea/vomiting, or problems with urination or bowel movements unless otherwise stated above. Pertinent History Reviewed:  Reviewed past medical,surgical, social, obstetrical and family history.  Reviewed problem list, medications and allergies. Physical Assessment:   Vitals:   09/22/21 1034  BP: 105/64  Pulse: 74  Weight: 282 lb 3.2 oz (128 kg)  Body mass index is 48.44 kg/m.        Physical Examination:   General appearance: Well appearing, and in no distress  Mental status: Alert, oriented to person, place, and time  Skin: Warm & dry  Cardiovascular: Normal heart rate noted  Respiratory: Normal respiratory effort, no distress  Abdomen: Soft, gravid, nontender  Pelvic: Cervical exam deferred          Extremities: Edema: None  Fetal Status: Fetal Heart Rate (bpm): 146        Chaperone: n/a    No results found for this or any previous visit (from the past 24 hour(s)).  Assessment & Plan:  1) Low-risk pregnancy G3P1011 at [redacted]w[redacted]d with an Estimated Date of Delivery: 03/08/22      Meds: No orders of the defined types were placed in this encounter.  Labs/procedures today: 2nd IT  Plan:  Continue routine obstetrical care  Next visit: prefers in person    Reviewed: Term labor symptoms and general obstetric precautions including but not limited to vaginal bleeding, contractions, leaking of fluid and fetal movement were reviewed in detail with the patient.  All questions were answered. Has home bp cuff. Rx faxed to . Check bp weekly, let know if >140/90.   Follow-up: Return for keep scheduled.  Orders Placed This Encounter  Procedures   INTEGRATED 2    , MD 09/22/2021 11:48 AM

## 2021-09-25 LAB — INTEGRATED 2
AFP MoM: 1.2
Alpha-Fetoprotein: 24.7 ng/mL
Crown Rump Length: 68.6 mm
DIA MoM: 1.3
DIA Value: 141.6 pg/mL
Estriol, Unconjugated: 0.76 ng/mL
Gest. Age on Collection Date: 12.9 weeks
Gestational Age: 16.1 weeks
Maternal Age at EDD: 30.4 yr
Nuchal Translucency (NT): 1.9 mm
Nuchal Translucency MoM: 1.22
Number of Fetuses: 1
PAPP-A MoM: 2.12
PAPP-A Value: 1100 ng/mL
Test Results:: NEGATIVE
Weight: 282 [lb_av]
Weight: 282 [lb_av]
hCG MoM: 1.65
hCG Value: 35.9 IU/mL
uE3 MoM: 0.99

## 2021-10-19 ENCOUNTER — Other Ambulatory Visit: Payer: Self-pay | Admitting: Obstetrics & Gynecology

## 2021-10-19 DIAGNOSIS — Z363 Encounter for antenatal screening for malformations: Secondary | ICD-10-CM

## 2021-10-20 ENCOUNTER — Ambulatory Visit (INDEPENDENT_AMBULATORY_CARE_PROVIDER_SITE_OTHER): Payer: Medicaid Other

## 2021-10-20 ENCOUNTER — Ambulatory Visit (INDEPENDENT_AMBULATORY_CARE_PROVIDER_SITE_OTHER): Payer: Medicaid Other | Admitting: Advanced Practice Midwife

## 2021-10-20 VITALS — BP 117/73 | HR 72 | Wt 296.0 lb

## 2021-10-20 DIAGNOSIS — Z3480 Encounter for supervision of other normal pregnancy, unspecified trimester: Secondary | ICD-10-CM

## 2021-10-20 DIAGNOSIS — O99210 Obesity complicating pregnancy, unspecified trimester: Secondary | ICD-10-CM | POA: Insufficient documentation

## 2021-10-20 DIAGNOSIS — Z363 Encounter for antenatal screening for malformations: Secondary | ICD-10-CM

## 2021-10-20 DIAGNOSIS — Z3A20 20 weeks gestation of pregnancy: Secondary | ICD-10-CM

## 2021-10-20 DIAGNOSIS — O99212 Obesity complicating pregnancy, second trimester: Secondary | ICD-10-CM

## 2021-10-20 DIAGNOSIS — A5901 Trichomonal vulvovaginitis: Secondary | ICD-10-CM

## 2021-10-20 DIAGNOSIS — O23592 Infection of other part of genital tract in pregnancy, second trimester: Secondary | ICD-10-CM

## 2021-10-20 DIAGNOSIS — O09299 Supervision of pregnancy with other poor reproductive or obstetric history, unspecified trimester: Secondary | ICD-10-CM

## 2021-10-20 NOTE — Progress Notes (Signed)
[place] 20+1 wks,cephalic,anterior placenta gr 0,normal ovaries,cx 2.9 cm,SVP of fluid 4.5 cm,FHR 148 bpm,EFW 358 g 65%,anatomy complete,no obvious abnormalities

## 2021-10-20 NOTE — Progress Notes (Signed)
   LOW-RISK PREGNANCY VISIT Patient name: Lindsay James MRN [id number]  Date of birth: 09/15/1990 Chief Complaint:   Routine Prenatal Visit  History of Present Illness:   Lindsay James is a 31 y.o. G38P1011 female at [redacted]w[redacted]d with an Estimated Date of Delivery: 03/08/22 being seen today for ongoing management of a low-risk pregnancy.  Today she reports  having some LBP/bilat side pain . Contractions: Not present. Vag. Bleeding: None.   . denies leaking of fluid. Review of Systems:   Pertinent items are noted in HPI Denies abnormal vaginal discharge w/ itching/odor/irritation, headaches, visual changes, shortness of breath, chest pain, abdominal pain, severe nausea/vomiting, or problems with urination or bowel movements unless otherwise stated above. Pertinent History Reviewed:  Reviewed past medical,surgical, social, obstetrical and family history.  Reviewed problem list, medications and allergies. Physical Assessment:   Vitals:   10/20/21 0920  BP: 117/73  Pulse: 72  There is no height or weight on file to calculate BMI.        Physical Examination:   General appearance: Well appearing, and in no distress  Mental status: Alert, oriented to person, place, and time  Skin: Warm & dry  Cardiovascular: Normal heart rate noted  Respiratory: Normal respiratory effort, no distress  Abdomen: Soft, gravid, nontender  Pelvic: Cervical exam deferred         Extremities: Edema: None  Fetal Status: Fetal Heart Rate (bpm): 148 u/s        Anatomy u/s: [place] 20+1 wks,cephalic,anterior placenta gr 0,normal ovaries,cx 2.9 cm,SVP of fluid 4.5 cm,FHR 148 bpm,EFW 358 g 65%,anatomy complete,no obvious abnormalities  No results found for this or any previous visit (from the past 24 hour(s)).  Assessment & Plan:  1) Low-risk pregnancy G3P1011 at [redacted]w[redacted]d with an Estimated Date of Delivery: 03/08/22   2) LBP, rec maternity support belt, heating pad  3) Recent +trich, POC today  4) Hx short cx prev  preg, 2.9cm today   Meds: No orders of the defined types were placed in this encounter.  Labs/procedures today: anatomy scan; trich POC  Plan:  Continue routine obstetrical care   Reviewed: Preterm labor symptoms and general obstetric precautions including but not limited to vaginal bleeding, contractions, leaking of fluid and fetal movement were reviewed in detail with the patient.  All questions were answered. Didn't ask about home bp cuff. Check bp weekly, let us know if >140/90.   Follow-up: No follow-ups on file.  Orders Placed This Encounter  Procedures   Trichomonas vaginalis, RNA   Arabella Merles [facility] 10/20/2021 9:31 AM

## 2021-10-20 NOTE — Patient Instructions (Signed)
Lindsay James, thank you for choosing our office today! We appreciate the opportunity to meet your healthcare needs. You may receive a short survey by mail, e-mail, or through MyChart. If you are happy with your care we would appreciate if you could take just a few minutes to complete the survey questions. We read all of your comments and take your feedback very seriously. Thank you again for choosing our office.  [facility] Team at [facility] [place] ([address] [place], [place] [address]) Entrance C, located off of [address] Free 24/7 valet parking  Go to [email or web address] to register for FREE online childbirth classes  Call the office ([phone]) or go to [facility] if: You begin to severe cramping Your water breaks.  Sometimes it is a big gush of fluid, sometimes it is just a trickle that keeps getting your panties wet or running down your legs You have vaginal bleeding.  It is normal to have a small amount of spotting if your cervix was checked.   [place] Pediatricians/Family Doctors [facility] ([facility]): [address], [phone]           [facility]: [address], [phone]                [facility] ([facility]): [address], [phone] (call to ask if accepting patients) [facility]: [address], [place], [phone]    [place] Pediatricians/Family Doctors [facility] ([facility]): [address], [phone] [facility]: [address], [phone] [facility]: [address], [phone]  [place] [facility]  [facility] ([facility]): [phone] [facility]: [address], [phone]   [place] [facility]: [address], [phone]  [place] [facility]  [facility]: [address], [phone]  Home Blood Pressure Monitoring for Patients   Your provider has recommended that you check your blood pressure (BP) at least once a week at home. If you do not have a blood pressure cuff at home, one will be provided for you. Contact your provider if you have not received your monitor within 1 week.   Helpful Tips for Accurate Home Blood Pressure Checks  Don't smoke, exercise, or drink caffeine 30 minutes before checking your BP Use the restroom before checking your BP (a full bladder can raise your pressure) Relax in a comfortable upright chair Feet on the ground Left arm resting comfortably on a flat surface at the level of your heart Legs uncrossed Back supported Sit quietly and don't talk Place the cuff on your bare arm Adjust snuggly, so that only two fingertips can fit between your skin and the top of the cuff Check 2 readings separated by at least one minute Keep a log of your BP readings For a visual, please reference this diagram: http://[facility].care/bpdiagram  Provider Name: [facility]     Phone: [phone]  Zone 1: ALL CLEAR  Continue to monitor your symptoms:  BP reading is less than 140 (top number) or less than 90 (bottom number)  No right upper stomach pain No headaches or seeing spots No feeling nauseated or throwing up No swelling in face and hands  Zone 2: CAUTION Call your doctor's office for any of the following:  BP reading is greater than 140 (top number) or greater than   90 (bottom number)  Stomach pain under your ribs in the middle or right side Headaches or seeing spots Feeling nauseated or throwing up Swelling in face and hands  Zone 3: EMERGENCY  Seek immediate medical care if you have any of the following:  BP reading is greater than160 (top number) or greater than 110 (bottom number) Severe headaches not improving with Tylenol Serious difficulty catching your breath Any worsening symptoms from  Zone 2     Second Trimester of Pregnancy The second trimester is from week 14 through week 27 (months 4 through 6). The second trimester is often a time when you feel your best. Your body has adjusted to being pregnant, and you begin to feel better physically. Usually, morning sickness has lessened or quit completely, you may have more energy, and you may have an increase in appetite. The second trimester is also a time when the fetus is growing rapidly. At the end of the sixth month, the fetus is about 9 inches long and weighs about 1 pounds. You will likely begin to feel the baby move (quickening) between 16 and 20 weeks of pregnancy. Body changes during your second trimester Your body continues to go through many changes during your second trimester. The changes vary from woman to woman. Your weight will continue to increase. You will notice your lower abdomen bulging out. You may begin to get stretch marks on your hips, abdomen, and breasts. You may develop headaches that can be relieved by medicines. The medicines should be approved by your health care provider. You may urinate more often because the fetus is pressing on your bladder. You may develop or continue to have heartburn as a result of your pregnancy. You may develop constipation because certain hormones are causing the muscles that push waste through your intestines to slow down. You may develop hemorrhoids or swollen, bulging veins (varicose veins). You may have back pain. This is caused by: Weight gain. Pregnancy hormones that are relaxing the joints in your pelvis. A shift in weight and the muscles that support your balance. Your breasts will continue to grow and they will continue to become tender. Your gums may bleed and may be sensitive to brushing and flossing. Dark spots or blotches (chloasma, mask of pregnancy) may develop on your face. This will likely fade after the baby is born. A dark line from your belly button to  the pubic area (linea nigra) may appear. This will likely fade after the baby is born. You may have changes in your hair. These can include thickening of your hair, rapid growth, and changes in texture. Some women also have hair loss during or after pregnancy, or hair that feels dry or thin. Your hair will most likely return to normal after your baby is born.  What to expect at prenatal visits During a routine prenatal visit: You will be weighed to make sure you and the fetus are growing normally. Your blood pressure will be taken. Your abdomen will be measured to track your baby's growth. The fetal heartbeat will be listened to. Any test results from the previous visit will be discussed.  Your health care provider may ask you: How you are feeling. If you are feeling the baby move. If you have had any abnormal symptoms, such as leaking fluid, bleeding, severe headaches, or abdominal cramping. If you are using any tobacco products, including cigarettes, chewing tobacco, and electronic cigarettes. If you have any questions.  Other tests that may be performed during   your second trimester include: Blood tests that check for: Low iron levels (anemia). High blood sugar that affects pregnant women (gestational diabetes) between 24 and 28 weeks. Rh antibodies. This is to check for a protein on red blood cells (Rh factor). Urine tests to check for infections, diabetes, or protein in the urine. An ultrasound to confirm the proper growth and development of the baby. An amniocentesis to check for possible genetic problems. Fetal screens for spina bifida and Down syndrome. HIV (human immunodeficiency virus) testing. Routine prenatal testing includes screening for HIV, unless you choose not to have this test.  Follow these instructions at home: Medicines Follow your health care provider's instructions regarding medicine use. Specific medicines may be either safe or unsafe to take during  pregnancy. Take a prenatal vitamin that contains at least 600 micrograms (mcg) of folic acid. If you develop constipation, try taking a stool softener if your health care provider approves. Eating and drinking Eat a balanced diet that includes fresh fruits and vegetables, whole grains, good sources of protein such as meat, eggs, or tofu, and low-fat dairy. Your health care provider will help you determine the amount of weight gain that is right for you. Avoid raw meat and uncooked cheese. These carry germs that can cause birth defects in the baby. If you have low calcium intake from food, talk to your health care provider about whether you should take a daily calcium supplement. Limit foods that are high in fat and processed sugars, such as fried and sweet foods. To prevent constipation: Drink enough fluid to keep your urine clear or pale yellow. Eat foods that are high in fiber, such as fresh fruits and vegetables, whole grains, and beans. Activity Exercise only as directed by your health care provider. Most women can continue their usual exercise routine during pregnancy. Try to exercise for 30 minutes at least 5 days a week. Stop exercising if you experience uterine contractions. Avoid heavy lifting, wear low heel shoes, and practice good posture. A sexual relationship may be continued unless your health care provider directs you otherwise. Relieving pain and discomfort Wear a good support bra to prevent discomfort from breast tenderness. Take warm sitz baths to soothe any pain or discomfort caused by hemorrhoids. Use hemorrhoid cream if your health care provider approves. Rest with your legs elevated if you have leg cramps or low back pain. If you develop varicose veins, wear support hose. Elevate your feet for 15 minutes, 3-4 times a day. Limit salt in your diet. Prenatal Care Write down your questions. Take them to your prenatal visits. Keep all your prenatal visits as told by your health  care provider. This is important. Safety Wear your seat belt at all times when driving. Make a list of emergency phone numbers, including numbers for family, friends, the hospital, and police and fire departments. General instructions Ask your health care provider for a referral to a local prenatal education class. Begin classes no later than the beginning of month 6 of your pregnancy. Ask for help if you have counseling or nutritional needs during pregnancy. Your health care provider can offer advice or refer you to specialists for help with various needs. Do not use hot tubs, steam rooms, or saunas. Do not douche or use tampons or scented sanitary pads. Do not cross your legs for long periods of time. Avoid cat litter boxes and soil used by cats. These carry germs that can cause birth defects in the baby and possibly loss of the   fetus by miscarriage or stillbirth. Avoid all smoking, herbs, alcohol, and unprescribed drugs. Chemicals in these products can affect the formation and growth of the baby. Do not use any products that contain nicotine or tobacco, such as cigarettes and e-cigarettes. If you need help quitting, ask your health care provider. Visit your dentist if you have not gone yet during your pregnancy. Use a soft toothbrush to brush your teeth and be gentle when you floss. Contact a health care provider if: You have dizziness. You have mild pelvic cramps, pelvic pressure, or nagging pain in the abdominal area. You have persistent nausea, vomiting, or diarrhea. You have a bad smelling vaginal discharge. You have pain when you urinate. Get help right away if: You have a fever. You are leaking fluid from your vagina. You have spotting or bleeding from your vagina. You have severe abdominal cramping or pain. You have rapid weight gain or weight loss. You have shortness of breath with chest pain. You notice sudden or extreme swelling of your face, hands, ankles, feet, or legs. You  have not felt your baby move in over an hour. You have severe headaches that do not go away when you take medicine. You have vision changes. Summary The second trimester is from week 14 through week 27 (months 4 through 6). It is also a time when the fetus is growing rapidly. Your body goes through many changes during pregnancy. The changes vary from woman to woman. Avoid all smoking, herbs, alcohol, and unprescribed drugs. These chemicals affect the formation and growth your baby. Do not use any tobacco products, such as cigarettes, chewing tobacco, and e-cigarettes. If you need help quitting, ask your health care provider. Contact your health care provider if you have any questions. Keep all prenatal visits as told by your health care provider. This is important. This information is not intended to replace advice given to you by your health care provider. Make sure you discuss any questions you have with your health care provider. Document Released: 02/27/2001 Document Revised: 08/11/2015 Document Reviewed: 05/06/2012 Elsevier Interactive Patient Education  2017 [employer]  

## 2021-10-24 ENCOUNTER — Other Ambulatory Visit: Payer: Self-pay | Admitting: Advanced Practice Midwife

## 2021-10-24 DIAGNOSIS — A599 Trichomoniasis, unspecified: Secondary | ICD-10-CM

## 2021-10-24 LAB — TRICHOMONAS VAGINALIS, PROBE AMP: Trich vag by NAA: POSITIVE — AB

## 2021-10-24 MED ORDER — METRONIDAZOLE 500 MG PO TABS: 2000.0000 mg | ORAL_TABLET | Freq: Once | ORAL | 0 refills | Status: AC

## 2021-11-17 ENCOUNTER — Encounter: Payer: Self-pay | Admitting: Advanced Practice Midwife

## 2021-11-17 ENCOUNTER — Other Ambulatory Visit (HOSPITAL_COMMUNITY)
Admission: RE | Admit: 2021-11-17 | Discharge: 2021-11-17 | Disposition: A | Discharge status: Home / Self Care | Payer: Medicaid Other | Source: Ambulatory Visit | Attending: Advanced Practice Midwife | Admitting: Advanced Practice Midwife

## 2021-11-17 ENCOUNTER — Ambulatory Visit (INDEPENDENT_AMBULATORY_CARE_PROVIDER_SITE_OTHER): Payer: Medicaid Other | Admitting: Advanced Practice Midwife

## 2021-11-17 VITALS — BP 111/69 | HR 72 | Wt 310.0 lb

## 2021-11-17 DIAGNOSIS — O99212 Obesity complicating pregnancy, second trimester: Secondary | ICD-10-CM

## 2021-11-17 DIAGNOSIS — Z3A24 24 weeks gestation of pregnancy: Secondary | ICD-10-CM

## 2021-11-17 DIAGNOSIS — O23592 Infection of other part of genital tract in pregnancy, second trimester: Secondary | ICD-10-CM

## 2021-11-17 DIAGNOSIS — A5901 Trichomonal vulvovaginitis: Secondary | ICD-10-CM | POA: Insufficient documentation

## 2021-11-17 DIAGNOSIS — Z3480 Encounter for supervision of other normal pregnancy, unspecified trimester: Secondary | ICD-10-CM

## 2021-11-17 DIAGNOSIS — O09299 Supervision of pregnancy with other poor reproductive or obstetric history, unspecified trimester: Secondary | ICD-10-CM

## 2021-11-17 NOTE — Progress Notes (Addendum)
   LOW-RISK PREGNANCY VISIT Patient name: Lindsay James MRN [id number]  Date of birth: 1990/09/17 Chief Complaint:   Routine Prenatal Visit  History of Present Illness:   Lindsay James is a 31 y.o. G78P1011 female at [redacted]w[redacted]d with an Estimated Date of Delivery: 03/08/22 being seen today for ongoing management of a low-risk pregnancy.  Today she reports  still w hemorrhoids; completed Flagyl dosing and hasn't had sex- requesting partner treatment . Contractions: Not present. Vag. Bleeding: None.  Movement: Present. denies leaking of fluid. Review of Systems:   Pertinent items are noted in HPI Denies abnormal vaginal discharge w/ itching/odor/irritation, headaches, visual changes, shortness of breath, chest pain, abdominal pain, severe nausea/vomiting, or problems with urination or bowel movements unless otherwise stated above. Pertinent History Reviewed:  Reviewed past medical,surgical, social, obstetrical and family history.  Reviewed problem list, medications and allergies. Physical Assessment:   Vitals:   11/17/21 0857  BP: 111/69  Pulse: 72  Weight: (!) 310 lb (140.6 kg)  Body mass index is 53.21 kg/m.        Physical Examination:   General appearance: Well appearing, and in no distress  Mental status: Alert, oriented to person, place, and time  Skin: Warm & dry  Cardiovascular: Normal heart rate noted  Respiratory: Normal respiratory effort, no distress  Abdomen: Soft, gravid, nontender  Pelvic: Cervical exam deferred   Rectal: 2-3 sm hemorrhoids noted, non thrombosed        Extremities: Edema: None  Fetal Status: Fetal Heart Rate (bpm): 155   Movement: Present    No results found for this or any previous visit (from the past 24 hour(s)).  Assessment & Plan:  1) Low-risk pregnancy G3P1011 at [redacted]w[redacted]d with an Estimated Date of Delivery: 03/08/22   2) Trich+ x 2, was able to take 2gm Flagyl a few weeks ago and hasn't had sex; POC today; EPT called in for Thana Farr  to [employer]- EPT info sent to patient via MyChart  3) Hemorrhoids, tips given to decrease constipation as well as OTC meds to try   Meds: No orders of the defined types were placed in this encounter.  Labs/procedures today: trich POC  Plan:  Continue routine obstetrical care   Reviewed: Preterm labor symptoms and general obstetric precautions including but not limited to vaginal bleeding, contractions, leaking of fluid and fetal movement were reviewed in detail with the patient.  All questions were answered. Didn't ask about home bp cuff.  Check bp weekly, let us know if >140/90.   Follow-up: Return in about 3 weeks (around 12/08/2021) for PN2, LROB, in person.  No orders of the defined types were placed in this encounter.  Arabella Merles CNM 11/17/2021 9:23 AM

## 2021-11-17 NOTE — Addendum Note (Signed)
Addended by: Moss Mc on: 11/17/2021 11:33 AM   Modules accepted: Orders

## 2021-11-17 NOTE — Patient Instructions (Signed)
Lindsay James, I greatly value your feedback.  If you receive a survey following your visit with [place] today, we appreciate you taking the time to fill it out.  Thanks, Lindsay James, CNM   You will have your sugar test next visit.  Please do not eat or drink anything after midnight the night before you come, not even water.  You will be here for at least two hours.  Please make an appointment online for the bloodwork at [email or web address] for 8:30am (or as close to this as possible). Make sure you select the [facility] service center. The day of the appointment, check in with our office first, then you will go to [facility] to start the sugar test.    [facility] HAS MOVED!!! It is now [facility] at [facility] ([address] [place], [place] [address]) Entrance C, located off of [address] valet parking  Go to [email or web address] to register for FREE online childbirth classes   Call the office [phone]) or go to [facility] if: You begin to have strong, frequent contractions Your water breaks.  Sometimes it is a big gush of fluid, sometimes it is just a trickle that keeps getting your panties wet or running down your legs You have vaginal bleeding.  It is normal to have a small amount of spotting if your cervix was checked.  You don't feel your baby moving like normal.  If you don't, get you something to eat and drink and lay down and focus on feeling your baby move.   If your baby is still not moving like normal, you should call the office or go to [facility].  [place] Pediatricians/Family Doctors: [facility] [phone]           [facility] [phone]                [facility] [phone] (usually not accepting new patients unless you have family there already, you are always welcome to call and ask)      [facility] Department [phone]       [facility] Pediatricians/Family Doctors:  [facility]: [phone] [facility]: [phone] [facility]: [phone]  [facility] Doctors:  [facility]: [phone]  [facility]: [phone]  [facility] Doctors: [facility]: [phone]   Home Blood Pressure Monitoring for Patients   Your provider has recommended that you check your blood pressure (BP) at least once a week at home. If you do not have a blood pressure cuff at home, one will be provided for you. Contact your provider if you have not received your monitor within 1 week.   Helpful Tips for Accurate Home Blood Pressure Checks  Don't smoke, exercise, or drink caffeine 30 minutes before checking your BP Use the restroom before checking your BP (a full bladder can raise your pressure) Relax in a comfortable upright chair Feet on the ground Left arm resting comfortably on a flat surface at the level of your heart Legs uncrossed Back supported Sit quietly and don't talk Place the cuff on your bare arm Adjust snuggly, so that only two fingertips can fit between your skin and the top of the cuff Check 2 readings separated by at least one minute Keep a log of your BP readings For a visual, please reference this diagram: http://ccnc.care/bpdiagram  Provider Name: [facility]     Phone: [phone]  Zone 1: ALL CLEAR  Continue to monitor your symptoms:  BP reading is less than 140 (top number) or less than 90 (bottom number)  No right upper stomach pain No headaches or seeing spots No feeling nauseated or throwing up No swelling in face and hands  Zone 2: CAUTION Call your doctor's office for any of the following:  BP reading is greater than 140 (top number) or greater than 90 (bottom number)  Stomach pain under your ribs in the middle or right side Headaches or seeing spots Feeling nauseated or throwing up Swelling in face and hands  Zone 3: EMERGENCY   Seek immediate medical care if you have any of the following:  BP reading is greater than160 (top number) or greater than 110 (bottom number) Severe headaches not improving with Tylenol Serious difficulty catching your breath Any worsening symptoms from Zone 2   Second Trimester of Pregnancy The second trimester is from week 13 through week 28, months 4 through 6. The second trimester is often a time when you feel your best. Your body has also adjusted to being pregnant, and you begin to feel better physically. Usually, morning sickness has lessened or quit completely, you may have more energy, and you may have an increase in appetite. The second trimester is also a time when the fetus is growing rapidly. At the end of the sixth month, the fetus is about 9 inches long and weighs about 1 pounds. You will likely begin to feel the baby move (quickening) between 18 and 20 weeks of the pregnancy. BODY CHANGES Your body goes through many changes during pregnancy. The changes vary from woman to woman.  Your weight will continue to increase. You will notice your lower abdomen bulging out. You may begin to get stretch marks on your hips, abdomen, and breasts. You may develop headaches that can be relieved by medicines approved by your health care provider. You may urinate more often because the fetus is pressing on your bladder. You may develop or continue to have heartburn as a result of your pregnancy. You may develop constipation because certain hormones are causing the muscles that push waste through your intestines to slow down. You may develop hemorrhoids or swollen, bulging veins (varicose veins). You may have back pain because of the weight gain and pregnancy hormones relaxing your joints between the bones in your pelvis and as a result of a shift in weight and the muscles that support your balance. Your breasts will continue to grow and be tender. Your gums may bleed and may be sensitive to  brushing and flossing. Dark spots or blotches (chloasma, mask of pregnancy) may develop on your face. This will likely fade after the baby is born. A dark line from your belly button to the pubic area (linea nigra) may appear. This will likely fade after the baby is born. You may have changes in your hair. These can include thickening of your hair, rapid growth, and changes in texture. Some women also have hair loss during or after pregnancy, or hair that feels dry or thin. Your hair will most likely return to normal after your baby is born. WHAT TO EXPECT AT YOUR PRENATAL VISITS During a routine prenatal visit: You will be weighed to make sure you and the fetus are growing normally. Your blood pressure will be taken. Your abdomen will be measured to track your baby's growth. The fetal heartbeat will be listened to. Any test results from the previous visit will be discussed. Your health care provider  may ask you: How you are feeling. If you are feeling the baby move. If you have had any abnormal symptoms, such as leaking fluid, bleeding, severe headaches, or abdominal cramping. If you have any questions. Other tests that may be performed during your second trimester include: Blood tests that check for: Low iron levels (anemia). Gestational diabetes (between 24 and 28 weeks). Rh antibodies. Urine tests to check for infections, diabetes, or protein in the urine. An ultrasound to confirm the proper growth and development of the baby. An amniocentesis to check for possible genetic problems. Fetal screens for spina bifida and Down syndrome. HOME CARE INSTRUCTIONS  Avoid all smoking, herbs, alcohol, and unprescribed drugs. These chemicals affect the formation and growth of the baby. Follow your health care provider's instructions regarding medicine use. There are medicines that are either safe or unsafe to take during pregnancy. Exercise only as directed by your health care provider.  Experiencing uterine cramps is a good sign to stop exercising. Continue to eat regular, healthy meals. Wear a good support bra for breast tenderness. Do not use hot tubs, steam rooms, or saunas. Wear your seat belt at all times when driving. Avoid raw meat, uncooked cheese, cat litter boxes, and soil used by cats. These carry germs that can cause birth defects in the baby. Take your prenatal vitamins. Try taking a stool softener (if your health care provider approves) if you develop constipation. Eat more high-fiber foods, such as fresh vegetables or fruit and whole grains. Drink plenty of fluids to keep your urine clear or pale yellow. Take warm sitz baths to soothe any pain or discomfort caused by hemorrhoids. Use hemorrhoid cream if your health care provider approves. If you develop varicose veins, wear support hose. Elevate your feet for 15 minutes, 3-4 times a day. Limit salt in your diet. Avoid heavy lifting, wear low heel shoes, and practice good posture. Rest with your legs elevated if you have leg cramps or low back pain. Visit your dentist if you have not gone yet during your pregnancy. Use a soft toothbrush to brush your teeth and be gentle when you floss. A sexual relationship may be continued unless your health care provider directs you otherwise. Continue to go to all your prenatal visits as directed by your health care provider. SEEK MEDICAL CARE IF:  You have dizziness. You have mild pelvic cramps, pelvic pressure, or nagging pain in the abdominal area. You have persistent nausea, vomiting, or diarrhea. You have a bad smelling vaginal discharge. You have pain with urination. SEEK IMMEDIATE MEDICAL CARE IF:  You have a fever. You are leaking fluid from your vagina. You have spotting or bleeding from your vagina. You have severe abdominal cramping or pain. You have rapid weight gain or loss. You have shortness of breath with chest pain. You notice sudden or extreme swelling  of your face, hands, ankles, feet, or legs. You have not felt your baby move in over an hour. You have severe headaches that do not go away with medicine. You have vision changes. Document Released: 02/27/2001 Document Revised: 03/10/2013 Document Reviewed: 05/06/2012 [facility] Patient Information 2015 [place], [place]. This information is not intended to replace advice given to you by your health care provider. Make sure you discuss any questions you have with your health care provider.

## 2021-11-22 LAB — CERVICOVAGINAL ANCILLARY ONLY
Comment: NEGATIVE
Trichomonas: POSITIVE — AB

## 2021-11-23 ENCOUNTER — Encounter: Payer: Self-pay | Admitting: *Deleted

## 2021-11-24 ENCOUNTER — Telehealth: Payer: Self-pay | Admitting: *Deleted

## 2021-11-24 NOTE — Telephone Encounter (Signed)
Patient informed TOC for trichomonas still positive.  Will send in treatment.  Partner has already been treated.  Advised no sex for 7 days.

## 2021-11-25 ENCOUNTER — Other Ambulatory Visit: Payer: Self-pay | Admitting: Obstetrics & Gynecology

## 2021-11-25 DIAGNOSIS — A5901 Trichomonal vulvovaginitis: Secondary | ICD-10-CM

## 2021-11-25 MED ORDER — METRONIDAZOLE 500 MG PO TABS: 500.0000 mg | ORAL_TABLET | Freq: Two times a day (BID) | ORAL | 0 refills | Status: AC

## 2021-11-25 NOTE — Progress Notes (Signed)
Treatment for Trich ?

## 2021-11-30 ENCOUNTER — Ambulatory Visit (INDEPENDENT_AMBULATORY_CARE_PROVIDER_SITE_OTHER): Payer: Medicaid Other | Admitting: Advanced Practice Midwife

## 2021-11-30 ENCOUNTER — Encounter: Payer: Self-pay | Admitting: Advanced Practice Midwife

## 2021-11-30 ENCOUNTER — Other Ambulatory Visit: Payer: Medicaid Other

## 2021-11-30 VITALS — BP 107/70 | Wt 310.0 lb

## 2021-11-30 DIAGNOSIS — Z131 Encounter for screening for diabetes mellitus: Secondary | ICD-10-CM

## 2021-11-30 DIAGNOSIS — Z3A25 25 weeks gestation of pregnancy: Secondary | ICD-10-CM

## 2021-11-30 DIAGNOSIS — Z3480 Encounter for supervision of other normal pregnancy, unspecified trimester: Secondary | ICD-10-CM

## 2021-11-30 DIAGNOSIS — Z3482 Encounter for supervision of other normal pregnancy, second trimester: Secondary | ICD-10-CM

## 2021-11-30 DIAGNOSIS — O99210 Obesity complicating pregnancy, unspecified trimester: Secondary | ICD-10-CM

## 2021-11-30 DIAGNOSIS — Z3A26 26 weeks gestation of pregnancy: Secondary | ICD-10-CM

## 2021-11-30 NOTE — Progress Notes (Signed)
   LOW-RISK PREGNANCY VISIT Patient name: Lindsay James MRN [id number]  Date of birth: Jan 16, 1991 Chief Complaint:   Routine Prenatal Visit  History of Present Illness:   Lindsay James is a 31 y.o. G73P1011 female at [redacted]w[redacted]d with an Estimated Date of Delivery: 03/08/22 being seen today for ongoing management of a low-risk pregnancy.  Today she reports hasn't taken flagyl yet--waiting on partner to get his meds, thinks it should be next week. Discussed weight, try to go to sugar free soda instead of sugar. . Contractions: Not present. Vag. Bleeding: None.  Movement: Present. denies leaking of fluid. Review of Systems:   Pertinent items are noted in HPI Denies abnormal vaginal discharge w/ itching/odor/irritation, headaches, visual changes, shortness of breath, chest pain, abdominal pain, severe nausea/vomiting, or problems with urination or bowel movements unless otherwise stated above. Pertinent History Reviewed:  Reviewed past medical,surgical, social, obstetrical and family history.  Reviewed problem list, medications and allergies. Physical Assessment:   Vitals:   11/30/21 0902  Weight: (!) 310 lb (140.6 kg)  Body mass index is 53.21 kg/m.        Physical Examination:   General appearance: Well appearing, and in no distress  Mental status: Alert, oriented to person, place, and time  Skin: Warm & dry  Cardiovascular: Normal heart rate noted  Respiratory: Normal respiratory effort, no distress  Abdomen: Soft, gravid, nontender  Pelvic: deferred       Extremities: Edema: None  Fetal Status:     Movement: Present    Chaperone: n/a  No results found for this or any previous visit (from the past 24 hour(s)).  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  Will let us know when she needs POC   Meds: No orders of the defined types were placed in this  encounter.  Labs/procedures today: wet prep  Plan:  Continue routine obstetrical care  Next visit: prefers in person    Reviewed: Preterm labor symptoms and general obstetric precautions including but not limited to vaginal bleeding, contractions, leaking of fluid and fetal movement were reviewed in detail with the patient.  All questions were answered. Has home bp cuff.. Check bp weekly, let us know if >140/90.   Follow-up: Return in about 4 weeks (around 12/28/2021) for LROB.  Future Appointments  Date Time Provider Department Center  11/30/2021 10:10 AM Cresenzo-Dishmon, Scarlette Calico, CNM [facility]    No orders of the defined types were placed in this encounter.  Jacklyn Shell DNP, CNM 11/30/2021 9:26 AM

## 2021-11-30 NOTE — Patient Instructions (Signed)
Lindsay James, I greatly value your feedback.  If you receive a survey following your visit with [place] today, we appreciate you taking the time to fill it out.  Thanks, Cathie Beams, CNM   [facility] HAS MOVED!!! It is now [facility] at [facility] ([address] [place], [place] [address]) Entrance located off of [address] Free 24/7 valet parking   Go to [email or web address] to register for FREE online childbirth classes    Call the office [phone]) or go to [facility] if: You begin to have strong, frequent contractions Your water breaks.  Sometimes it is a big gush of fluid, sometimes it is just a trickle that keeps getting your panties wet or running down your legs You have vaginal bleeding.  It is normal to have a small amount of spotting if your cervix was checked.  You don't feel your baby moving like normal.  If you don't, get you something to eat and drink and lay down and focus on feeling your baby move.  You should feel at least 10 movements in 2 hours.  If you don't, you should call the office or go to [facility].    Tdap Vaccine It is recommended that you get the Tdap vaccine during the third trimester of EACH pregnancy to help protect your baby from getting pertussis (whooping cough) 27-36 weeks is the BEST time to do this so that you can pass the protection on to your baby. During pregnancy is better than after pregnancy, but if you are unable to get it during pregnancy it will be offered at the hospital.  You will be offered this vaccine in the office after 27 weeks. If you do not have health insurance, you can get this vaccine at the health department or your family doctor Everyone who will be around your baby should also be up-to-date on their vaccines. Adults (who are not pregnant) only need 1 dose of Tdap during adulthood.   Third Trimester of Pregnancy The third trimester is from week 29 through week 42, months 7 through 9. The third  trimester is a time when the fetus is growing rapidly. At the end of the ninth month, the fetus is about 20 inches in length and weighs 6-10 pounds.  BODY CHANGES Your body goes through many changes during pregnancy. The changes vary from woman to woman.  Your weight will continue to increase. You can expect to gain 25-35 pounds (11-16 kg) by the end of the pregnancy. You may begin to get stretch marks on your hips, abdomen, and breasts. You may urinate more often because the fetus is moving lower into your pelvis and pressing on your bladder. You may develop or continue to have heartburn as a result of your pregnancy. You may develop constipation because certain hormones are causing the muscles that push waste through your intestines to slow down. You may develop hemorrhoids or swollen, bulging veins (varicose veins). You may have pelvic pain because of the weight gain and pregnancy hormones relaxing your joints between the bones in your pelvis. Backaches may result from overexertion of the muscles supporting your posture. You may have changes in your hair. These can include thickening of your hair, rapid growth, and changes in texture. Some women also have hair loss during or after pregnancy, or hair that feels dry or thin. Your hair will most likely return to normal after your baby is born. Your breasts will continue to grow and be tender. A  yellow discharge may leak from your breasts called colostrum. Your belly button may stick out. You may feel short of breath because of your expanding uterus. You may notice the fetus "dropping," or moving lower in your abdomen. You may have a bloody mucus discharge. This usually occurs a few days to a week before labor begins. Your cervix becomes thin and soft (effaced) near your due date. WHAT TO EXPECT AT YOUR PRENATAL EXAMS  You will have prenatal exams every 2 weeks until week 36. Then, you will have weekly prenatal exams. During a routine prenatal  visit: You will be weighed to make sure you and the fetus are growing normally. Your blood pressure is taken. Your abdomen will be measured to track your baby's growth. The fetal heartbeat will be listened to. Any test results from the previous visit will be discussed. You may have a cervical check near your due date to see if you have effaced. At around 36 weeks, your caregiver will check your cervix. At the same time, your caregiver will also perform a test on the secretions of the vaginal tissue. This test is to determine if a type of bacteria, Group B streptococcus, is present. Your caregiver will explain this further. Your caregiver may ask you: What your birth plan is. How you are feeling. If you are feeling the baby move. If you have had any abnormal symptoms, such as leaking fluid, bleeding, severe headaches, or abdominal cramping. If you have any questions. Other tests or screenings that may be performed during your third trimester include: Blood tests that check for low iron levels (anemia). Fetal testing to check the health, activity level, and growth of the fetus. Testing is done if you have certain medical conditions or if there are problems during the pregnancy. FALSE LABOR You may feel small, irregular contractions that eventually go away. These are called Braxton Hicks contractions, or false labor. Contractions may last for hours, days, or even weeks before true labor sets in. If contractions come at regular intervals, intensify, or become painful, it is best to be seen by your caregiver.  SIGNS OF LABOR  Menstrual-like cramps. Contractions that are 5 minutes apart or less. Contractions that start on the top of the uterus and spread down to the lower abdomen and back. A sense of increased pelvic pressure or back pain. A watery or bloody mucus discharge that comes from the vagina. If you have any of these signs before the 37th week of pregnancy, call your caregiver right away.  You need to go to the hospital to get checked immediately. HOME CARE INSTRUCTIONS  Avoid all smoking, herbs, alcohol, and unprescribed drugs. These chemicals affect the formation and growth of the baby. Follow your caregiver's instructions regarding medicine use. There are medicines that are either safe or unsafe to take during pregnancy. Exercise only as directed by your caregiver. Experiencing uterine cramps is a good sign to stop exercising. Continue to eat regular, healthy meals. Wear a good support bra for breast tenderness. Do not use hot tubs, steam rooms, or saunas. Wear your seat belt at all times when driving. Avoid raw meat, uncooked cheese, cat litter boxes, and soil used by cats. These carry germs that can cause birth defects in the baby. Take your prenatal vitamins. Try taking a stool softener (if your caregiver approves) if you develop constipation. Eat more high-fiber foods, such as fresh vegetables or fruit and whole grains. Drink plenty of fluids to keep your urine clear or pale yellow.  Take warm sitz baths to soothe any pain or discomfort caused by hemorrhoids. Use hemorrhoid cream if your caregiver approves. If you develop varicose veins, wear support hose. Elevate your feet for 15 minutes, 3-4 times a day. Limit salt in your diet. Avoid heavy lifting, wear low heal shoes, and practice good posture. Rest a lot with your legs elevated if you have leg cramps or low back pain. Visit your dentist if you have not gone during your pregnancy. Use a soft toothbrush to brush your teeth and be gentle when you floss. A sexual relationship may be continued unless your caregiver directs you otherwise. Do not travel far distances unless it is absolutely necessary and only with the approval of your caregiver. Take prenatal classes to understand, practice, and ask questions about the labor and delivery. Make a trial run to the hospital. Pack your hospital bag. Prepare the baby's  nursery. Continue to go to all your prenatal visits as directed by your caregiver. SEEK MEDICAL CARE IF: You are unsure if you are in labor or if your water has broken. You have dizziness. You have mild pelvic cramps, pelvic pressure, or nagging pain in your abdominal area. You have persistent nausea, vomiting, or diarrhea. You have a bad smelling vaginal discharge. You have pain with urination. SEEK IMMEDIATE MEDICAL CARE IF:  You have a fever. You are leaking fluid from your vagina. You have spotting or bleeding from your vagina. You have severe abdominal cramping or pain. You have rapid weight loss or gain. You have shortness of breath with chest pain. You notice sudden or extreme swelling of your face, hands, ankles, feet, or legs. You have not felt your baby move in over an hour. You have severe headaches that do not go away with medicine. You have vision changes. Document Released: 02/27/2001 Document Revised: 03/10/2013 Document Reviewed: 05/06/2012 [facility] Patient Information 2015 [place], [place]. This information is not intended to replace advice given to you by your health care provider. Make sure you discuss any questions you have with your health care provider.

## 2021-12-01 LAB — GLUCOSE TOLERANCE, 2 HOURS W/ 1HR
Glucose, 1 hour: 80 mg/dL (ref 70–179)
Glucose, 2 hour: 89 mg/dL (ref 70–152)
Glucose, Fasting: 70 mg/dL (ref 70–91)

## 2021-12-01 LAB — HIV ANTIBODY (ROUTINE TESTING W REFLEX): HIV Screen 4th Generation wRfx: NONREACTIVE

## 2021-12-01 LAB — CBC
Hematocrit: 34.5 % (ref 34.0–46.6)
Hemoglobin: 10.8 g/dL — ABNORMAL LOW (ref 11.1–15.9)
MCH: 24.1 pg — ABNORMAL LOW (ref 26.6–33.0)
MCHC: 31.3 g/dL — ABNORMAL LOW (ref 31.5–35.7)
MCV: 77 fL — ABNORMAL LOW (ref 79–97)
Platelets: 253 10*3/uL (ref 150–450)
RBC: 4.49 x10E6/uL (ref 3.77–5.28)
RDW: 15.6 % — ABNORMAL HIGH (ref 11.7–15.4)
WBC: 12.3 10*3/uL — ABNORMAL HIGH (ref 3.4–10.8)

## 2021-12-01 LAB — ANTIBODY SCREEN: Antibody Screen: NEGATIVE

## 2021-12-01 LAB — RPR: RPR Ser Ql: NONREACTIVE

## 2021-12-18 ENCOUNTER — Telehealth: Payer: Self-pay | Admitting: Obstetrics & Gynecology

## 2021-12-18 MED ORDER — METRONIDAZOLE 500 MG PO TABS: 500.0000 mg | ORAL_TABLET | Freq: Two times a day (BID) | ORAL | 0 refills | Status: DC

## 2021-12-18 NOTE — Addendum Note (Signed)
Addended by: Janece Canterbury on: 12/18/2021 12:13 PM   Modules accepted: Orders

## 2021-12-18 NOTE — Telephone Encounter (Signed)
Patient needs flagyl sent back in. The pharmacy put in back on shelf after 2 weeks. She also has some cold symptoms that she needs some advice on what she can take for it. Please advise.

## 2021-12-21 ENCOUNTER — Ambulatory Visit (INDEPENDENT_AMBULATORY_CARE_PROVIDER_SITE_OTHER): Payer: Medicaid Other

## 2021-12-21 ENCOUNTER — Ambulatory Visit (INDEPENDENT_AMBULATORY_CARE_PROVIDER_SITE_OTHER): Payer: Medicaid Other | Admitting: Obstetrics & Gynecology

## 2021-12-21 VITALS — BP 115/68 | HR 80 | Wt 310.6 lb

## 2021-12-21 DIAGNOSIS — O99210 Obesity complicating pregnancy, unspecified trimester: Secondary | ICD-10-CM

## 2021-12-21 DIAGNOSIS — O23592 Infection of other part of genital tract in pregnancy, second trimester: Secondary | ICD-10-CM

## 2021-12-21 DIAGNOSIS — O09299 Supervision of pregnancy with other poor reproductive or obstetric history, unspecified trimester: Secondary | ICD-10-CM

## 2021-12-21 DIAGNOSIS — Z3483 Encounter for supervision of other normal pregnancy, third trimester: Secondary | ICD-10-CM

## 2021-12-21 DIAGNOSIS — A5901 Trichomonal vulvovaginitis: Secondary | ICD-10-CM

## 2021-12-21 DIAGNOSIS — Z3480 Encounter for supervision of other normal pregnancy, unspecified trimester: Secondary | ICD-10-CM

## 2021-12-21 MED ORDER — METRONIDAZOLE 500 MG PO TABS: 500.0000 mg | ORAL_TABLET | Freq: Two times a day (BID) | ORAL | 1 refills | Status: DC

## 2021-12-21 NOTE — Progress Notes (Signed)
[place] 29 wks,cephalic,cx 2.9 cm,anterior placenta gr 1,AFI 14.6 cm,FHR 123 bpm,EFW 1337 g 41%

## 2021-12-21 NOTE — Progress Notes (Signed)
   LOW-RISK PREGNANCY VISIT Patient name: Lindsay James MRN [id number]  Date of birth: Oct 14, 1990 Chief Complaint:   Routine Prenatal Visit  History of Present Illness:   Lindsay James is a 31 y.o. G39P1011 female at [redacted]w[redacted]d with an Estimated Date of Delivery: 03/08/22 being seen today for ongoing management of a low-risk pregnancy.   Trich- plans to pick up treatment later today     11/30/2021    9:07 AM 09/01/2021   10:03 AM 01/10/2021    8:26 AM 02/15/2020    2:50 PM 01/26/2020    9:40 AM  Depression screen PHQ 2/9  Decreased Interest 1 3 0 0 3  Down, Depressed, Hopeless 1 3 0 0 2  PHQ - 2 Score 2 6 0 0 5  Altered sleeping 3 3  1 3   Tired, decreased energy 3 3  1 1   Change in appetite 1 2  1 3   Feeling bad or failure about yourself  0 0  1 2  Trouble concentrating 1 2  0 0  Moving slowly or fidgety/restless 1 3  1  0  Suicidal thoughts 0 0  0 0  PHQ-9 Score 11 19  5 14     Today she reports no complaints. Contractions: Not present. Vag. Bleeding: None.  Movement: Present. denies leaking of fluid. Review of Systems:   Pertinent items are noted in HPI Denies abnormal vaginal discharge w/ itching/odor/irritation, headaches, visual changes, shortness of breath, chest pain, abdominal pain, severe nausea/vomiting, or problems with urination or bowel movements unless otherwise stated above. Pertinent History Reviewed:  Reviewed past medical,surgical, social, obstetrical and family history.  Reviewed problem list, medications and allergies.  Physical Assessment:   Vitals:   12/21/21 1208  BP: 115/68  Pulse: 80  Weight: (!) 310 lb 9.6 oz (140.9 kg)  Body mass index is 53.31 kg/m.        Physical Examination:   General appearance: Well appearing, and in no distress  Mental status: Alert, oriented to person, place, and time  Skin: Warm & dry  Respiratory: Normal respiratory effort, no distress  Abdomen: Soft, gravid, nontender  Pelvic: Cervical exam deferred          Extremities: Edema: None  Psych:  mood and affect appropriate  Fetal Status:     Movement: Present  cephalic,cx 2.9 cm,anterior placenta gr 1,AFI 14.6 cm,FHR 123 bpm,EFW 1337 g 41%  Chaperone: n/a    No results found for this or any previous visit (from the past 24 hour(s)).   Assessment & Plan:  1) Low-risk pregnancy G3P1011 at [redacted]w[redacted]d with an Estimated Date of Delivery: 03/08/22   2) +TRICH, hopeful that next visit TOC will be able to completed  Meds: No orders of the defined types were placed in this encounter.  Labs/procedures today: growth  Plan:  Continue routine obstetrical care  Next visit: prefers in person    Reviewed: Preterm labor symptoms and general obstetric precautions including but not limited to vaginal bleeding, contractions, leaking of fluid and fetal movement were reviewed in detail with the patient.  All questions were answered. Pt has home bp cuff. Check bp weekly, let us know if >140/90.   Follow-up: Return in about 3 weeks (around 01/11/2022) for as scheduled in 3-4 wks.  Janyth Pupa, DO Attending [place], [facility] for [employer], [place]

## 2021-12-28 ENCOUNTER — Ambulatory Visit
Admission: EM | Admit: 2021-12-28 | Discharge: 2021-12-28 | Disposition: A | Discharge status: Home / Self Care | Payer: Medicaid Other | Attending: Nurse Practitioner | Admitting: Nurse Practitioner

## 2021-12-28 ENCOUNTER — Encounter: Payer: Self-pay | Admitting: Emergency Medicine

## 2021-12-28 ENCOUNTER — Other Ambulatory Visit: Payer: Self-pay

## 2021-12-28 DIAGNOSIS — R0981 Nasal congestion: Secondary | ICD-10-CM

## 2021-12-28 DIAGNOSIS — R0602 Shortness of breath: Secondary | ICD-10-CM

## 2021-12-28 DIAGNOSIS — Z1152 Encounter for screening for COVID-19: Secondary | ICD-10-CM | POA: Diagnosis present

## 2021-12-28 DIAGNOSIS — R519 Headache, unspecified: Secondary | ICD-10-CM | POA: Diagnosis not present

## 2021-12-28 LAB — RESP PANEL BY RT-PCR (FLU A&B, COVID) ARPGX2
Influenza A by PCR: NEGATIVE
Influenza B by PCR: NEGATIVE
SARS Coronavirus 2 by RT PCR: NEGATIVE

## 2021-12-28 MED ORDER — FLUTICASONE PROPIONATE 50 MCG/ACT NA SUSP: 2.0000 | Freq: Every day | NASAL | 0 refills | Status: DC

## 2021-12-28 MED ORDER — CETIRIZINE HCL 10 MG PO TABS: 10.0000 mg | ORAL_TABLET | Freq: Every day | ORAL | 0 refills | Status: DC

## 2021-12-28 NOTE — ED Provider Notes (Signed)
[facility]    CSN: [id number] Arrival date & time: 12/28/21  1455      History   Chief Complaint Chief Complaint  Patient presents with   Shortness of Breath    HPI Lindsay James is a 31 y.o. female.   The history is provided by the patient.   Patient presents with a 2-day history of shortness of breath, headache, cough, and fatigue.  Patient denies fever, chills, ear pain, sore throat, headache, wheezing, abdominal pain, nausea, vomiting, or diarrhea.  Patient reports that she is 7 months pregnant.  She also endorses lower extremity swelling.  Patient states that she has not tried any medication for her symptoms as she does not like to take any medications being pregnant.  She states that she did reach out to her obstetrician's office, and they recommended that she come to urgent care for a COVID/flu test.  She states that she has not talked to her obstetrician about the other symptoms she is currently experiencing.  Patient ports that the obstetrician's office did send her a list of medications that she could try, but she has not tried any at this time.  She denies any known sick contacts.  Past Medical History:  Diagnosis Date   Chlamydia    Gonorrhea    High cholesterol    Migraine     Patient Active Problem List   Diagnosis Date Noted   Obesity affecting pregnancy 10/20/2021   Alpha thalassemia silent carrier 09/18/2021   Trichomonal vaginitis in pregnancy 09/07/2021   Encounter for supervision of normal pregnancy, antepartum 08/31/2021   Acute appendicitis    History of prior pregnancy with short cervix, currently pregnant     Past Surgical History:  Procedure Laterality Date   LAPAROSCOPIC APPENDECTOMY N/A 01/18/2020   Procedure: APPENDECTOMY LAPAROSCOPIC;  Surgeon: Aviva Signs, MD;  Location: [facility];  Service: General;  Laterality: N/A;    OB History     Gravida  3   Para  1   Term  1   Preterm      AB  1   Living  1      SAB   1   IAB      Ectopic      Multiple      Live Births  1            Home Medications    Prior to Admission medications   Medication Sig Start Date End Date Taking? Authorizing Provider  cetirizine (ZYRTEC ALLERGY) 10 MG tablet Take 1 tablet (10 mg total) by mouth daily. 12/28/21  Yes Bryanne Riquelme-Warren, Alda Lea, NP  fluticasone (FLONASE) 50 MCG/ACT nasal spray Place 2 sprays into both nostrils daily. 12/28/21  Yes Tracer Gutridge-Warren, Alda Lea, NP  acetaminophen (TYLENOL) 500 MG tablet Take 500 mg by mouth every 6 (six) hours as needed.    [provider]  aspirin 81 MG chewable tablet Chew 2 tablets (162 mg total) by mouth daily. Patient not taking: Reported on 09/22/2021 09/01/21   Myrtis Ser, CNM  Blood Pressure Monitor MISC For regular home bp monitoring during pregnancy Patient not taking: Reported on 10/20/2021 09/01/21   Myrtis Ser, CNM  doxylamine, Sleep, (UNISOM) 25 MG tablet Take 25 mg by mouth at bedtime as needed.    [provider]  metroNIDAZOLE (FLAGYL) 500 MG tablet Take 1 tablet (500 mg total) by mouth 2 (two) times daily. 12/21/21   Janyth Pupa, DO  ondansetron (ZOFRAN) 4 MG  tablet Take 1 tablet (4 mg total) by mouth every 6 (six) hours. Patient not taking: Reported on 10/20/2021 08/30/21   Adline Potter, NP  vitamin B-6 (PYRIDOXINE) 25 MG tablet Take 25 mg by mouth daily.    [provider]  promethazine (PHENERGAN) 25 MG tablet Take 1 tablet (25 mg total) by mouth every 6 (six) hours as needed for nausea or vomiting. 07/22/19 10/28/19  Pollina, Canary Brim, MD    Family History Family History  Problem Relation Age of Onset   Asthma Mother    Arthritis/Rheumatoid Mother    Cancer Father    Diabetes Maternal Grandmother     Social History Social History   Tobacco Use   Smoking status: Former    Packs/day: 0.50    Types: Cigarettes, Cigars   Smokeless tobacco: Never   Tobacco comments:    1 black & mild daily.  stopped  cigarettes  nov 2021  Vaping Use   Vaping Use: Never used  Substance Use Topics   Alcohol use: Not Currently    Comment: socially   Drug use: Not Currently    Types: Marijuana     Allergies   Peanut-containing drug products   Review of Systems Review of Systems Per HPI  Physical Exam Triage Vital Signs ED Triage Vitals [12/28/21 1618]  Enc Vitals Group     BP (!) 111/56     Pulse Rate 96     Resp 20     Temp 98.8 F (37.1 C)     Temp Source Oral     SpO2 98 %     Weight      Height      Head Circumference      Peak Flow      Pain Score 8     Pain Loc      Pain Edu?      Excl. in GC?    No data found.  Updated Vital Signs BP (!) 111/56 (BP Location: Right Arm)   Pulse 96   Temp 98.8 F (37.1 C) (Oral)   Resp 20   LMP  (LMP Unknown)   SpO2 98%   Breastfeeding No   Visual Acuity Right Eye Distance:   Left Eye Distance:   Bilateral Distance:    Right Eye Near:   Left Eye Near:    Bilateral Near:     Physical Exam Vitals and nursing note reviewed.  Constitutional:      General: She is not in acute distress.    Appearance: She is well-developed.  HENT:     Head: Normocephalic and atraumatic.     Right Ear: Tympanic membrane, ear canal and external ear normal.     Left Ear: Tympanic membrane, ear canal and external ear normal.     Nose: Congestion present.     Right Turbinates: Enlarged and swollen.     Left Turbinates: Enlarged and swollen.     Right Sinus: No maxillary sinus tenderness or frontal sinus tenderness.     Left Sinus: No maxillary sinus tenderness or frontal sinus tenderness.     Mouth/Throat:     Lips: Pink.     Mouth: Mucous membranes are moist.     Pharynx: Oropharynx is clear. Uvula midline. Posterior oropharyngeal erythema present. No pharyngeal swelling or oropharyngeal exudate.     Tonsils: No tonsillar exudate.  Eyes:     Extraocular Movements: Extraocular movements intact.     Pupils: Pupils are equal, round, and  reactive to light.  Cardiovascular:     Rate and Rhythm: Normal rate and regular rhythm.     Pulses: Normal pulses.     Heart sounds: Normal heart sounds.  Pulmonary:     Effort: Pulmonary effort is normal.     Breath sounds: Normal breath sounds. No decreased breath sounds.  Abdominal:     General: Bowel sounds are normal.     Palpations: Abdomen is soft.     Tenderness: There is no abdominal tenderness.  Musculoskeletal:     Cervical back: Normal range of motion and neck supple.  Lymphadenopathy:     Cervical: No cervical adenopathy.  Skin:    General: Skin is warm and dry.  Neurological:     General: No focal deficit present.     Mental Status: She is alert and oriented to person, place, and time.  Psychiatric:        Mood and Affect: Mood normal.        Behavior: Behavior normal.      UC Treatments / Results  Labs (all labs ordered are listed, but only abnormal results are displayed) Labs Reviewed  RESP PANEL BY RT-PCR (FLU A&B, COVID) [id number]    EKG   Radiology No results found.  Procedures Procedures (including critical care time)  Medications Ordered in UC Medications - No data to display  Initial Impression / Assessment and Plan / UC Course  I have reviewed the triage vital signs and the nursing notes.  Pertinent labs & imaging results that were available during my care of the patient were reviewed by me and considered in my medical decision making (see chart for details).  Patient presents for complaints of shortness of breath, headache, cough and fatigue.  The patient is currently well-appearing, and is in no acute distress.  On exam, patient's vital signs are stable, lung sounds are clear throughout.  Lengthy discussion with patient that symptoms including her shortness of breath and fatigue may be related to her pregnancy.  Also advised patient that the lower extremity swelling is most likely related to her pregnancy.  With regard to her cough and nasal  congestion and headache, symptoms appear to be consistent with allergic rhinitis.  For her allergic rhinitis, will treat patient with Zyrtec and fluticasone.  With regard to her shortness of breath and fatigue, patient was advised that x-ray is contraindicated at this time given her current pregnancy.  Advised patient I would like for her to follow-up with her obstetrician regarding her ongoing symptoms.  COVID/flu test was performed today.  Patient was advised that she will be contacted if the results are positive.  Patient would like to be started on molnupiravir if her pending results are positive for COVID.  Patient was advised to continue to monitor her symptoms for worsening, increase fluids and allow for plenty of rest, and to take over-the-counter Tylenol for pain or discomfort.  Patient was also advised to follow-up with her obstetrician on 12/29/2021 to get an appointment for further evaluation.  Patient was given the opportunity to ask questions.  Patient verbalizes understanding with all questions answered.  Patient stable for discharge. Final Clinical Impressions(s) / UC Diagnoses   Final diagnoses:  Encounter for screening for COVID-19  Nonintractable headache, unspecified chronicity pattern, unspecified headache type  Shortness of breath  Nasal sinus congestion     Discharge Instructions      COVID/flu test is pending. If your results are positive, we will contact you. Based on your  labwork, you are a candidate to receive molnupiravir. Take medication as directed. Continue your current allergy medication regimen. Increase fluids and get plenty of rest. May take over-the-counter Tylenol as needed for pain, fever, or general discomfort. Recommend normal saline nasal spray to help with nasal congestion throughout the day. For your cough, it may be helpful to use a humidifier at bedtime during sleep. If your symptoms fail to improve after 3 to 5 days, follow-up with your  obstetrician.     ED Prescriptions     Medication Sig Dispense Auth. Provider   cetirizine (ZYRTEC ALLERGY) 10 MG tablet Take 1 tablet (10 mg total) by mouth daily. 30 tablet Mayela Bullard-Warren, Sadie Haber, NP   fluticasone (FLONASE) 50 MCG/ACT nasal spray Place 2 sprays into both nostrils daily. 16 g Delsin Copen-Warren, Sadie Haber, NP      PDMP not reviewed this encounter.   Abran Cantor, NP 12/28/21 1700

## 2021-12-28 NOTE — Discharge Instructions (Addendum)
COVID/flu test is pending. If your results are positive, we will contact you. Based on your labwork, you are a candidate to receive molnupiravir. Take medication as directed. Continue your current allergy medication regimen. Increase fluids and get plenty of rest. May take over-the-counter Tylenol as needed for pain, fever, or general discomfort. Recommend normal saline nasal spray to help with nasal congestion throughout the day. For your cough, it may be helpful to use a humidifier at bedtime during sleep. If your symptoms fail to improve after 3 to 5 days, follow-up with your obstetrician.

## 2021-12-28 NOTE — ED Triage Notes (Signed)
Pt reports shortness of breath, cough, fatigue x2 days. Pt reports is 7 months pregnant and reports "just don't feel right."

## 2022-01-05 ENCOUNTER — Encounter: Payer: Medicaid Other | Admitting: Obstetrics & Gynecology

## 2022-01-05 ENCOUNTER — Other Ambulatory Visit: Payer: Medicaid Other

## 2022-01-18 ENCOUNTER — Ambulatory Visit (INDEPENDENT_AMBULATORY_CARE_PROVIDER_SITE_OTHER): Payer: Medicaid Other

## 2022-01-18 ENCOUNTER — Ambulatory Visit (INDEPENDENT_AMBULATORY_CARE_PROVIDER_SITE_OTHER): Payer: Medicaid Other | Admitting: Advanced Practice Midwife

## 2022-01-18 VITALS — BP 120/69 | HR 86 | Wt 324.0 lb

## 2022-01-18 DIAGNOSIS — Z8619 Personal history of other infectious and parasitic diseases: Secondary | ICD-10-CM

## 2022-01-18 DIAGNOSIS — Z3490 Encounter for supervision of normal pregnancy, unspecified, unspecified trimester: Secondary | ICD-10-CM

## 2022-01-18 DIAGNOSIS — Z113 Encounter for screening for infections with a predominantly sexual mode of transmission: Secondary | ICD-10-CM

## 2022-01-18 DIAGNOSIS — Z3A33 33 weeks gestation of pregnancy: Secondary | ICD-10-CM

## 2022-01-18 DIAGNOSIS — O99210 Obesity complicating pregnancy, unspecified trimester: Secondary | ICD-10-CM

## 2022-01-18 DIAGNOSIS — O99213 Obesity complicating pregnancy, third trimester: Secondary | ICD-10-CM

## 2022-01-18 DIAGNOSIS — O09299 Supervision of pregnancy with other poor reproductive or obstetric history, unspecified trimester: Secondary | ICD-10-CM

## 2022-01-18 DIAGNOSIS — A5901 Trichomonal vulvovaginitis: Secondary | ICD-10-CM

## 2022-01-18 DIAGNOSIS — Z3483 Encounter for supervision of other normal pregnancy, third trimester: Secondary | ICD-10-CM

## 2022-01-18 NOTE — Progress Notes (Signed)
   LOW-RISK PREGNANCY VISIT Patient name: Lindsay James MRN [id number]  Date of birth: 12-21-1990 Chief Complaint:   Routine Prenatal Visit (Numbness in 3 fingers on the right hand, needs form completed for dental surgery )  History of Present Illness:   Lindsay MCTIGUE is a 31 y.o. G72P1011 female at [redacted]w[redacted]d with an Estimated Date of Delivery: 03/08/22 being seen today for ongoing management of a low-risk pregnancy.  Today she reports numbness in 3 fingers.  Tips given. Contractions: Irritability. Vag. Bleeding: None.  Movement: Present. denies leaking of fluid.  Weight gain of 14lbs in a month. Some edema Review of Systems:   Pertinent items are noted in HPI Denies abnormal vaginal discharge w/ itching/odor/irritation, headaches, visual changes, shortness of breath, chest pain, abdominal pain, severe nausea/vomiting, or problems with urination or bowel movements unless otherwise stated above. Pertinent History Reviewed:  Reviewed past medical,surgical, social, obstetrical and family history.  Reviewed problem list, medications and allergies. Physical Assessment:   Vitals:   01/18/22 1036  BP: 120/69  Pulse: 86  Weight: (!) 324 lb (147 kg)  Body mass index is 55.61 kg/m.        Physical Examination:   General appearance: Well appearing, and in no distress  Mental status: Alert, oriented to person, place, and time  Skin: Warm & dry  Cardiovascular: Normal heart rate noted  Respiratory: Normal respiratory effort, no distress  Abdomen: Soft, gravid, nontender  Pelvic: Cervical exam deferred         Extremities: Edema: Mild pitting, slight indentation  Fetal Status:     Movement: Present  [place] 33 wks,cephalic,cx 2.9 cm,anterior placenta gr 1,AFI 18.3 cm,normal ovaries,FHR 138 bpm,EFW 2021 g 31%   Chaperone: n/a    No results found for this or any previous visit (from the past 24 hour(s)).  Assessment & Plan:  1) Low-risk pregnancy G3P1011 at [redacted]w[redacted]d with an Estimated Date of  Delivery: 03/08/22   2) Morbid obesity, growth [place] q 4 weeks; testing at 36 weeks, IOL 39-40 weeks   Meds: No orders of the defined types were placed in this encounter.  Labs/procedures today: TOC trich  Plan:  Continue routine obstetrical care  Next visit: prefers in person    Reviewed: Preterm labor symptoms and general obstetric precautions including but not limited to vaginal bleeding, contractions, leaking of fluid and fetal movement were reviewed in detail with the patient.  All questions were answered. Has home bp cuff.. Check bp weekly, let us know if >140/90.   Follow-up: Return in about 2 weeks (around 02/01/2022) for LROB;will need weekly BPPs/HROB visits starting in 3 weeks.  Future Appointments  Date Time Provider [place]  02/15/2022  8:30 AM [facility] [place] [facility] None  02/15/2022  9:30 AM Cresenzo-Dishmon, Joaquim Lai, CNM [facility]    Orders Placed This Encounter  Procedures   US FETAL BPP WO NON STRESS   Christin Fudge DNP, CNM 01/18/2022 11:07 AM

## 2022-01-18 NOTE — Patient Instructions (Addendum)
Carpal Tunnel Syndrome  Carpal tunnel syndrome is a condition that causes pain, numbness, and weakness in your hand and fingers. The carpal tunnel is a narrow area located on the palm side of your wrist. Repeated wrist motion or certain diseases may cause swelling within the tunnel. This swelling pinches the main nerve in the wrist. The main nerve in the wrist is called the median nerve. What are the causes? This condition may be caused by: Repeated and forceful wrist and hand motions. Wrist injuries. Arthritis. A cyst or tumor in the carpal tunnel. Fluid buildup during pregnancy. Use of tools that vibrate. Sometimes the cause of this condition is not known. What increases the risk? The following factors may make you more likely to develop this condition: Having a job that requires you to repeatedly or forcefully move your wrist or hand or requires you to use tools that vibrate. This may include jobs that involve using computers, working on an assembly line, or working with power tools such as drills or sanders. Being a woman. Having certain conditions, such as: Diabetes. Obesity. An underactive thyroid (hypothyroidism). Kidney failure. Rheumatoid arthritis. What are the signs or symptoms? Symptoms of this condition include: A tingling feeling in your fingers, especially in your thumb, index, and middle fingers. Tingling or numbness in your hand. An aching feeling in your entire arm, especially when your wrist and elbow are bent for a long time. Wrist pain that goes up your arm to your shoulder. Pain that goes down into your palm or fingers. A weak feeling in your hands. You may have trouble grabbing and holding items. Your symptoms may feel worse during the night. How is this diagnosed? This condition is diagnosed with a medical history and physical exam. You may also have tests, including: Electromyogram (EMG). This test measures electrical signals sent by your nerves into the  muscles. Nerve conduction study. This test measures how well electrical signals pass through your nerves. Imaging tests, such as X-rays, ultrasound, and MRI. These tests check for possible causes of your condition. How is this treated? This condition may be treated with: Lifestyle changes. It is important to stop or change the activity that caused your condition. Doing exercise and activities to strengthen and stretch your muscles and tendons (physical therapy). Making lifestyle changes to help with your condition and learning how to do your daily activities safely (occupational therapy). Medicines for pain and inflammation. This may include medicine that is injected into your wrist. A wrist splint or brace. Surgery. Follow these instructions at home: If you have a splint or brace: Wear the splint or brace as told by your health care provider. Remove it only as told by your health care provider. Loosen the splint or brace if your fingers tingle, become numb, or turn cold and blue. Keep the splint or brace clean. If the splint or brace is not waterproof: Do not let it get wet. Cover it with a watertight covering when you take a bath or shower. Managing pain, stiffness, and swelling If directed, put ice on the painful area. To do this: If you have a removeable splint or brace, remove it as told by your health care provider. Put ice in a plastic bag. Place a towel between your skin and the bag or between the splint or brace and the bag. Leave the ice on for 20 minutes, 2-3 times a day. Do not fall asleep with the cold pack on your skin. Remove the ice if your skin turns   bright red. This is very important. If you cannot feel pain, heat, or cold, you have a greater risk of damage to the area. Move your fingers often to reduce stiffness and swelling. General instructions Take over-the-counter and prescription medicines only as told by your health care provider. Rest your wrist and hand from  any activity that may be causing your pain. If your condition is work related, talk with your employer about changes that can be made, such as getting a wrist pad to use while typing. Do any exercises as told by your health care provider, physical therapist, or occupational therapist. Keep all follow-up visits. This is important. Contact a health care provider if: You have new symptoms. Your pain is not controlled with medicines. Your symptoms get worse. Get help right away if: You have severe numbness or tingling in your wrist or hand. Summary Carpal tunnel syndrome is a condition that causes pain, numbness, and weakness in your hand and fingers. It is usually caused by repeated wrist motions. Lifestyle changes and medicines are used to treat carpal tunnel syndrome. Surgery may be recommended. Follow your health care provider's instructions about wearing a splint, resting from activity, keeping follow-up visits, and calling for help. This information is not intended to replace advice given to you by your health care provider. Make sure you discuss any questions you have with your health care provider. Document Revised: 07/16/2019 Document Reviewed: 07/16/2019 Elsevier Patient Education  [address] [employer]. [name], I greatly value your feedback.  If you receive a survey following your visit with [place] today, we appreciate you taking the time to fill it out.  Thanks, Cathie Beams, DNP, CNM  [facility] HAS MOVED!!! It is now [facility] ([address] [place], [place] [address]) Entrance located off of [address] Free 24/7 valet parking   Go to [email or web address] to register for FREE online childbirth classes    Call the office [phone]) or go to [facility] if: You begin to have strong, frequent contractions Your water breaks.  Sometimes it is a big gush of fluid, sometimes it is just a trickle that keeps getting  your panties wet or running down your legs You have vaginal bleeding.  It is normal to have a small amount of spotting if your cervix was checked.  You don't feel your baby moving like normal.  If you don't, get you something to eat and drink and lay down and focus on feeling your baby move.  You should feel at least 10 movements in 2 hours.  If you don't, you should call the office or go to [facility].   Home Blood Pressure Monitoring for Patients   Your provider has recommended that you check your blood pressure (BP) at least once a week at home. If you do not have a blood pressure cuff at home, one will be provided for you. Contact your provider if you have not received your monitor within 1 week.   Helpful Tips for Accurate Home Blood Pressure Checks  Don't smoke, exercise, or drink caffeine 30 minutes before checking your BP Use the restroom before checking your BP (a full bladder can raise your pressure) Relax in a comfortable upright chair Feet on the ground Left arm resting comfortably on a flat surface at the level of your heart Legs uncrossed Back supported Sit quietly and don't talk Place the cuff on your bare arm Adjust snuggly, so that only two  fingertips can fit between your skin and the top of the cuff Check 2 readings separated by at least one minute Keep a log of your BP readings For a visual, please reference this diagram: http://ccnc.care/bpdiagram  Provider Name: [facility]     Phone: [phone]  Zone 1: ALL CLEAR  Continue to monitor your symptoms:  BP reading is less than 140 (top number) or less than 90 (bottom number)  No right upper stomach pain No headaches or seeing spots No feeling nauseated or throwing up No swelling in face and hands  Zone 2: CAUTION Call your doctor's office for any of the following:  BP reading is greater than 140 (top number) or greater than 90 (bottom number)  Stomach pain under your ribs in the middle or right  side Headaches or seeing spots Feeling nauseated or throwing up Swelling in face and hands  Zone 3: EMERGENCY  Seek immediate medical care if you have any of the following:  BP reading is greater than160 (top number) or greater than 110 (bottom number) Severe headaches not improving with Tylenol Serious difficulty catching your breath Any worsening symptoms from Zone 2  For Headaches:  Stay well hydrated, drink enough water so that your urine is clear, sometimes if you are dehydrated you can get headaches Eat small frequent meals and snacks, sometimes if you are hungry you can get headaches Sometimes you get headaches during pregnancy from the pregnancy hormones You can try tylenol (1-2 regular strength 325mg  or 1-2 extra strength 500mg ) as directed on the box. The least amount of medication that works is best.  Cool compresses (cool wet washcloth or ice pack) to area of head that is hurting You can also try drinking a caffeinated drink to see if this will help If not helping, try below:  For Prevention of Headaches/Migraines: CoQ10 100mg  three times daily Vitamin B2 400mg  daily Magnesium Oxide 400-600mg  daily  Foods to alleviate migraines:  1) dark leafy greens 2) avocado 3) tuna 4) salmon  5) beans and legumes  Foods to avoid: 1) Excessive (or irregular timing) coffee 3) aged cheeses 4) chocolate 5) citrus fruits 6) aspartame and other artifical sweeteners 7) yeast 8) MSG (in processed foods) 9) processed and cured meats 10) nuts and certain seeds 11) chicken livers and other organ meats 12) dairy products like buttermilk, sour cream, and yogurt 13) dried fruits like dates, figs, and raisins 14) garlic 15) onions 16) potato chips 17) pickled foods like olives and sauerkraut 18) some fresh fruits like ripe banana, papaya, red plums, raspberries, kiwi, pineapple 19) tomato-based products  Recommend to keep a migraine diary: rate daily the severity of your headache  (1-10) and what foods you eat that day to help determine patterns.   If You Get a Bad Headache/Migraine: Benadryl 25mg   Magnesium Oxide 1 large Gatorade 2 extra strength Tylenol (1,000mg  total) 1 cup coffee or Coke      If this doesn't help please call us @ [phone]

## 2022-01-18 NOTE — Progress Notes (Signed)
[place] 33 wks,cephalic,cx 2.9 cm,anterior placenta gr 1,AFI 18.3 cm,normal ovaries,FHR 138 bpm,EFW 2021 g 31%

## 2022-01-23 LAB — TRICHOMONAS VAGINALIS, PROBE AMP: Trich vag by NAA: NEGATIVE

## 2022-01-28 ENCOUNTER — Other Ambulatory Visit: Payer: Self-pay | Admitting: Obstetrics & Gynecology

## 2022-01-28 ENCOUNTER — Other Ambulatory Visit: Payer: Self-pay

## 2022-01-28 ENCOUNTER — Encounter (HOSPITAL_COMMUNITY): Payer: Self-pay | Admitting: Obstetrics and Gynecology

## 2022-01-28 ENCOUNTER — Inpatient Hospital Stay (HOSPITAL_COMMUNITY)
Admission: EM | Admit: 2022-01-28 | Discharge: 2022-01-31 | DRG: 832 | Disposition: A | Discharge status: Home / Self Care | Payer: Medicaid Other | Attending: Obstetrics and Gynecology | Admitting: Obstetrics and Gynecology

## 2022-01-28 DIAGNOSIS — Z79899 Other long term (current) drug therapy: Secondary | ICD-10-CM | POA: Diagnosis not present

## 2022-01-28 DIAGNOSIS — O99713 Diseases of the skin and subcutaneous tissue complicating pregnancy, third trimester: Principal | ICD-10-CM | POA: Diagnosis present

## 2022-01-28 DIAGNOSIS — O99413 Diseases of the circulatory system complicating pregnancy, third trimester: Secondary | ICD-10-CM | POA: Diagnosis present

## 2022-01-28 DIAGNOSIS — E7800 Pure hypercholesterolemia, unspecified: Secondary | ICD-10-CM | POA: Diagnosis present

## 2022-01-28 DIAGNOSIS — R1909 Other intra-abdominal and pelvic swelling, mass and lump: Secondary | ICD-10-CM | POA: Diagnosis present

## 2022-01-28 DIAGNOSIS — Z87891 Personal history of nicotine dependence: Secondary | ICD-10-CM | POA: Diagnosis not present

## 2022-01-28 DIAGNOSIS — L089 Local infection of the skin and subcutaneous tissue, unspecified: Secondary | ICD-10-CM

## 2022-01-28 DIAGNOSIS — D72829 Elevated white blood cell count, unspecified: Secondary | ICD-10-CM

## 2022-01-28 DIAGNOSIS — J45909 Unspecified asthma, uncomplicated: Secondary | ICD-10-CM | POA: Diagnosis not present

## 2022-01-28 DIAGNOSIS — O99213 Obesity complicating pregnancy, third trimester: Secondary | ICD-10-CM | POA: Diagnosis present

## 2022-01-28 DIAGNOSIS — Z9049 Acquired absence of other specified parts of digestive tract: Secondary | ICD-10-CM

## 2022-01-28 DIAGNOSIS — Z6841 Body Mass Index (BMI) 40.0 and over, adult: Secondary | ICD-10-CM

## 2022-01-28 DIAGNOSIS — L02416 Cutaneous abscess of left lower limb: Secondary | ICD-10-CM | POA: Diagnosis present

## 2022-01-28 DIAGNOSIS — Z3A35 35 weeks gestation of pregnancy: Secondary | ICD-10-CM | POA: Diagnosis not present

## 2022-01-28 DIAGNOSIS — Z91010 Allergy to peanuts: Secondary | ICD-10-CM

## 2022-01-28 DIAGNOSIS — Z3A34 34 weeks gestation of pregnancy: Secondary | ICD-10-CM

## 2022-01-28 DIAGNOSIS — O99283 Endocrine, nutritional and metabolic diseases complicating pregnancy, third trimester: Secondary | ICD-10-CM | POA: Diagnosis present

## 2022-01-28 DIAGNOSIS — L049 Acute lymphadenitis, unspecified: Secondary | ICD-10-CM | POA: Insufficient documentation

## 2022-01-28 DIAGNOSIS — L0291 Cutaneous abscess, unspecified: Secondary | ICD-10-CM | POA: Diagnosis not present

## 2022-01-28 DIAGNOSIS — E538 Deficiency of other specified B group vitamins: Secondary | ICD-10-CM | POA: Diagnosis present

## 2022-01-28 DIAGNOSIS — E119 Type 2 diabetes mellitus without complications: Secondary | ICD-10-CM | POA: Diagnosis not present

## 2022-01-28 HISTORY — DX: Unspecified acute appendicitis: K35.80

## 2022-01-28 LAB — COMPREHENSIVE METABOLIC PANEL
ALT: 12 U/L (ref 0–44)
AST: 17 U/L (ref 15–41)
Albumin: 2.4 g/dL — ABNORMAL LOW (ref 3.5–5.0)
Alkaline Phosphatase: 83 U/L (ref 38–126)
Anion gap: 13 (ref 5–15)
BUN: 7 mg/dL (ref 6–20)
CO2: 22 mmol/L (ref 22–32)
Calcium: 9.1 mg/dL (ref 8.9–10.3)
Chloride: 101 mmol/L (ref 98–111)
Creatinine, Ser: 0.93 mg/dL (ref 0.44–1.00)
GFR, Estimated: >60 mL/min (ref >60)
Glucose, Bld: 104 mg/dL — ABNORMAL HIGH (ref 70–99)
Potassium: 3.4 mmol/L — ABNORMAL LOW (ref 3.5–5.1)
Sodium: 136 mmol/L (ref 135–145)
Total Bilirubin: 0.5 mg/dL (ref 0.3–1.2)
Total Protein: 6.1 g/dL — ABNORMAL LOW (ref 6.5–8.1)

## 2022-01-28 LAB — PROTIME-INR
INR: 1 (ref 0.8–1.2)
Prothrombin Time: 13.4 seconds (ref 11.4–15.2)

## 2022-01-28 LAB — TYPE AND SCREEN
ABO/RH(D): O POS
Antibody Screen: NEGATIVE

## 2022-01-28 LAB — CBC WITH DIFFERENTIAL/PLATELET
Abs Immature Granulocytes: 0.2 10*3/uL — ABNORMAL HIGH (ref 0.00–0.07)
Basophils Absolute: 0 10*3/uL (ref 0.0–0.1)
Basophils Relative: 0 %
Eosinophils Absolute: 0 10*3/uL (ref 0.0–0.5)
Eosinophils Relative: 0 %
HCT: 34.3 % — ABNORMAL LOW (ref 36.0–46.0)
Hemoglobin: 11 g/dL — ABNORMAL LOW (ref 12.0–15.0)
Immature Granulocytes: 1 %
Lymphocytes Relative: 3 %
Lymphs Abs: 0.8 10*3/uL (ref 0.7–4.0)
MCH: 23.9 pg — ABNORMAL LOW (ref 26.0–34.0)
MCHC: 32.1 g/dL (ref 30.0–36.0)
MCV: 74.4 fL — ABNORMAL LOW (ref 80.0–100.0)
Monocytes Absolute: 2.1 10*3/uL — ABNORMAL HIGH (ref 0.1–1.0)
Monocytes Relative: 8 %
Neutro Abs: 24.7 10*3/uL — ABNORMAL HIGH (ref 1.7–7.7)
Neutrophils Relative %: 88 %
Platelets: 234 10*3/uL (ref 150–400)
RBC: 4.61 MIL/uL (ref 3.87–5.11)
RDW: 15.2 % (ref 11.5–15.5)
WBC: 27.8 10*3/uL — ABNORMAL HIGH (ref 4.0–10.5)
nRBC: 0 % (ref 0.0–0.2)

## 2022-01-28 LAB — CBG MONITORING, ED: Glucose-Capillary: 87 mg/dL (ref 70–99)

## 2022-01-28 LAB — LACTIC ACID, PLASMA: Lactic Acid, Venous: 1.3 mmol/L (ref 0.5–1.9)

## 2022-01-28 MED ORDER — SODIUM CHLORIDE 0.9 % IV SOLN
3.0000 g | Freq: Four times a day (QID) | INTRAVENOUS | Status: DC
  Administered 2022-01-28 – 2022-01-31 (×11): 3 g via INTRAVENOUS
  Filled 2022-01-28 (×12): qty 8

## 2022-01-28 MED ORDER — ENOXAPARIN SODIUM 40 MG/0.4ML IJ SOSY: 40.0000 mg | PREFILLED_SYRINGE | INTRAMUSCULAR | Status: DC

## 2022-01-28 MED ORDER — SODIUM CHLORIDE 0.9 % IV SOLN: 3.0000 g | Freq: Four times a day (QID) | INTRAVENOUS | Status: DC

## 2022-01-28 MED ORDER — DOCUSATE SODIUM 100 MG PO CAPS
100.0000 mg | ORAL_CAPSULE | Freq: Every day | ORAL | Status: DC
  Administered 2022-01-29 – 2022-01-30 (×2): 100 mg via ORAL
  Filled 2022-01-28 (×2): qty 1

## 2022-01-28 MED ORDER — ACETAMINOPHEN 325 MG PO TABS: 650.0000 mg | ORAL_TABLET | ORAL | Status: DC | PRN

## 2022-01-28 MED ORDER — DOCUSATE SODIUM 100 MG PO CAPS: 100.0000 mg | ORAL_CAPSULE | Freq: Every day | ORAL | Status: DC

## 2022-01-28 MED ORDER — OXYCODONE HCL 5 MG PO TABS
5.0000 mg | ORAL_TABLET | ORAL | Status: DC | PRN
  Administered 2022-01-28 – 2022-01-29 (×3): 5 mg via ORAL
  Filled 2022-01-28 (×3): qty 1

## 2022-01-28 MED ORDER — CALCIUM CARBONATE ANTACID 500 MG PO CHEW: 2.0000 | CHEWABLE_TABLET | ORAL | Status: DC | PRN

## 2022-01-28 MED ORDER — KCL-LACTATED RINGERS-D5W 20 MEQ/L IV SOLN: INTRAVENOUS | Status: DC

## 2022-01-28 MED ORDER — PRENATAL MULTIVITAMIN CH
1.0000 | ORAL_TABLET | Freq: Every day | ORAL | Status: DC
  Administered 2022-01-29 – 2022-01-31 (×3): 1 via ORAL
  Filled 2022-01-28 (×3): qty 1

## 2022-01-28 MED ORDER — KCL-LACTATED RINGERS-D5W 20 MEQ/L IV SOLN
INTRAVENOUS | Status: DC
  Filled 2022-01-28 (×3): qty 1000

## 2022-01-28 MED ORDER — ACETAMINOPHEN 325 MG PO TABS
650.0000 mg | ORAL_TABLET | Freq: Four times a day (QID) | ORAL | Status: DC | PRN
  Administered 2022-01-29 – 2022-01-30 (×3): 650 mg via ORAL
  Filled 2022-01-28 (×3): qty 2

## 2022-01-28 MED ORDER — PRENATAL MULTIVITAMIN CH: 1.0000 | ORAL_TABLET | Freq: Every day | ORAL | Status: DC

## 2022-01-28 MED ORDER — ACETAMINOPHEN 500 MG PO TABS
1000.0000 mg | ORAL_TABLET | Freq: Once | ORAL | Status: AC
  Administered 2022-01-28: 1000 mg via ORAL
  Filled 2022-01-28: qty 2

## 2022-01-28 NOTE — ED Notes (Signed)
Patient is [redacted] weeks pregnant, fetal heart tone is 165.

## 2022-01-28 NOTE — Consult Note (Signed)
Reason for Consult: Subcutaneous mass, left groin Referring Physician: Dr.Naasz  Lindsay James is an 31 y.o. female.  HPI: Patient is a 31 year old black female who is [redacted] weeks gestation who presents with a hard tender knot in the left upper thigh just below the groin region.  She reports having some tenderness there 2 weeks ago.  She was not started on antibiotic.  It has since swelled and now presents to the emergency room for further evaluation and treatment.  In the emergency room, a bedside ultrasound revealed a septated hard mass that was deep in the subcutaneous tissue.  No internal flow was noted.  Patient denies any fevers.  No drainage has been noted.  Past Medical History:  Diagnosis Date   Chlamydia    Gonorrhea    High cholesterol    Migraine     Past Surgical History:  Procedure Laterality Date   LAPAROSCOPIC APPENDECTOMY N/A 01/18/2020   Procedure: APPENDECTOMY LAPAROSCOPIC;  Surgeon: Franky Macho, MD;  Location: [facility];  Service: General;  Laterality: N/A;    Family History  Problem Relation Age of Onset   Asthma Mother    Arthritis/Rheumatoid Mother    Cancer Father    Diabetes Maternal Grandmother     Social History:  reports that she has quit smoking. Her smoking use included cigarettes and cigars. She smoked an average of .5 packs per day. She has never used smokeless tobacco. She reports that she does not currently use alcohol. She reports that she does not currently use drugs after having used the following drugs: Marijuana.  Allergies:  Allergies  Allergen Reactions   Peanut-Containing Drug Products Rash    Medications: Prior to Admission: (Not in a hospital admission)   Results for orders placed or performed during the hospital encounter of 01/28/22 (from the past 48 hour(s))  CBG monitoring, ED     Status: None   Collection Time: 01/28/22  9:22 AM  Result Value Ref Range   Glucose-Capillary 87 70 - 99 mg/dL    Comment: Glucose reference range  applies only to samples taken after fasting for at least 8 hours.    No results found.  ROS:  Pertinent items are noted in HPI.  Blood pressure (!) 113/58, pulse (!) 104, temperature 99.5 F (37.5 C), temperature source Oral, resp. rate 20, height 5\' 4"  (1.626 m), weight (!) 140.6 kg, SpO2 100 %. Physical Exam: Pleasant black female no acute distress Left upper extremity with a 4 cm ovoid deep subcutaneous tender mass.  It is difficult to assess fluctuance.  No overlying erythema is noted.  White blood cell count pending Assessment/Plan: Impression: Subcutaneous mass, left groin.  Probable lymphangitis or developing abscess.  She is [redacted] weeks pregnant. Plan: No need for urgent surgical intervention at the present time.  I recommend an antibiotic after consultation with obstetrics.  Patient can follow-up with her primary obstetrician who is in [place].  [place] 01/28/2022, 10:42 AM

## 2022-01-28 NOTE — ED Triage Notes (Signed)
Patient started having left leg pain about two weeks ago, then this noticed an large hardened area in her left groin area that is warm to touch.

## 2022-01-28 NOTE — ED Notes (Signed)
[facility] here to transport patient to [facility]

## 2022-01-28 NOTE — H&P (Addendum)
History and Physical  Lindsay James is a 31 y.o. [phone] [id number] with Estimated Date of Delivery: 03/08/22 [id number]  admitted for a antibiotic therapy of an infected soft tissue mass left upper inner thigh, present for 36 hours.  I was called earlier today and had [facility] therapy of this infected rather deep soft tissue mass.  It does not feel like a lympohadenitis or lymph node abscess on exam, it feels sor fo like a lipoma that is infected.  It may not be specifically but it is not pointing and is deeper than superficial.  She had a significant leukocytosis which prompted the admission.  I will have general surgery take a look tomorrow and decide on imagine modality and if it would benefit from drainage.  In the meantime she feels better after the antibiotics, Unasyn  She reports good fetal movement no bleeding, no specific obstetric complaints  PMH:    Past Medical History:  Diagnosis Date   Chlamydia    Gonorrhea    High cholesterol    Migraine     PSH:     Past Surgical History:  Procedure Laterality Date   LAPAROSCOPIC APPENDECTOMY N/A 01/18/2020   Procedure: APPENDECTOMY LAPAROSCOPIC;  Surgeon: Franky MachoJenkins, Mark, MD;  Location: [facility];  Service: General;  Laterality: N/A;    POb/GynH:      OB History     Gravida  3   Para  1   Term  1   Preterm      AB  1   Living  1      SAB  1   IAB      Ectopic      Multiple      Live Births  1           SH:   Social History   Tobacco Use   Smoking status: Former    Packs/day: 0.50    Types: Cigarettes, Cigars   Smokeless tobacco: Never   Tobacco comments:    1 black & mild daily.  stopped cigarettes  nov 2021  Vaping Use   Vaping Use: Never used  Substance Use Topics   Alcohol use: Not Currently    Comment: socially   Drug use: Not Currently    Types: Marijuana    FH:    Family History  Problem Relation Age of Onset   Asthma Mother    Arthritis/Rheumatoid  Mother    Cancer Father    Diabetes Maternal Grandmother      Allergies:  Allergies  Allergen Reactions   Peanut-Containing Drug Products Rash    Medications:       Current Facility-Administered Medications:    acetaminophen (TYLENOL) tablet 650 mg, 650 mg, Oral, Q6H PRN, Warden FillersBass, Lawrence A, MD   Ampicillin-Sulbactam (UNASYN) 3 g in sodium chloride 0.9 % 100 mL IVPB, 3 g, Intravenous, Q6H, Warden FillersBass, Lawrence A, MD, Last Rate: 200 mL/hr at 01/28/22 1804, 3 g at 01/28/22 1804   calcium carbonate (TUMS - dosed in mg elemental calcium) chewable tablet 400 mg of elemental calcium, 2 tablet, Oral, Q4H PRN, Mariel AloeBass, Lawrence A, MD   dextrose 5% in lactated ringers with KCl 20 mEq/L infusion, , Intravenous, Continuous, Lazaro ArmsEure, Jadarrius Maselli H, MD, Last Rate: 125 mL/hr at 01/28/22 1802, New Bag at 01/28/22 1802   [START ON 01/29/2022] docusate sodium (COLACE) capsule 100 mg, 100 mg, Oral, Daily, Warden FillersBass, Lawrence A, MD   oxyCODONE (Oxy IR/ROXICODONE) immediate release tablet  5 mg, 5 mg, Oral, Q4H PRN, Warden Fillers, MD, 5 mg at 01/28/22 2128   [START ON 01/29/2022] prenatal multivitamin tablet 1 tablet, 1 tablet, Oral, Q1200, Warden Fillers, MD  Facility-Administered Medications Ordered in Other Encounters:    acetaminophen (TYLENOL) tablet 650 mg, 650 mg, Oral, Q4H PRN, Despina Hidden, Amaryllis Dyke, MD   Ampicillin-Sulbactam (UNASYN) 3 g in sodium chloride 0.9 % 100 mL IVPB, 3 g, Intravenous, Q6H, Denesha Brouse, Amaryllis Dyke, MD   calcium carbonate (TUMS - dosed in mg elemental calcium) chewable tablet 400 mg of elemental calcium, 2 tablet, Oral, Q4H PRN, Lazaro Arms, MD   dextrose 5% in lactated ringers with KCl 20 mEq/L infusion, , Intravenous, Continuous, Trevor Wilkie, Amaryllis Dyke, MD   docusate sodium (COLACE) capsule 100 mg, 100 mg, Oral, Daily, Pallie Swigert, Amaryllis Dyke, MD   enoxaparin (LOVENOX) injection 40 mg, 40 mg, Subcutaneous, Q24H, Jak Haggar, Amaryllis Dyke, MD   prenatal multivitamin tablet 1 tablet, 1 tablet, Oral, Q1200, Lazaro Arms, MD  Review  of Systems:   Review of Systems  Constitutional: Negative for fever, chills, weight loss, malaise/fatigue and diaphoresis.  HENT: Negative for hearing loss, ear pain, nosebleeds, congestion, sore throat, neck pain, tinnitus and ear discharge.   Eyes: Negative for blurred vision, double vision, photophobia, pain, discharge and redness.  Respiratory: Negative for cough, hemoptysis, sputum production, shortness of breath, wheezing and stridor.   Cardiovascular: Negative for chest pain, palpitations, orthopnea, claudication, leg swelling and PND.  Gastrointestinal: Positive for abdominal pain. Negative for heartburn, nausea, vomiting, diarrhea, constipation, blood in stool and melena.  Genitourinary: Negative for dysuria, urgency, frequency, hematuria and flank pain.  Musculoskeletal: Negative for myalgias, back pain, joint pain and falls.  Skin: Negative for itching and rash.  Neurological: Negative for dizziness, tingling, tremors, sensory change, speech change, focal weakness, seizures, loss of consciousness, weakness and headaches.  Endo/Heme/Allergies: Negative for environmental allergies and polydipsia. Does not bruise/bleed easily.  Psychiatric/Behavioral: Negative for depression, suicidal ideas, hallucinations, memory loss and substance abuse. The patient is not nervous/anxious and does not have insomnia.      PHYSICAL EXAM:  Blood pressure 110/62, pulse (!) 112, temperature 98.6 F (37 C), temperature source Oral, resp. rate (!) 21, height 5\' 4"  (1.626 m), weight (!) 143.3 kg, SpO2 99 %.    Vitals reviewed. Constitutional: She is oriented to person, place, and time. She appears well-developed and well-nourished.  HENT:  Head: Normocephalic and atraumatic.  Right Ear: External ear normal.  Left Ear: External ear normal.  Nose: Nose normal.  Mouth/Throat: Oropharynx is clear and moist.  Eyes: Conjunctivae and EOM are normal. Pupils are equal, round, and reactive to light. Right eye  exhibits no discharge. Left eye exhibits no discharge. No scleral icterus.  Neck: Normal range of motion. Neck supple. No tracheal deviation present. No thyromegaly present.  Cardiovascular: Normal rate, regular rhythm, normal heart sounds and intact distal pulses.  Exam reveals no gallop and no friction rub.   No murmur heard. Respiratory: Effort normal and breath sounds normal. No respiratory distress. She has no wheezes. She has no rales. She exhibits no tenderness.  GI: Soft. Bowel sounds are normal. She exhibits no distension and no mass. There is tenderness. There is no rebound and no guarding.  Genitourinary:       Vulva is normal without lesions Vagina is pink moist without discharge Cervix normal in appearance and pap is normal Uterus is enlarged, 35 weeks size Adnexa is negative with normal sized ovaries by  sonogram  Musculoskeletal: Normal range of motion. She exhibits no edema and no tenderness.  Neurological: She is alert and oriented to person, place, and time. She has normal reflexes. She displays normal reflexes. No cranial nerve deficit. She exhibits normal muscle tone. Coordination normal.  Skin: Skin is warm and dry. No rash noted. No erythema. No pallor.  Psychiatric: She has a normal mood and affect. Her behavior is normal. Judgment and thought content normal.    Labs: Results for orders placed or performed during the hospital encounter of 01/28/22 (from the past 336 hour(s))  CBG monitoring, ED   Collection Time: 01/28/22  9:22 AM  Result Value Ref Range   Glucose-Capillary 87 70 - 99 mg/dL  CBC with Differential   Collection Time: 01/28/22 10:58 AM  Result Value Ref Range   WBC 27.8 (H) 4.0 - 10.5 K/uL   RBC 4.61 3.87 - 5.11 MIL/uL   Hemoglobin 11.0 (L) 12.0 - 15.0 g/dL   HCT 44.0 (L) 10.2 - 72.5 %   MCV 74.4 (L) 80.0 - 100.0 fL   MCH 23.9 (L) 26.0 - 34.0 pg   MCHC 32.1 30.0 - 36.0 g/dL   RDW 36.6 44.0 - 34.7 %   Platelets 234 150 - 400 K/uL   nRBC 0.0 0.0 -  0.2 %   Neutrophils Relative % 88 %   Neutro Abs 24.7 (H) 1.7 - 7.7 K/uL   Lymphocytes Relative 3 %   Lymphs Abs 0.8 0.7 - 4.0 K/uL   Monocytes Relative 8 %   Monocytes Absolute 2.1 (H) 0.1 - 1.0 K/uL   Eosinophils Relative 0 %   Eosinophils Absolute 0.0 0.0 - 0.5 K/uL   Basophils Relative 0 %   Basophils Absolute 0.0 0.0 - 0.1 K/uL   WBC Morphology MILD LEFT SHIFT (1-5% METAS, OCC MYELO, OCC BANDS)    Smear Review MORPHOLOGY UNREMARKABLE    Immature Granulocytes 1 %   Abs Immature Granulocytes 0.20 (H) 0.00 - 0.07 K/uL   Polychromasia PRESENT   Protime-INR   Collection Time: 01/28/22 10:58 AM  Result Value Ref Range   Prothrombin Time 13.4 11.4 - 15.2 seconds   INR 1.0 0.8 - 1.2  Lactic acid, plasma   Collection Time: 01/28/22  2:58 PM  Result Value Ref Range   Lactic Acid, Venous 1.3 0.5 - 1.9 mmol/L  Type and screen [facility]   Collection Time: 01/28/22  5:07 PM  Result Value Ref Range   ABO/RH(D) O POS    Antibody Screen NEG    Sample Expiration      01/31/2022,2359 Performed at [facility], [address], [place], [place] [address]   Results for orders placed or performed in visit on 01/28/22 (from the past 336 hour(s))  Comprehensive metabolic panel   Collection Time: 01/28/22  5:07 PM  Result Value Ref Range   Sodium 136 135 - 145 mmol/L   Potassium 3.4 (L) 3.5 - 5.1 mmol/L   Chloride 101 98 - 111 mmol/L   CO2 22 22 - 32 mmol/L   Glucose, Bld 104 (H) 70 - 99 mg/dL   BUN 7 6 - 20 mg/dL   Creatinine, Ser 6.38 0.44 - 1.00 mg/dL   Calcium 9.1 8.9 - 75.6 mg/dL   Total Protein 6.1 (L) 6.5 - 8.1 g/dL   Albumin 2.4 (L) 3.5 - 5.0 g/dL   AST 17 15 - 41 U/L   ALT 12 0 - 44 U/L   Alkaline Phosphatase  83 38 - 126 U/L   Total Bilirubin 0.5 0.3 - 1.2 mg/dL   GFR, Estimated >16 >10 mL/min   Anion gap 13 5 - 15  Results for orders placed or performed in visit on 01/18/22 (from the past 336 hour(s))  Trichomonas vaginalis, RNA   Collection Time:  01/18/22  2:57 AM   UR  Result Value Ref Range   Trich vag by NAA Negative Negative    EKG: Orders placed or performed during the hospital encounter of 07/08/21   EKG 12-Lead   EKG 12-Lead   EKG    Imaging Studies: US OB Follow Up  Result Date: 01/21/2022 Table formatting from the original result was not included. Images from the original result were not included.  ..an [employer] of Ultrasound Medicine Technical sales engineer) accredited practice Center for [facility] [address] [place] [address] Ordering Provider: Jacklyn Shell, CNM FOLLOW UP SONOGRAM Lindsay James is in the office for a follow up sonogram for EFW. She is a 31 y.o. year old [id number] with Estimated Date of Delivery: 03/08/22 by early ultrasound now at  [redacted]w[redacted]d weeks gestation. Thus far the pregnancy has been complicated by obesity . GESTATION: SINGLETON PRESENTATION: cephalic FETAL ACTIVITY:          Heart rate         138          The fetus is active. AMNIOTIC FLUID: The amniotic fluid volume is  normal, 18.3 cm. PLACENTA LOCALIZATION:  anterior GRADE 1 CERVIX: Measures 2.9 cm ADNEXA: The ovaries are normal. GESTATIONAL AGE AND  BIOMETRICS: Gestational criteria: Estimated Date of Delivery: 03/08/22 by early ultrasound now at [redacted]w[redacted]d Previous Scans:4          BIPARIETAL DIAMETER           7.91 cm         31+5 weeks HEAD CIRCUMFERENCE           30.24 cm         33+4 weeks ABDOMINAL CIRCUMFERENCE           28.57 cm         32+4 weeks FEMUR LENGTH           6.33 cm         32+5 weeks                                                       AVERAGE EGA(BY THIS SCAN):  32+5 weeks                                                 ESTIMATED FETAL WEIGHT:       2021  grams, 31 % ANATOMICAL SURVEY                                                                            COMMENTS CEREBRAL  VENTRICLES yes normal  CHOROID PLEXUS yes normal  CEREBELLUM yes normal  CISTERNA MAGNA  Yes  normal   CAVUM SEPTI PELLUCIDI  YES NORMAL                  FACIAL PROFILE yes normal  4 CHAMBERED HEART yes normal  OUTFLOW TRACTS YES normaL  3VV YES NORMAL  3VTV YES NORMAL  SITUS YES NORMAL      DIAPHRAGM yes normal  STOMACH yes normal  RENAL REGION yes normal  BLADDER yes normal          3 VESSEL CORD yes normal              GENITALIA yes normal female     SUSPECTED ABNORMALITIES:  no QUALITY OF SCAN: satisfactory TECHNICIAN COMMENTS: [place] 33 wks,cephalic,cx 2.9 cm,anterior placenta gr 1,AFI 18.3 cm,normal ovaries,FHR 138 bpm,EFW 2021 g 31% A copy of this report including all images has been saved and backed up to a second source for retrieval if needed. All measures and details of the anatomical scan, placentation, fluid volume and pelvic anatomy are contained in that report. Amber Flora Lipps 01/18/2022 10:42 AM Clinical Impression and recommendations: I have reviewed the sonogram results above, combined with the patient's current clinical course, below are my impressions and any appropriate recommendations for management based on the sonographic findings. 1.  [id number] Estimated Date of Delivery: 03/08/22 by serial sonographic evaluations 2.  Fetal sonographic surveillance findings: a). Normal fluid volume b). Normal growth percentile with appropriate interval growth:  31% 3.  Normal general sonographic findings Recommend continued prenatal evaluations and care based on this sonogram and as clinically indicated from the patient's clinical course. Amaryllis Dyke Lafawn Lenoir 01/21/2022 6:10 PM     Lindsay James is at [redacted]w[redacted]d Estimated Date of Delivery: 03/08/22  NST being performed due to admission for infected left leg mass  Today the NST is Reactive  Fetal Monitoring:  Baseline: 140 bpm, Variability: Good {> 6 bpm), Accelerations: Reactive, and Decelerations: Absent   reactive  The accelerations are >15 bpm and more than 2 in 20 minutes  Final diagnosis:  Reactive NST  Lazaro Arms, MD    Assessment: [redacted]w[redacted]d Estimated Date of Delivery: 03/08/22   G3P1011 Left upper inner thigh soft tissue infection/?deep abscess  Plan: Unasyn 3 grams q8h Repeat WBC in am General surgery consult in am to discuss preferred imagine modality and recommendations for drainage  Lazaro Arms 01/28/2022 9:47 PM

## 2022-01-28 NOTE — ED Provider Notes (Signed)
[facility] EMERGENCY DEPARTMENT Provider Note   CSN: [id number] Arrival date & time: 01/28/22  0825     History  Chief Complaint  Patient presents with   Abscess    Lindsay James is a 31 y.o. female.  [redacted] weeks pregnant who presents to the ED for evaluation of a mass in her left groin.  Patient states she has had left groin pain for the past 2 weeks, however noticed a mass today.  Patient states it is warm to the touch.  Denies fevers, chills.  States she called her OB 2 weeks ago regarding left groin pain but required no additional work-up at that time.  Abscess      Home Medications Prior to Admission medications   Medication Sig Start Date End Date Taking? Authorizing Provider  acetaminophen (TYLENOL) 500 MG tablet Take 500 mg by mouth every 6 (six) hours as needed.    [provider]  aspirin 81 MG chewable tablet Chew 2 tablets (162 mg total) by mouth daily. Patient not taking: Reported on 09/22/2021 09/01/21   Arabella Merles, CNM  Blood Pressure Monitor MISC For regular home bp monitoring during pregnancy Patient not taking: Reported on 10/20/2021 09/01/21   Arabella Merles, CNM  cetirizine (ZYRTEC ALLERGY) 10 MG tablet Take 1 tablet (10 mg total) by mouth daily. Patient not taking: Reported on 01/18/2022 12/28/21   Leath-Warren, Sadie Haber, NP  doxylamine, Sleep, (UNISOM) 25 MG tablet Take 25 mg by mouth at bedtime as needed. Patient not taking: Reported on 01/18/2022    [provider]  fluticasone (FLONASE) 50 MCG/ACT nasal spray Place 2 sprays into both nostrils daily. Patient not taking: Reported on 01/18/2022 12/28/21   Leath-Warren, Sadie Haber, NP  metroNIDAZOLE (FLAGYL) 500 MG tablet Take 1 tablet (500 mg total) by mouth 2 (two) times daily. Patient not taking: Reported on 01/18/2022 12/21/21   Myna Hidalgo, DO  ondansetron (ZOFRAN) 4 MG tablet Take 1 tablet (4 mg total) by mouth every 6 (six) hours. Patient not taking: Reported on 10/20/2021  08/30/21   Adline Potter, NP  vitamin B-6 (PYRIDOXINE) 25 MG tablet Take 25 mg by mouth daily. Patient not taking: Reported on 01/18/2022    [provider]  promethazine (PHENERGAN) 25 MG tablet Take 1 tablet (25 mg total) by mouth every 6 (six) hours as needed for nausea or vomiting. 07/22/19 10/28/19  Gilda Crease, MD      Allergies    Peanut-containing drug products    Review of Systems   Review of Systems  Skin:  Positive for wound.  All other systems reviewed and are negative.   Physical Exam Updated Vital Signs BP (!) 113/58 (BP Location: Right Arm)   Pulse (!) 104   Temp 99.5 F (37.5 C) (Oral)   Resp 20   Ht 5\' 4"  (1.626 m)   Wt (!) 140.6 kg   LMP  (LMP Unknown)   SpO2 100%   BMI 53.21 kg/m  Physical Exam Vitals and nursing note reviewed.  Constitutional:      General: She is not in acute distress.    Appearance: Normal appearance. She is normal weight. She is not ill-appearing.  HENT:     Head: Normocephalic and atraumatic.  Pulmonary:     Effort: Pulmonary effort is normal. No respiratory distress.  Abdominal:     General: Abdomen is flat.  Musculoskeletal:        General: Normal range of motion.  Cervical back: Neck supple.  Skin:    General: Skin is warm and dry.     Capillary Refill: Capillary refill takes less than 2 seconds.     Comments: Deep indurated area of left groin measuring 4 cm by 4 cm. No fluctuance or erythema noted over skin  Neurological:     General: No focal deficit present.     Mental Status: She is alert and oriented to person, place, and time.  Psychiatric:        Mood and Affect: Mood normal.        Behavior: Behavior normal.     ED Results / Procedures / Treatments   Labs (all labs ordered are listed, but only abnormal results are displayed) Labs Reviewed - No data to display  EKG None  Radiology No results found.  Procedures Procedures    Medications Ordered in ED Medications - No data  to display  ED Course/ Medical Decision Making/ A&P Clinical Course as of 01/28/22 1521  Sun Jan 28, 2022  1021 Spoke with general surgery Dr. Lovell Sheehan.  He agrees to come evaluate the patient at bedside [AS]  1243 Spoke with Dr. Despina Hidden, he recommends admission to [facility] for further evaluation of abscess, leukocytosis, tachycardia, and low-grade fever [AS]    Clinical Course User Index [AS] Enis Riecke, Edsel Petrin, PA-C                           Medical Decision Making Amount and/or Complexity of Data Reviewed Labs: ordered.  Risk OTC drugs. Decision regarding hospitalization.  This patient presents to the ED for concern of abscess of left groin, this involves an extensive number of treatment options, and is a complaint that carries with it a high risk of complications and morbidity.  The differential diagnosis includes abscess, hematoma, lymphadenopathy, lymphangitis   Co morbidities that complicate the patient evaluation   [redacted] weeks pregnant  My initial workup includes basic labs, bedside ultrasound  Additional history obtained from: Nursing notes from this visit. Previous records within EMR system routine prenatal visit on 12/21/2021 with Dr. Charlotta Newton  I ordered, reviewed and interpreted labs which include: CBC, INR  Bedside ultrasound performed which did show approximately 4 cm x 4 cm anechoic region just inferior to the inguinal fold on the left.  Region did show some loculation suspicious for abscess.  Area of question is approximately 2 cm below the skin and extends to 5 cm below the skin.  Cardiac Monitoring:  The patient was maintained on a cardiac monitor.  I personally viewed and interpreted the cardiac monitored which showed an underlying rhythm of: Sinus tach  Consultations Obtained:  I requested consultation with the general surgery Dr. Lovell Sheehan,  and discussed lab and imaging findings as well as pertinent plan - they recommend: Consult to OB,  patient is [redacted] weeks pregnant and it would be unwise to pursue further imaging versus bedside procedure  I requested consultation with OB Dr. Despina Hidden, who recommends [facility] to [facility] for further evaluation.   Low grade fever, tachycardic.  Patient is a 31 year old female [redacted] weeks pregnant who presents ED for evaluation possible abscess to the left groin as stated above.  Lab work-up remarkable for leukocytosis of 27.8.  Physical exam remarkable for area of questionable abscess as described above.  Patient will be admitted to the [facility] as described above.  Patient was given Tylenol for  low-grade fever.  Lactate was ordered and normal.  Patient was transferred via ambulance.  Stable at the time of transfer  Patient's case discussed with Dr. Jearld Fenton who agrees with plan to admit to [facility].  Note: Portions of this report may have been transcribed using voice recognition software. Every effort was made to ensure accuracy; however, inadvertent computerized transcription errors may still be present.          Final Clinical Impression(s) / ED Diagnoses Final diagnoses:  None    Rx / DC Orders ED Discharge Orders     None         Michelle Piper, Cordelia Poche 01/28/22 1537    Loetta Rough, MD 01/28/22 [phone]

## 2022-01-29 ENCOUNTER — Encounter (HOSPITAL_COMMUNITY): Payer: Self-pay | Admitting: Obstetrics and Gynecology

## 2022-01-29 DIAGNOSIS — Z3A34 34 weeks gestation of pregnancy: Secondary | ICD-10-CM

## 2022-01-29 DIAGNOSIS — L0291 Cutaneous abscess, unspecified: Secondary | ICD-10-CM

## 2022-01-29 DIAGNOSIS — Z6841 Body Mass Index (BMI) 40.0 and over, adult: Secondary | ICD-10-CM

## 2022-01-29 LAB — CBC WITH DIFFERENTIAL/PLATELET
Abs Immature Granulocytes: 0.19 10*3/uL — ABNORMAL HIGH (ref 0.00–0.07)
Basophils Absolute: 0 10*3/uL (ref 0.0–0.1)
Basophils Relative: 0 %
Eosinophils Absolute: 0.1 10*3/uL (ref 0.0–0.5)
Eosinophils Relative: 0 %
HCT: 28.8 % — ABNORMAL LOW (ref 36.0–46.0)
Hemoglobin: 9.5 g/dL — ABNORMAL LOW (ref 12.0–15.0)
Immature Granulocytes: 1 %
Lymphocytes Relative: 7 %
Lymphs Abs: 1.4 10*3/uL (ref 0.7–4.0)
MCH: 24.1 pg — ABNORMAL LOW (ref 26.0–34.0)
MCHC: 33 g/dL (ref 30.0–36.0)
MCV: 73.1 fL — ABNORMAL LOW (ref 80.0–100.0)
Monocytes Absolute: 1.7 10*3/uL — ABNORMAL HIGH (ref 0.1–1.0)
Monocytes Relative: 9 %
Neutro Abs: 16.5 10*3/uL — ABNORMAL HIGH (ref 1.7–7.7)
Neutrophils Relative %: 83 %
Platelets: 204 10*3/uL (ref 150–400)
RBC: 3.94 MIL/uL (ref 3.87–5.11)
RDW: 15.1 % (ref 11.5–15.5)
WBC: 19.9 10*3/uL — ABNORMAL HIGH (ref 4.0–10.5)
nRBC: 0 % (ref 0.0–0.2)

## 2022-01-29 LAB — VITAMIN B12: Vitamin B-12: 159 pg/mL — ABNORMAL LOW (ref 180–914)

## 2022-01-29 LAB — IRON AND TIBC
Iron: 51 ug/dL (ref 28–170)
Saturation Ratios: 13 % (ref 10.4–31.8)
TIBC: 392 ug/dL (ref 250–450)
UIBC: 341 ug/dL

## 2022-01-29 LAB — RETICULOCYTES
Immature Retic Fract: 27.8 % — ABNORMAL HIGH (ref 2.3–15.9)
RBC.: 4.3 MIL/uL (ref 3.87–5.11)
Retic Count, Absolute: 84.3 10*3/uL (ref 19.0–186.0)
Retic Ct Pct: 2 % (ref 0.4–3.1)

## 2022-01-29 LAB — FOLATE: Folate: 6.6 ng/mL (ref >5.9)

## 2022-01-29 LAB — FERRITIN: Ferritin: 52 ng/mL (ref 11–307)

## 2022-01-29 MED ORDER — ONDANSETRON 4 MG PO TBDP: 4.0000 mg | ORAL_TABLET | Freq: Three times a day (TID) | ORAL | Status: DC | PRN

## 2022-01-29 NOTE — [place] Notes (Signed)
Daily Antepartum Note  Admission Date: 01/28/2022 Current Date: 01/29/2022 8:53 AM  Lindsay James is a 31 y.o. G3P1011 @ [redacted]w[redacted]d, HD#2, admitted for left thigh ?abscess.  Pregnancy complicated by: Patient Active Problem List   Diagnosis Date Noted   Left groin mass 01/28/2022   Abscess 01/28/2022   Lymph node abscess 01/28/2022   Obesity affecting pregnancy 10/20/2021   Alpha thalassemia silent carrier 09/18/2021   Trichomonal vaginitis in pregnancy 09/07/2021   Encounter for supervision of normal pregnancy, antepartum 08/31/2021   History of prior pregnancy with short cervix, currently pregnant     Overnight/24hr events:  See h&p  Subjective:  Patient feels stable with her discomfort at her left thigh. No OB s/s  Objective:    Current Vital Signs 24h Vital Sign Ranges  T 98.3 F (36.8 C) Temp  Avg: 98.9 F (37.2 C)  Min: 98 F (36.7 C)  Max: 100.2 F (37.9 C)  BP (!) 105/50 BP  Min: 100/40  Max: 122/64  HR 94 Pulse  Avg: 107.5  Min: 94  Max: 115  RR 16 Resp  Avg: 18.7  Min: 16  Max: 21  SaO2 98 % Room Air SpO2  Avg: 97.7 %  Min: 95 %  Max: 99 %       24 Hour I/O Current Shift I/O  Time Ins Outs 11/12 0701 - 11/13 0700 In: -  Out: 3450 [Urine:3450] 11/13 0701 - 11/13 1900 In: -  Out: 500 [Urine:500]   Fetal Heart Tones: 145 baseline, +accels, no decel, mod variability Tocometry: quiet  Physical exam: General: Well nourished, well developed female in no acute distress. Extremities: anterior/medial left thigh there is what feels to be a 4cm fluctuant area, moderately with normal overlying skin; approximately 12inchs from the mid left inguinal crease. no clubbing, cyanosis or edema Skin: Warm and dry.  Abdomen: gravid, obese, nttp Cardiovascular: S1, S2 normal, no murmur, rub or gallop, regular rate and rhythm Respiratory: CTAB  Medications: Current Facility-Administered Medications  Medication Dose Route Frequency Provider Last Rate Last Admin    acetaminophen (TYLENOL) tablet 650 mg  650 mg Oral Q6H PRN Warden Fillers, MD   650 mg at 01/29/22 0439   Ampicillin-Sulbactam (UNASYN) 3 g in sodium chloride 0.9 % 100 mL IVPB  3 g Intravenous Q6H Warden Fillers, MD 200 mL/hr at 01/29/22 0547 3 g at 01/29/22 0547   calcium carbonate (TUMS - dosed in mg elemental calcium) chewable tablet 400 mg of elemental calcium  2 tablet Oral Q4H PRN Warden Fillers, MD       dextrose 5% in lactated ringers with KCl 20 mEq/L infusion   Intravenous Continuous Lazaro Arms, MD 125 mL/hr at 01/29/22 0300 New Bag at 01/29/22 0300   docusate sodium (COLACE) capsule 100 mg  100 mg Oral Daily Warden Fillers, MD       oxyCODONE (Oxy IR/ROXICODONE) immediate release tablet 5 mg  5 mg Oral Q4H PRN Warden Fillers, MD   5 mg at 01/28/22 2128   prenatal multivitamin tablet 1 tablet  1 tablet Oral Q1200 Warden Fillers, MD       Facility-Administered Medications Ordered in Other Encounters  Medication Dose Route Frequency Provider Last Rate Last Admin   acetaminophen (TYLENOL) tablet 650 mg  650 mg Oral Q4H PRN Lazaro Arms, MD       Ampicillin-Sulbactam (UNASYN) 3 g in sodium chloride 0.9 % 100 mL IVPB  3 g Intravenous Q6H Eure,  Mertie Clause, MD       calcium carbonate (TUMS - dosed in mg elemental calcium) chewable tablet 400 mg of elemental calcium  2 tablet Oral Q4H PRN Florian Buff, MD       dextrose 5% in lactated ringers with KCl 20 mEq/L infusion   Intravenous Continuous Eure, Mertie Clause, MD       docusate sodium (COLACE) capsule 100 mg  100 mg Oral Daily Florian Buff, MD       enoxaparin (LOVENOX) injection 40 mg  40 mg Subcutaneous Q24H Florian Buff, MD       prenatal multivitamin tablet 1 tablet  1 tablet Oral Q1200 Florian Buff, MD       Labs:  Recent Labs  Lab 01/28/22 1058 01/29/22 0403  WBC 27.8* 19.9*  HGB 11.0* 9.5*  HCT 34.3* 28.8*  PLT 234 204    Recent Labs  Lab 01/28/22 1707  NA 136  K 3.4*  CL 101  CO2 22  BUN 7   CREATININE 0.93  CALCIUM 9.1  PROT 6.1*  BILITOT 0.5  ALKPHOS 83  ALT 12  AST 17  GLUCOSE 104*    Radiology:  none  Assessment & Plan:  Patient stable *Pregnancy: reactive NST. Continue qday NSTs *Left thigh mass: appears to be stable from admit. WBC trending down. Continue unasyn D#2. S/p GSU consult at [facility]. Will consult with them today for any further recs, final plans *Preterm: no issues *PPx: lovenox, SCDs *FEN/GI: will d/c MIVF. Continue regular diet *Dispo: once on PO meds  Durene Romans MD Attending Center for [place] (Faculty Practice) GYN Consult Phone: [phone] (M-F, [phone]) & [phone]  (Off hours, weekends, holidays)

## 2022-01-29 NOTE — [place] Notes (Signed)
Received all call about this patient possibly having a left thigh/inguinal mass.  No imaging so will order CT and follow up to determine if this is something that requires surgical intervention.  Letha Cape 10:10 AM 01/29/2022

## 2022-01-30 ENCOUNTER — Encounter (HOSPITAL_COMMUNITY): Payer: Self-pay | Admitting: Obstetrics and Gynecology

## 2022-01-30 ENCOUNTER — Inpatient Hospital Stay (HOSPITAL_COMMUNITY): Payer: Medicaid Other

## 2022-01-30 DIAGNOSIS — L0291 Cutaneous abscess, unspecified: Secondary | ICD-10-CM

## 2022-01-30 DIAGNOSIS — E538 Deficiency of other specified B group vitamins: Secondary | ICD-10-CM | POA: Diagnosis present

## 2022-01-30 LAB — CBC WITH DIFFERENTIAL/PLATELET
Abs Immature Granulocytes: 0.13 10*3/uL — ABNORMAL HIGH (ref 0.00–0.07)
Basophils Absolute: 0 10*3/uL (ref 0.0–0.1)
Basophils Relative: 0 %
Eosinophils Absolute: 0.1 10*3/uL (ref 0.0–0.5)
Eosinophils Relative: 1 %
HCT: 30.9 % — ABNORMAL LOW (ref 36.0–46.0)
Hemoglobin: 9.7 g/dL — ABNORMAL LOW (ref 12.0–15.0)
Immature Granulocytes: 1 %
Lymphocytes Relative: 10 %
Lymphs Abs: 1.6 10*3/uL (ref 0.7–4.0)
MCH: 23.4 pg — ABNORMAL LOW (ref 26.0–34.0)
MCHC: 31.4 g/dL (ref 30.0–36.0)
MCV: 74.6 fL — ABNORMAL LOW (ref 80.0–100.0)
Monocytes Absolute: 1.4 10*3/uL — ABNORMAL HIGH (ref 0.1–1.0)
Monocytes Relative: 9 %
Neutro Abs: 12.8 10*3/uL — ABNORMAL HIGH (ref 1.7–7.7)
Neutrophils Relative %: 79 %
Platelets: 236 10*3/uL (ref 150–400)
RBC: 4.14 MIL/uL (ref 3.87–5.11)
RDW: 15.3 % (ref 11.5–15.5)
WBC: 16.1 10*3/uL — ABNORMAL HIGH (ref 4.0–10.5)
nRBC: 0 % (ref 0.0–0.2)

## 2022-01-30 MED ORDER — VITAMIN B-12 1000 MCG PO TABS
1000.0000 ug | ORAL_TABLET | Freq: Every day | ORAL | Status: DC
  Administered 2022-01-30 – 2022-01-31 (×2): 1000 ug via ORAL
  Filled 2022-01-30 (×2): qty 1

## 2022-01-30 NOTE — [place] Notes (Signed)
Daily Antepartum Note  Admission Date: 01/28/2022 Current Date: 01/30/2022 7:46 AM  Lindsay James is a 31 y.o. G3P1011 at [redacted]w[redacted]d, HD#3, admitted for left thigh ?abscess.  Pregnancy complicated by: Patient Active Problem List   Diagnosis Date Noted   B12 deficiency 01/30/2022   BMI 50.0-59.9, adult (HCC) 01/29/2022   Left groin mass 01/28/2022   Abscess 01/28/2022   Lymph node abscess 01/28/2022   Obesity affecting pregnancy 10/20/2021   Alpha thalassemia silent carrier 09/18/2021   Trichomonal vaginitis in pregnancy 09/07/2021   Encounter for supervision of normal pregnancy, antepartum 08/31/2021   History of prior pregnancy with short cervix, currently pregnant     Overnight/24hr events:  None  Subjective:  Patient feels stable with her discomfort at her left thigh. No OB s/s  Objective:    Current Vital Signs 24h Vital Sign Ranges  T 98.8 F (37.1 C) Temp  Avg: 98.6 F (37 C)  Min: 98.2 F (36.8 C)  Max: 99 F (37.2 C)  BP (!) 107/49 BP  Min: 99/56  Max: 115/60  HR 96 Pulse  Avg: 100.5  Min: 91  Max: 112  RR 18 Resp  Avg: 17  Min: 15  Max: 18  SaO2 100 % Room Air SpO2  Avg: 99 %  Min: 98 %  Max: 100 %       24 Hour I/O Current Shift I/O  Time Ins Outs 11/13 0701 - 11/14 0700 In: -  Out: 4750 [Urine:4750] No intake/output data recorded.   Fetal Heart Tones: 135 baseline, +accels, no decel, mod variability Tocometry: quiet  Physical exam: General: Well nourished, well developed female in no acute distress. Extremities: stable from yesterday>anterior/medial left thigh there is what feels to be a 4cm fluctuant area, moderately with normal overlying skin; approximately 12inchs from the mid left inguinal crease. no clubbing, cyanosis or edema Skin: Warm and dry.  Abdomen: gravid, obese, nttp Respiratory: no respiratory distress  Medications: Current Facility-Administered Medications  Medication Dose Route Frequency Provider Last Rate Last Admin    acetaminophen (TYLENOL) tablet 650 mg  650 mg Oral Q6H PRN Warden Fillers, MD   650 mg at 01/29/22 1943   Ampicillin-Sulbactam (UNASYN) 3 g in sodium chloride 0.9 % 100 mL IVPB  3 g Intravenous Q6H Warden Fillers, MD 200 mL/hr at 01/30/22 0558 3 g at 01/30/22 0558   calcium carbonate (TUMS - dosed in mg elemental calcium) chewable tablet 400 mg of elemental calcium  2 tablet Oral Q4H PRN Warden Fillers, MD       cyanocobalamin (VITAMIN B12) tablet 1,000 mcg  1,000 mcg Oral Daily Natural Steps Bing, MD       docusate sodium (COLACE) capsule 100 mg  100 mg Oral Daily Mariel Aloe A, MD   100 mg at 01/29/22 1007   ondansetron (ZOFRAN-ODT) disintegrating tablet 4 mg  4 mg Oral Q8H PRN [place] Bing, MD       oxyCODONE (Oxy IR/ROXICODONE) immediate release tablet 5 mg  5 mg Oral Q4H PRN Warden Fillers, MD   5 mg at 01/29/22 1015   prenatal multivitamin tablet 1 tablet  1 tablet Oral Q1200 Warden Fillers, MD   1 tablet at 01/29/22 1007   Facility-Administered Medications Ordered in Other Encounters  Medication Dose Route Frequency Provider Last Rate Last Admin   acetaminophen (TYLENOL) tablet 650 mg  650 mg Oral Q4H PRN Lazaro Arms, MD       Ampicillin-Sulbactam (UNASYN) 3 g in sodium  chloride 0.9 % 100 mL IVPB  3 g Intravenous Q6H Lazaro Arms, MD       calcium carbonate (TUMS - dosed in mg elemental calcium) chewable tablet 400 mg of elemental calcium  2 tablet Oral Q4H PRN Lazaro Arms, MD       dextrose 5% in lactated ringers with KCl 20 mEq/L infusion   Intravenous Continuous Lazaro Arms, MD       docusate sodium (COLACE) capsule 100 mg  100 mg Oral Daily Lazaro Arms, MD       enoxaparin (LOVENOX) injection 40 mg  40 mg Subcutaneous Q24H Lazaro Arms, MD       prenatal multivitamin tablet 1 tablet  1 tablet Oral Q1200 Lazaro Arms, MD       Labs:  Recent Labs  Lab 01/28/22 1707  NA 136  K 3.4*  CL 101  CO2 22  BUN 7  CREATININE 0.93  CALCIUM 9.1  PROT 6.1*   BILITOT 0.5  ALKPHOS 83  ALT 12  AST 17  GLUCOSE 104*    Radiology:  none  Assessment & Plan:  Patient stable *Pregnancy: reactive NST. Continue qday NSTs *Left thigh mass: appears to be stable from admit. WBC trending down. Continue unasyn D#3. S/p GSU consult at [facility]. Gen Surg yesterday recommended CT scan. Not done yet. Will have RN to call radiology to get it done today and if looks okay, touch base with Gen Surg and hopefully can transition to PO abx *Preterm: no issues *PPx: lovenox, SCDs *FEN/GI: regular diet *Dispo: hopefully tomorrow  Cornelia Copa MD Attending Center for [facility] (Faculty Practice) GYN Consult Phone: [phone] (M-F, [phone]) & [phone]  (Off hours, weekends, holidays)

## 2022-01-30 NOTE — Consult Note (Signed)
Lindsay James 1990-06-29  [id number].    Requesting MD: Dr. Mendon Bingharlie Pickens Chief Complaint/Reason for Consult: left thigh abscess  HPI:  This is a pleasant 31 yo morbidly obese black female who is 35 weeks gravid who has been having about 2-2.5 weeks of left thigh discomfort and pain with standing at her job.  She denies any fevers, chills, malaise, etc.   This past weekend she was able to feel this abnormality in her left thigh and became concerned about it.  It was increasingly painful.  She presented to the MAU where she was admitted with a WBC of 27K and started on Unasyn.  She underwent a CT scan that revealed a 6x7x9cm well-circumscribed oval lesion that is felt to likely represent hematoma or abscess.  A solid mass including a large necrotic lymph node is less likely.  We have been asked to see her for further evaluation.  ROS: ROS: Please see HPI  Family History  Problem Relation Age of Onset   Asthma Mother    Arthritis/Rheumatoid Mother    Cancer Father    Diabetes Maternal Grandmother     Past Medical History:  Diagnosis Date   Acute appendicitis    Chlamydia    Gonorrhea    High cholesterol    Migraine     Past Surgical History:  Procedure Laterality Date   LAPAROSCOPIC APPENDECTOMY N/A 01/18/2020   Procedure: APPENDECTOMY LAPAROSCOPIC;  Surgeon: Franky MachoJenkins, Mark, MD;  Location: [facility];  Service: General;  Laterality: N/A;    Social History:  reports that she has quit smoking. Her smoking use included cigarettes and cigars. She smoked an average of .5 packs per day. She has never used smokeless tobacco. She reports that she does not currently use alcohol. She reports that she does not currently use drugs after having used the following drugs: Marijuana.  Allergies:  Allergies  Allergen Reactions   Peanut-Containing Drug Products Rash    Medications Prior to Admission  Medication Sig Dispense Refill   acetaminophen (TYLENOL) 500 MG tablet Take 500 mg by  mouth every 6 (six) hours as needed.     ACETAMINOPHEN-CODEINE PO Take 1 tablet by mouth every 6 (six) hours as needed (pain and cough).     cetirizine (ZYRTEC ALLERGY) 10 MG tablet Take 1 tablet (10 mg total) by mouth daily. 30 tablet 0   fluticasone (FLONASE) 50 MCG/ACT nasal spray Place 2 sprays into both nostrils daily. 16 g 0     Physical Exam: Blood pressure (!) 101/53, pulse 97, temperature 98.1 F (36.7 C), temperature source Oral, resp. rate 18, height 5\' 4"  (1.626 m), weight (!) 143.3 kg, SpO2 98 %. General: pleasant, morbidly obese female who is laying in bed in NAD HEENT: head is normocephalic, atraumatic.  Sclera are noninjected.  PERRL.  Ears and nose without any masses or lesions.  Mouth is pink and moist Heart: regular, rate, and rhythm.  Normal s1,s2. No obvious murmurs, gallops, or rubs noted.  Palpable radial and pedal pulses bilaterally Lungs: CTAB, no wheezes, rhonchi, or rales noted.  Respiratory effort nonlabored Abd: soft, NT, obese, but gravid, +BS, no masses, or hernias noted MS: all 4 extremities are symmetrical with no cyanosis, clubbing, or edema, except the left medial thigh has a large mass type lesion that is palpable.  This is not necessarily fluctuance.  There is no erythema or induration over the top of this.   Psych: A&Ox3 with an appropriate affect.   Results for  orders placed or performed during the hospital encounter of 01/28/22 (from the past 48 hour(s))  CBC with Differential     Status: Abnormal   Collection Time: 01/28/22 10:58 AM  Result Value Ref Range   WBC 27.8 (H) 4.0 - 10.5 K/uL   RBC 4.61 3.87 - 5.11 MIL/uL   Hemoglobin 11.0 (L) 12.0 - 15.0 g/dL   HCT 29.1 (L) 91.6 - 60.6 %   MCV 74.4 (L) 80.0 - 100.0 fL   MCH 23.9 (L) 26.0 - 34.0 pg   MCHC 32.1 30.0 - 36.0 g/dL   RDW 00.4 59.9 - 77.4 %   Platelets 234 150 - 400 K/uL   nRBC 0.0 0.0 - 0.2 %   Neutrophils Relative % 88 %   Neutro Abs 24.7 (H) 1.7 - 7.7 K/uL   Lymphocytes Relative 3 %    Lymphs Abs 0.8 0.7 - 4.0 K/uL   Monocytes Relative 8 %   Monocytes Absolute 2.1 (H) 0.1 - 1.0 K/uL   Eosinophils Relative 0 %   Eosinophils Absolute 0.0 0.0 - 0.5 K/uL   Basophils Relative 0 %   Basophils Absolute 0.0 0.0 - 0.1 K/uL   WBC Morphology MILD LEFT SHIFT (1-5% METAS, OCC MYELO, OCC BANDS)     Comment: VACUOLATED NEUTROPHILS   Smear Review MORPHOLOGY UNREMARKABLE    Immature Granulocytes 1 %   Abs Immature Granulocytes 0.20 (H) 0.00 - 0.07 K/uL   Polychromasia PRESENT     Comment: Performed at [facility], [address] [place], [place] [address]  Protime-INR     Status: None   Collection Time: 01/28/22 10:58 AM  Result Value Ref Range   Prothrombin Time 13.4 11.4 - 15.2 seconds   INR 1.0 0.8 - 1.2    Comment: (NOTE) INR goal varies based on device and disease states. Performed at [facility], [address] [place], [place] [address]   Lactic acid, plasma     Status: None   Collection Time: 01/28/22  2:58 PM  Result Value Ref Range   Lactic Acid, Venous 1.3 0.5 - 1.9 mmol/L    Comment: Performed at [facility], [address], [place], [place] [address]  Type and screen [facility]     Status: None   Collection Time: 01/28/22  5:07 PM  Result Value Ref Range   ABO/RH(D) O POS    Antibody Screen NEG    Sample Expiration      01/31/2022,2359 Performed at [facility], [address], [place], [place] [address]   CBC with Differential/Platelet     Status: Abnormal   Collection Time: 01/29/22  4:03 AM  Result Value Ref Range   WBC 19.9 (H) 4.0 - 10.5 K/uL   RBC 3.94 3.87 - 5.11 MIL/uL   Hemoglobin 9.5 (L) 12.0 - 15.0 g/dL   HCT 83.7 (L) 29.0 - 21.1 %   MCV 73.1 (L) 80.0 - 100.0 fL   MCH 24.1 (L) 26.0 - 34.0 pg   MCHC 33.0 30.0 - 36.0 g/dL   RDW 15.5 20.8 - 02.2 %   Platelets 204 150 - 400 K/uL   nRBC 0.0 0.0 - 0.2 %   Neutrophils Relative % 83 %   Neutro Abs 16.5 (H) 1.7 - 7.7 K/uL   Lymphocytes Relative 7 %   Lymphs Abs 1.4 0.7  - 4.0 K/uL   Monocytes Relative 9 %   Monocytes Absolute 1.7 (H) 0.1 - 1.0 K/uL   Eosinophils Relative 0 %  Eosinophils Absolute 0.1 0.0 - 0.5 K/uL   Basophils Relative 0 %   Basophils Absolute 0.0 0.0 - 0.1 K/uL   Immature Granulocytes 1 %   Abs Immature Granulocytes 0.19 (H) 0.00 - 0.07 K/uL    Comment: Performed at [facility], [address] [place], [place] [address]  Vitamin B12     Status: Abnormal   Collection Time: 01/29/22  7:14 PM  Result Value Ref Range   Vitamin B-12 159 (L) 180 - 914 pg/mL    Comment: (NOTE) This assay is not validated for testing neonatal or myeloproliferative syndrome specimens for Vitamin B12 levels. Performed at [facility], [address] [place], [place] [address]   Folate     Status: None   Collection Time: 01/29/22  7:14 PM  Result Value Ref Range   Folate 6.6 >5.9 ng/mL    Comment: Performed at [facility], [address], [place], [place] [address]  Iron and TIBC     Status: None   Collection Time: 01/29/22  7:14 PM  Result Value Ref Range   Iron 51 28 - 170 ug/dL   TIBC 063 016 - 010 ug/dL   Saturation Ratios 13 10.4 - 31.8 %   UIBC 341 ug/dL    Comment: Performed at [facility], [address], [place], [place] [address]  Ferritin     Status: None   Collection Time: 01/29/22  7:14 PM  Result Value Ref Range   Ferritin 52 11 - 307 ng/mL    Comment: Performed at [facility], [address], [place], [place] [address]  Reticulocytes     Status: Abnormal   Collection Time: 01/29/22  7:14 PM  Result Value Ref Range   Retic Ct Pct 2.0 0.4 - 3.1 %   RBC. 4.30 3.87 - 5.11 MIL/uL   Retic Count, Absolute 84.3 19.0 - 186.0 K/uL   Immature Retic Fract 27.8 (H) 2.3 - 15.9 %    Comment: Performed at [facility], [address], [place], [place] [address]  CBC with Differential/Platelet     Status: Abnormal   Collection Time: 01/30/22  5:44 AM  Result Value Ref Range   WBC 16.1 (H) 4.0 - 10.5  K/uL   RBC 4.14 3.87 - 5.11 MIL/uL   Hemoglobin 9.7 (L) 12.0 - 15.0 g/dL   HCT 70.6 (L) 23.7 - 62.8 %   MCV 74.6 (L) 80.0 - 100.0 fL   MCH 23.4 (L) 26.0 - 34.0 pg   MCHC 31.4 30.0 - 36.0 g/dL   RDW 31.5 17.6 - 16.0 %   Platelets 236 150 - 400 K/uL   nRBC 0.0 0.0 - 0.2 %   Neutrophils Relative % 79 %   Neutro Abs 12.8 (H) 1.7 - 7.7 K/uL   Lymphocytes Relative 10 %   Lymphs Abs 1.6 0.7 - 4.0 K/uL   Monocytes Relative 9 %   Monocytes Absolute 1.4 (H) 0.1 - 1.0 K/uL   Eosinophils Relative 1 %   Eosinophils Absolute 0.1 0.0 - 0.5 K/uL   Basophils Relative 0 %   Basophils Absolute 0.0 0.0 - 0.1 K/uL   Immature Granulocytes 1 %   Abs Immature Granulocytes 0.13 (H) 0.00 - 0.07 K/uL    Comment: Performed at [facility], [address], [place], [place] [address]   CT EXTREMITY LOWER LEFT WO CONTRAST  Result Date: 01/30/2022 CLINICAL DATA:  Deep left thigh tender  soft tissue mass. Elevated white blood cells. EXAM: CT OF THE LOWER LEFT EXTREMITY WITHOUT CONTRAST TECHNIQUE: Multidetector CT imaging of the lower left extremity was performed according to the standard protocol. RADIATION DOSE REDUCTION: This exam was performed according to the departmental dose-optimization program which includes automated exposure control, adjustment of the mA and/or kV according to patient size and/or use of iterative reconstruction technique. COMPARISON:  Left knee radiographs 10/10/2017; Ob ultrasound 01/18/2022 FINDINGS: Despite efforts by the technologist and patient, mild motion artifact is present on today's exam and could not be eliminated. This reduces exam sensitivity and specificity. Bones/Joint/Cartilage Mild-to-moderate medial compartment of the knee joint space narrowing and peripheral degenerative osteophytes. Minimal peripheral medial compartment knee and patellofemoral degenerative osteophytes. The pubic symphysis joint space measures 14 mm in width, mildly widened. No acute fracture is seen.  Ligaments Suboptimally assessed by CT. Muscles and Tendons Peripherally calcified structure within the partially visualized lower pelvis compatible with the patient's pregnant status; recent 01/18/2022 ultrasound demonstrated a baby with estimated gestational age of approximately 33 weeks. Soft tissues No knee joint effusion. Centered just anteromedial to the left sartorius and adductor longus, anterior to the gracilis, and exerting mass effect on the anterior aspect of the adductor longus and medial aspect of the sartorius, there is a fairly well-circumscribed lesion of 40 Hounsfield unit internal density within the proximal anteromedial left thigh measuring up to approximately 6.1 x 6.9 x 9.1 cm (transverse by AP by craniocaudal, as measured on axial series 4, image 98 and sagittal series 10, image 111). Patient large body habitus and quantum mottling artifact limit evaluation of the internal density/texture of this lesion. IMPRESSION: 1. Centered just anteromedial to the left sartorius and adductor longus, and exerting mild mass effect on the anterior aspect of the adductor longus and medial aspect of the sartorius, there is a fairly well-circumscribed, moderately dense oval lesion. Differential considerations include a hematoma or abscess. A solid mass including a large necrotic lymph node is also possible but felt less likely. Recommend clinical correlation. Given elevated white blood cell count and tenderness, recommend clinical correlation for possible abscess. 2. No acute fracture is seen. 3. Mild pubic symphysis diastasis. Electronically Signed   By: Neita Garnet M.D.   On: 01/30/2022 09:34      Assessment/Plan Left thigh mass, most likely abscess The patient has been seen, examined, chart, labs, vitals, and imaging have been personally reviewed.  She appears to have a lesion in the medial aspect of her proximal left thigh.  This has no erythema, induration, or over fluctuance to it.  It feels more  solid, but agree with imaging that given WBC, it is possible this is still an abscess or infected hematoma.  We will plan to take her to the OR later today for I&D of this area.  She is agreeable to this procedure.     FEN - NPO, last ate at 9:45am VTE - per primary ID - unasyn  35 weeks gravid  I reviewed last 24 h vitals and pain scores, last 48 h intake and output, last 24 h labs and trends, and last 24 h imaging results.  Letha Cape, [facility] Surgery 01/30/2022, 10:50 AM Please see Amion for pager number during day hours 7:00am-4:30pm or 7:00am -11:30am on weekends

## 2022-01-31 ENCOUNTER — Encounter (HOSPITAL_COMMUNITY): Payer: Self-pay | Admitting: Obstetrics and Gynecology

## 2022-01-31 ENCOUNTER — Other Ambulatory Visit: Payer: Self-pay

## 2022-01-31 ENCOUNTER — Encounter: Payer: Self-pay | Admitting: Obstetrics and Gynecology

## 2022-01-31 ENCOUNTER — Inpatient Hospital Stay (HOSPITAL_COMMUNITY): Payer: Medicaid Other | Admitting: Certified Registered Nurse Anesthetist

## 2022-01-31 ENCOUNTER — Encounter (HOSPITAL_COMMUNITY)
Admission: EM | Disposition: A | Discharge status: Home / Self Care | Payer: Self-pay | Source: Home / Self Care | Attending: Obstetrics and Gynecology

## 2022-01-31 DIAGNOSIS — Z87891 Personal history of nicotine dependence: Secondary | ICD-10-CM | POA: Diagnosis not present

## 2022-01-31 DIAGNOSIS — J45909 Unspecified asthma, uncomplicated: Secondary | ICD-10-CM

## 2022-01-31 DIAGNOSIS — L02416 Cutaneous abscess of left lower limb: Secondary | ICD-10-CM

## 2022-01-31 DIAGNOSIS — E119 Type 2 diabetes mellitus without complications: Secondary | ICD-10-CM

## 2022-01-31 HISTORY — PX: IRRIGATION AND DEBRIDEMENT ABSCESS: SHX5252

## 2022-01-31 LAB — CBC WITH DIFFERENTIAL/PLATELET
Abs Immature Granulocytes: 0.13 10*3/uL — ABNORMAL HIGH (ref 0.00–0.07)
Basophils Absolute: 0 10*3/uL (ref 0.0–0.1)
Basophils Relative: 0 %
Eosinophils Absolute: 0.1 10*3/uL (ref 0.0–0.5)
Eosinophils Relative: 1 %
HCT: 29.2 % — ABNORMAL LOW (ref 36.0–46.0)
Hemoglobin: 9.7 g/dL — ABNORMAL LOW (ref 12.0–15.0)
Immature Granulocytes: 1 %
Lymphocytes Relative: 12 %
Lymphs Abs: 1.8 10*3/uL (ref 0.7–4.0)
MCH: 24.2 pg — ABNORMAL LOW (ref 26.0–34.0)
MCHC: 33.2 g/dL (ref 30.0–36.0)
MCV: 72.8 fL — ABNORMAL LOW (ref 80.0–100.0)
Monocytes Absolute: 1.3 10*3/uL — ABNORMAL HIGH (ref 0.1–1.0)
Monocytes Relative: 9 %
Neutro Abs: 11.2 10*3/uL — ABNORMAL HIGH (ref 1.7–7.7)
Neutrophils Relative %: 77 %
Platelets: 234 10*3/uL (ref 150–400)
RBC: 4.01 MIL/uL (ref 3.87–5.11)
RDW: 15.2 % (ref 11.5–15.5)
WBC: 14.6 10*3/uL — ABNORMAL HIGH (ref 4.0–10.5)
nRBC: 0 % (ref 0.0–0.2)

## 2022-01-31 SURGERY — IRRIGATION AND DEBRIDEMENT ABSCESS
Anesthesia: General | Laterality: Left

## 2022-01-31 MED ORDER — ONDANSETRON HCL 4 MG/2ML IJ SOLN
INTRAMUSCULAR | Status: AC
  Filled 2022-01-31: qty 2

## 2022-01-31 MED ORDER — FENTANYL CITRATE (PF) 100 MCG/2ML IJ SOLN: 25.0000 ug | INTRAMUSCULAR | Status: DC | PRN

## 2022-01-31 MED ORDER — PHENYLEPHRINE HCL-NACL 20-0.9 MG/250ML-% IV SOLN
INTRAVENOUS | Status: DC | PRN
  Administered 2022-01-31: 50 ug/min via INTRAVENOUS

## 2022-01-31 MED ORDER — FENTANYL CITRATE (PF) 250 MCG/5ML IJ SOLN
INTRAMUSCULAR | Status: AC
  Filled 2022-01-31: qty 5

## 2022-01-31 MED ORDER — ONDANSETRON HCL 4 MG/2ML IJ SOLN
INTRAMUSCULAR | Status: DC | PRN
  Administered 2022-01-31: 4 mg via INTRAVENOUS

## 2022-01-31 MED ORDER — PROPOFOL 10 MG/ML IV BOLUS
INTRAVENOUS | Status: AC
  Filled 2022-01-31: qty 20

## 2022-01-31 MED ORDER — BUPIVACAINE-EPINEPHRINE (PF) 0.25% -1:200000 IJ SOLN
INTRAMUSCULAR | Status: DC | PRN
  Administered 2022-01-31: 50 mL

## 2022-01-31 MED ORDER — ORAL CARE MOUTH RINSE: 15.0000 mL | Freq: Once | OROMUCOSAL | Status: AC

## 2022-01-31 MED ORDER — LACTATED RINGERS IV SOLN: INTRAVENOUS | Status: DC

## 2022-01-31 MED ORDER — BUPIVACAINE-EPINEPHRINE (PF) 0.25% -1:200000 IJ SOLN
INTRAMUSCULAR | Status: AC
  Filled 2022-01-31: qty 30

## 2022-01-31 MED ORDER — SUCCINYLCHOLINE CHLORIDE 200 MG/10ML IV SOSY
PREFILLED_SYRINGE | INTRAVENOUS | Status: DC | PRN
  Administered 2022-01-31: 140 mg via INTRAVENOUS

## 2022-01-31 MED ORDER — CYANOCOBALAMIN 1000 MCG PO TABS: 1000.0000 ug | ORAL_TABLET | Freq: Every day | ORAL | 0 refills | Status: DC

## 2022-01-31 MED ORDER — PHENYLEPHRINE 80 MCG/ML (10ML) SYRINGE FOR IV PUSH (FOR BLOOD PRESSURE SUPPORT)
PREFILLED_SYRINGE | INTRAVENOUS | Status: DC | PRN
  Administered 2022-01-31: 240 ug via INTRAVENOUS
  Administered 2022-01-31: 160 ug via INTRAVENOUS
  Administered 2022-01-31: 80 ug via INTRAVENOUS

## 2022-01-31 MED ORDER — PROPOFOL 10 MG/ML IV BOLUS
INTRAVENOUS | Status: DC | PRN
  Administered 2022-01-31: 200 mg via INTRAVENOUS

## 2022-01-31 MED ORDER — BUPIVACAINE LIPOSOME 1.3 % IJ SUSP
INTRAMUSCULAR | Status: AC
  Filled 2022-01-31: qty 20

## 2022-01-31 MED ORDER — FENTANYL CITRATE (PF) 250 MCG/5ML IJ SOLN
INTRAMUSCULAR | Status: DC | PRN
  Administered 2022-01-31 (×3): 50 ug via INTRAVENOUS
  Administered 2022-01-31: 100 ug via INTRAVENOUS

## 2022-01-31 MED ORDER — DOCUSATE SODIUM 100 MG PO CAPS: 100.0000 mg | ORAL_CAPSULE | Freq: Two times a day (BID) | ORAL | 1 refills | Status: DC | PRN

## 2022-01-31 MED ORDER — OXYCODONE HCL 5 MG PO TABS: 5.0000 mg | ORAL_TABLET | Freq: Four times a day (QID) | ORAL | 0 refills | Status: DC | PRN

## 2022-01-31 MED ORDER — DEXAMETHASONE SODIUM PHOSPHATE 10 MG/ML IJ SOLN
INTRAMUSCULAR | Status: DC | PRN
  Administered 2022-01-31: 5 mg via INTRAVENOUS

## 2022-01-31 MED ORDER — PROMETHAZINE HCL 25 MG/ML IJ SOLN: 6.2500 mg | INTRAMUSCULAR | Status: DC | PRN

## 2022-01-31 MED ORDER — CHLORHEXIDINE GLUCONATE 0.12 % MT SOLN: 15.0000 mL | Freq: Once | OROMUCOSAL | Status: AC

## 2022-01-31 MED ORDER — PROPOFOL 500 MG/50ML IV EMUL
INTRAVENOUS | Status: DC | PRN
  Administered 2022-01-31: 125 ug/kg/min via INTRAVENOUS

## 2022-01-31 MED ORDER — PRENATAL MULTIVITAMIN CH: 1.0000 | ORAL_TABLET | Freq: Every day | ORAL | 1 refills | Status: DC

## 2022-01-31 MED ORDER — CHLORHEXIDINE GLUCONATE 0.12 % MT SOLN
OROMUCOSAL | Status: AC
  Administered 2022-01-31: 15 mL via OROMUCOSAL
  Filled 2022-01-31: qty 15

## 2022-01-31 MED ORDER — 0.9 % SODIUM CHLORIDE (POUR BTL) OPTIME
TOPICAL | Status: DC | PRN
  Administered 2022-01-31: 1000 mL

## 2022-01-31 SURGICAL SUPPLY — 32 items
BAG COUNTER SPONGE SURGICOUNT (BAG) ×1 IMPLANT
BAG SPNG CNTER NS LX DISP (BAG) ×1
BNDG GAUZE DERMACEA FLUFF 4 (GAUZE/BANDAGES/DRESSINGS) IMPLANT
BNDG GZE DERMACEA 4 6PLY (GAUZE/BANDAGES/DRESSINGS)
CANISTER SUCT 3000ML PPV (MISCELLANEOUS) ×1 IMPLANT
COVER SURGICAL LIGHT HANDLE (MISCELLANEOUS) ×1 IMPLANT
DRAPE LAPAROSCOPIC ABDOMINAL (DRAPES) IMPLANT
DRAPE LAPAROTOMY 100X72 PEDS (DRAPES) IMPLANT
ELECT REM PT RETURN 9FT ADLT (ELECTROSURGICAL) ×1
ELECTRODE REM PT RTRN 9FT ADLT (ELECTROSURGICAL) ×1 IMPLANT
GAUZE PAD ABD 8X10 STRL (GAUZE/BANDAGES/DRESSINGS) IMPLANT
GAUZE SPONGE 4X4 12PLY STRL (GAUZE/BANDAGES/DRESSINGS) IMPLANT
GLOVE BIO SURGEON STRL SZ 6.5 (GLOVE) ×1 IMPLANT
GLOVE BIOGEL PI IND STRL 6 (GLOVE) ×1 IMPLANT
GOWN STRL REUS W/ TWL LRG LVL3 (GOWN DISPOSABLE) ×2 IMPLANT
GOWN STRL REUS W/TWL LRG LVL3 (GOWN DISPOSABLE) ×2
IV CATH AUTO 14GX1.75 SAFE ORG (IV SOLUTION) IMPLANT
KIT BASIN OR (CUSTOM PROCEDURE TRAY) ×1 IMPLANT
KIT TURNOVER KIT B (KITS) ×1 IMPLANT
NDL 18GX1X1/2 (RX/OR ONLY) (NEEDLE) IMPLANT
NDL SPNL 18GX3.5 QUINCKE PK (NEEDLE) IMPLANT
NEEDLE 18GX1X1/2 (RX/OR ONLY) (NEEDLE) ×1 IMPLANT
NEEDLE SPNL 18GX3.5 QUINCKE PK (NEEDLE) ×1 IMPLANT
NS IRRIG 1000ML POUR BTL (IV SOLUTION) ×1 IMPLANT
PACK GENERAL/GYN (CUSTOM PROCEDURE TRAY) ×1 IMPLANT
PAD ARMBOARD 7.5X6 YLW CONV (MISCELLANEOUS) ×1 IMPLANT
PENCIL SMOKE EVACUATOR (MISCELLANEOUS) ×1 IMPLANT
SWAB COLLECTION DEVICE MRSA (MISCELLANEOUS) IMPLANT
SWAB CULTURE ESWAB REG 1ML (MISCELLANEOUS) IMPLANT
SYR 20ML LL LF (SYRINGE) IMPLANT
TOWEL GREEN STERILE (TOWEL DISPOSABLE) ×1 IMPLANT
TOWEL GREEN STERILE FF (TOWEL DISPOSABLE) ×1 IMPLANT

## 2022-01-31 NOTE — Anesthesia Procedure Notes (Signed)
Procedure Name: Intubation Date/Time: 01/31/2022 10:28 AM  Performed by: Inda Coke, CRNAPre-anesthesia Checklist: Patient identified, Emergency Drugs available, Suction available, Timeout performed and Patient being monitored Patient Re-evaluated:Patient Re-evaluated prior to induction Oxygen Delivery Method: Circle system utilized Preoxygenation: Pre-oxygenation with 100% oxygen Induction Type: IV induction, Rapid sequence and Cricoid Pressure applied Laryngoscope Size: Mac and 3 Grade View: Grade I Tube type: Oral Tube size: 7.0 mm Number of attempts: 1 Airway Equipment and Method: Stylet Placement Confirmation: ETT inserted through vocal cords under direct vision, positive ETCO2, CO2 detector and breath sounds checked- equal and bilateral Secured at: 22 cm Tube secured with: Tape Dental Injury: Teeth and Oropharynx as per pre-operative assessment

## 2022-01-31 NOTE — Plan of Care (Signed)
Patient to be discharged home with printed instructions. Lindsay Mickiewicz L Deiondra Denley, RN  

## 2022-01-31 NOTE — [place] Notes (Signed)
OB Note  Patient feeling much better. Left thigh has some bruising but per patient feels much better on palpation and I no longer feel the induration/fluctuance. Small pinpoint area where Gen Surg aspirated the fluid collection, approximately 64mL of clear, serous appearing fluid, per them.   She also continues to deny any OB s/s. She was having some uterine contractions on NST when she came back from the OR but, looking back, she had these since admission.  Patient amenable to d/c to home. She has OB follow up already scheduled for tomorrow. No need for outpatient abx per Gen Surg.   Cornelia Copa MD Attending Center for [employer] (Faculty Practice) 01/31/2022 Time: [phone]

## 2022-01-31 NOTE — Progress Notes (Signed)
Daily Antepartum Note  Admission Date: 01/28/2022 Current Date: 01/31/2022 8:51 AM  Lindsay James is a 31 y.o. G3P1011 at [redacted]w[redacted]d, HD#5, admitted for left thigh ?abscess.  Pregnancy complicated by: Patient Active Problem List   Diagnosis Date Noted   B12 deficiency 01/30/2022   BMI 50.0-59.9, adult (HCC) 01/29/2022   Left groin mass 01/28/2022   Abscess 01/28/2022   Lymph node abscess 01/28/2022   Obesity affecting pregnancy 10/20/2021   Alpha thalassemia silent carrier 09/18/2021   Trichomonal vaginitis in pregnancy 09/07/2021   Encounter for supervision of normal pregnancy, antepartum 08/31/2021   History of prior pregnancy with short cervix, currently pregnant     Overnight/24hr events:  S/p Gen Surg Consult yesterday  Subjective:  Patient feels stable with her discomfort at her left thigh. No OB s/s  Objective:    Current Vital Signs 24h Vital Sign Ranges  T 98.3 F (36.8 C) Temp  Avg: 98.4 F (36.9 C)  Min: 98.3 F (36.8 C)  Max: 98.5 F (36.9 C)  BP (!) 115/54 BP  Min: 105/39  Max: 115/54  HR 97 Pulse  Avg: 91.5  Min: 89  Max: 97  RR 18 Resp  Avg: 17.5  Min: 16  Max: 18  SaO2 100 % Room Air SpO2  Avg: 98 %  Min: 95 %  Max: 100 %       24 Hour I/O Current Shift I/O  Time Ins Outs 11/14 0701 - 11/15 0700 In: 800  Out: 1600 [Urine:1600] 11/15 0701 - 11/15 1900 In: 0  Out: 250 [Urine:250]   Fetal Heart Tones: 130 baseline, +accels, no decel, mod variability Tocometry: quiet  Physical exam: General: Well nourished, well developed female in no acute distress. Skin: Warm and dry.  Abdomen: gravid, obese, nttp Respiratory: no respiratory distress  Medications: Current Facility-Administered Medications  Medication Dose Route Frequency Provider Last Rate Last Admin   acetaminophen (TYLENOL) tablet 650 mg  650 mg Oral Q6H PRN Warden Fillers, MD   650 mg at 01/30/22 1516   Ampicillin-Sulbactam (UNASYN) 3 g in sodium chloride 0.9 % 100 mL IVPB  3 g  Intravenous Q6H Warden Fillers, MD 200 mL/hr at 01/31/22 0630 3 g at 01/31/22 0630   calcium carbonate (TUMS - dosed in mg elemental calcium) chewable tablet 400 mg of elemental calcium  2 tablet Oral Q4H PRN Warden Fillers, MD       cyanocobalamin (VITAMIN B12) tablet 1,000 mcg  1,000 mcg Oral Daily Keys Bing, MD   1,000 mcg at 01/30/22 0959   docusate sodium (COLACE) capsule 100 mg  100 mg Oral Daily Warden Fillers, MD   100 mg at 01/30/22 0959   ondansetron (ZOFRAN-ODT) disintegrating tablet 4 mg  4 mg Oral Q8H PRN  Bing, MD       oxyCODONE (Oxy IR/ROXICODONE) immediate release tablet 5 mg  5 mg Oral Q4H PRN Warden Fillers, MD   5 mg at 01/29/22 1015   prenatal multivitamin tablet 1 tablet  1 tablet Oral Q1200 Warden Fillers, MD   1 tablet at 01/30/22 1226   Facility-Administered Medications Ordered in Other Encounters  Medication Dose Route Frequency Provider Last Rate Last Admin   acetaminophen (TYLENOL) tablet 650 mg  650 mg Oral Q4H PRN Lazaro Arms, MD       Ampicillin-Sulbactam (UNASYN) 3 g in sodium chloride 0.9 % 100 mL IVPB  3 g Intravenous Q6H Lazaro Arms, MD  calcium carbonate (TUMS - dosed in mg elemental calcium) chewable tablet 400 mg of elemental calcium  2 tablet Oral Q4H PRN Lazaro Arms, MD       dextrose 5% in lactated ringers with KCl 20 mEq/L infusion   Intravenous Continuous Eure, Amaryllis Dyke, MD       docusate sodium (COLACE) capsule 100 mg  100 mg Oral Daily Lazaro Arms, MD       enoxaparin (LOVENOX) injection 40 mg  40 mg Subcutaneous Q24H Lazaro Arms, MD       prenatal multivitamin tablet 1 tablet  1 tablet Oral Q1200 Lazaro Arms, MD       Labs:     Latest Ref Rng & Units 01/31/2022    5:18 AM 01/30/2022    5:44 AM 01/29/2022    4:03 AM  CBC  WBC 4.0 - 10.5 K/uL 14.6  16.1  19.9   Hemoglobin 12.0 - 15.0 g/dL 9.7  9.7  9.5   Hematocrit 36.0 - 46.0 % 29.2  30.9  28.8   Platelets 150 - 400 K/uL 234  236  204     Radiology:  Narrative & Impression  CLINICAL DATA:  Deep left thigh tender soft tissue mass. Elevated white blood cells.   EXAM: CT OF THE LOWER LEFT EXTREMITY WITHOUT CONTRAST   TECHNIQUE: Multidetector CT imaging of the lower left extremity was performed according to the standard protocol.   RADIATION DOSE REDUCTION: This exam was performed according to the departmental dose-optimization program which includes automated exposure control, adjustment of the mA and/or kV according to patient size and/or use of iterative reconstruction technique.   COMPARISON:  Left knee radiographs 10/10/2017; Ob ultrasound 01/18/2022   FINDINGS: Despite efforts by the technologist and patient, mild motion artifact is present on today's exam and could not be eliminated. This reduces exam sensitivity and specificity.   Bones/Joint/Cartilage   Mild-to-moderate medial compartment of the knee joint space narrowing and peripheral degenerative osteophytes. Minimal peripheral medial compartment knee and patellofemoral degenerative osteophytes.   The pubic symphysis joint space measures 14 mm in width, mildly widened. No acute fracture is seen.   Ligaments   Suboptimally assessed by CT.   Muscles and Tendons   Peripherally calcified structure within the partially visualized lower pelvis compatible with the patient's pregnant status; recent 01/18/2022 ultrasound demonstrated a baby with estimated gestational age of approximately 33 weeks.   Soft tissues   No knee joint effusion.   Centered just anteromedial to the left sartorius and adductor longus, anterior to the gracilis, and exerting mass effect on the anterior aspect of the adductor longus and medial aspect of the sartorius, there is a fairly well-circumscribed lesion of 40 Hounsfield unit internal density within the proximal anteromedial left thigh measuring up to approximately 6.1 x 6.9 x 9.1 cm (transverse by AP by  craniocaudal, as measured on axial series 4, image 98 and sagittal series 10, image 111). Patient large body habitus and quantum mottling artifact limit evaluation of the internal density/texture of this lesion.   IMPRESSION: 1. Centered just anteromedial to the left sartorius and adductor longus, and exerting mild mass effect on the anterior aspect of the adductor longus and medial aspect of the sartorius, there is a fairly well-circumscribed, moderately dense oval lesion. Differential considerations include a hematoma or abscess. A solid mass including a large necrotic lymph node is also possible but felt less likely. Recommend clinical correlation. Given elevated white blood cell count and tenderness, recommend  clinical correlation for possible abscess. 2. No acute fracture is seen. 3. Mild pubic symphysis diastasis.     Electronically Signed   By: Neita Garnet M.D.   On: 01/30/2022 09:34    Assessment & Plan:  Patient stable *Pregnancy: reactive NST. Repeat NST after surgery>>call OB rapid response nurse for non stress test *Left thigh mass: for exploration of area today by general surgery. Thank for evaluation and management.  *Preterm: no issues *PPx: lovenox held, continue SCDs *FEN/GI: NPO *Dispo: hopefully tomorrow  Cornelia Copa MD Attending Center for [facility]) GYN Consult Phone: [phone] (M-F, [phone]) & [phone]  (Off hours, weekends, holidays)

## 2022-01-31 NOTE — Anesthesia Preprocedure Evaluation (Addendum)
Anesthesia Evaluation  Patient identified by MRN, date of birth, ID band Patient awake    Reviewed: Allergy & Precautions, H&P , NPO status , Patient's Chart, lab work & pertinent test results, reviewed documented beta blocker date and time   Airway Mallampati: III  TM Distance: >3 FB Neck ROM: full    Dental no notable dental hx. (+) Teeth Intact, Dental Advisory Given   Pulmonary asthma , former smoker   Pulmonary exam normal breath sounds clear to auscultation       Cardiovascular Exercise Tolerance: Good negative cardio ROS  Rhythm:regular Rate:Normal     Neuro/Psych  Headaches  negative psych ROS   GI/Hepatic negative GI ROS, Neg liver ROS,,,  Endo/Other  negative endocrine ROSdiabetes    Renal/GU negative Renal ROS     Musculoskeletal   Abdominal   Peds  Hematology negative hematology ROS (+)   Anesthesia Other Findings   Reproductive/Obstetrics negative OB ROS                             Anesthesia Physical Anesthesia Plan  ASA: 3  Anesthesia Plan: General   Post-op Pain Management: Ofirmev IV (intra-op)*   Induction: Intravenous, Rapid sequence and Cricoid pressure planned  PONV Risk Score and Plan: 3 and Ondansetron, Dexamethasone, Treatment may vary due to age or medical condition, Propofol infusion and TIVA  Airway Management Planned: Oral ETT  Additional Equipment:   Intra-op Plan:   Post-operative Plan: Extubation in OR  Informed Consent: I have reviewed the patients History and Physical, chart, labs and discussed the procedure including the risks, benefits and alternatives for the proposed anesthesia with the patient or authorized representative who has indicated his/her understanding and acceptance.     Dental advisory given  Plan Discussed with: CRNA  Anesthesia Plan Comments: (Surgeon request for potential spinal. Given her habitus, location of the  abscess, lack of imaging thus unknown extent of abscess, I felt GETA was safest option for the patient. Intraoperative fetal heart tone monitoring available, but OB rapid response said not indicated. Pt requested FHT monitoring, but apparently did not insist. Pt was taken to OR without FHT monitoring. Will eval in PACU.)         Anesthesia Quick Evaluation

## 2022-01-31 NOTE — Anesthesia Postprocedure Evaluation (Signed)
Anesthesia Post Note  Patient: Lindsay James  Procedure(s) Performed: INCISION AND DRAINAGE OF LEFT THIGH ABSCESS (Left)     Patient location during evaluation: PACU Anesthesia Type: General Level of consciousness: sedated and patient cooperative Pain management: pain level controlled Vital Signs Assessment: post-procedure vital signs reviewed and stable Respiratory status: spontaneous breathing Cardiovascular status: stable Anesthetic complications: no   No notable events documented.  Last Vitals:  Vitals:   01/31/22 1229 01/31/22 1549  BP: 118/63 126/65  Pulse: 78 91  Resp: 17 18  Temp: 36.6 C 36.8 C  SpO2: 97% 98%    Last Pain:  Vitals:   01/31/22 1549  TempSrc: Oral  PainSc:                  Lewie Loron

## 2022-01-31 NOTE — Progress Notes (Signed)
Patient requested FHT monitoring during surgery. Per Dr. Gordy Savers note, patient only requires monitoring pre and post procedure. After discussing with patient, she is ok with proceeding without FHT monitoring during the procedure.

## 2022-01-31 NOTE — Transfer of Care (Signed)
Immediate Anesthesia Transfer of Care Note  Patient: Lindsay James  Procedure(s) Performed: INCISION AND DRAINAGE OF LEFT THIGH ABSCESS (Left)  Patient Location: PACU  Anesthesia Type:General  Level of Consciousness: awake, alert , oriented, patient cooperative, and responds to stimulation  Airway & Oxygen Therapy: Patient Spontanous Breathing and Patient connected to nasal cannula oxygen  Post-op Assessment: Report given to RN and Post -op Vital signs reviewed and stable  Post vital signs: Reviewed and stable. Fetal heart tones in PACU  Last Vitals:  Vitals Value Taken Time  BP 115/53 01/31/22 1130  Temp    Pulse 93 01/31/22 1132  Resp 17 01/31/22 1132  SpO2 94 % 01/31/22 1132  Vitals shown include unvalidated device data.  Last Pain:  Vitals:   01/31/22 0930  TempSrc: Oral  PainSc: 0-No pain      Patients Stated Pain Goal: 4 (01/28/22 1730)  Complications: No notable events documented.

## 2022-01-31 NOTE — Progress Notes (Signed)
Dr Vergie Living notified that Lindsay James is stable in PACU.  MD also told that fhr is reassuring but not reactive after surgery.  Lindsay James has had some uterine irritability, but is unaware of it.  She has no obstetrical complains at this time.  Dr Vergie Living gives order for [facility] to perform another NST once Lindsay James is settled in her room. This is reported off to the unit.

## 2022-01-31 NOTE — Progress Notes (Signed)
General Surgery Follow Up Note  Subjective:    Overnight Issues:   Objective:  Vital signs for last 24 hours: Temp:  [98.1 F (36.7 C)-98.5 F (36.9 C)] 98.3 F (36.8 C) (11/15 0331) Pulse Rate:  [89-97] 89 (11/15 0331) Resp:  [16-18] 18 (11/15 0331) BP: (101-114)/(39-56) 105/39 (11/15 0331) SpO2:  [95 %-99 %] 95 % (11/15 0331)  Hemodynamic parameters for last 24 hours:    Intake/Output from previous day: 11/14 0701 - 11/15 0700 In: 800 [IV Piggyback:800] Out: 1600 [Urine:1600]  Intake/Output this shift: No intake/output data recorded.  Vent settings for last 24 hours:    Physical Exam:  Gen: comfortable, no distress Neuro: non-focal exam HEENT: PERRL Neck: supple CV: RRR Pulm: unlabored breathing Abd: soft, NT GU: clear yellow urine Extr: wwp, no edema, L medial thigh mass-tender, non-erythematous   Results for orders placed or performed during the hospital encounter of 01/28/22 (from the past 24 hour(s))  CBC with Differential/Platelet     Status: Abnormal   Collection Time: 01/31/22  5:18 AM  Result Value Ref Range   WBC 14.6 (H) 4.0 - 10.5 K/uL   RBC 4.01 3.87 - 5.11 MIL/uL   Hemoglobin 9.7 (L) 12.0 - 15.0 g/dL   HCT 35.3 (L) 29.9 - 24.2 %   MCV 72.8 (L) 80.0 - 100.0 fL   MCH 24.2 (L) 26.0 - 34.0 pg   MCHC 33.2 30.0 - 36.0 g/dL   RDW 68.3 41.9 - 62.2 %   Platelets 234 150 - 400 K/uL   nRBC 0.0 0.0 - 0.2 %   Neutrophils Relative % 77 %   Neutro Abs 11.2 (H) 1.7 - 7.7 K/uL   Lymphocytes Relative 12 %   Lymphs Abs 1.8 0.7 - 4.0 K/uL   Monocytes Relative 9 %   Monocytes Absolute 1.3 (H) 0.1 - 1.0 K/uL   Eosinophils Relative 1 %   Eosinophils Absolute 0.1 0.0 - 0.5 K/uL   Basophils Relative 0 %   Basophils Absolute 0.0 0.0 - 0.1 K/uL   Immature Granulocytes 1 %   Abs Immature Granulocytes 0.13 (H) 0.00 - 0.07 K/uL    Assessment & Plan: The plan of care was discussed with the bedside nurse for the day, who is in agreement with this plan and no  additional concerns were raised.   Present on Admission:  Left groin mass  Abscess  B12 deficiency    LOS: 3 days   Additional comments:I reviewed the patient's new clinical lab test results.   and I reviewed the patients new imaging test results.    Left thigh mass, suspected abscess - plan for OR for I&D. We discussed the possibility of this requiring packing that will need to be changed at least once daily post-op. Given the appearance on imaging, it is possible that this is a more solid mass, in which case excision for pathologic examination will be performed and this was also discussed with the patient. She verbalizes understanding and is in agreement with proceeding with surgery. Okay to resume diet immediately post-op.      FEN - NPO, okay for regular diet post-op VTE - per primary ID - unasyn   Diamantina Monks, MD Trauma & General Surgery Please use [email or web address] to contact on call provider  01/31/2022  *Care during the described time interval was provided by me. I have reviewed this patient's available data, including medical history, events of note, physical examination and test results as part of my evaluation.

## 2022-01-31 NOTE — Discharge Summary (Signed)
Physician Discharge Summary  Patient ID: Lindsay James MRN: [id number] DOB/AGE: 08/20/1990 31 y.o.  Admit date: 01/28/2022 Discharge date: 01/31/2022  Admission Diagnoses: Pregnancy at 34/3. Left thigh abscess. BMI 50s  Discharge Diagnoses: Same  Discharged Condition: good  Hospital Course: Patient admitted as a transfer from [facility] and continued on Unasyn. 11/14 CT thigh 6x10cm circumcrisbed lesion. On 11/15, she underwent aspiration of fluid (appeared, clear/serous) of 8mL of fluid by General Surgery. Unclear etiology but they didn't believe she needed to stay on antibiotics and was okay for d/c to home same day.  Post op check showed much improvement in patient and she was discharged home that day  Consults: general surgery  Discharge Exam: Blood pressure 126/65, pulse 91, temperature 98.2 F (36.8 C), temperature source Oral, resp. rate 18, height 5\' 4"  (1.626 m), weight (!) 143.3 kg, SpO2 98 %. General appearance: alert Extremities: extremities normal, atraumatic, no cyanosis or edema; Left thigh has some bruising but per patient feels much better on palpation and I no longer feel the induration/fluctuance. Small pinpoint area where Gen Surg aspirated the fluid collection, approximately 37mL of clear, serous appearing fluid, per them.  Skin: Skin color, texture, turgor normal. No rashes or lesions  Disposition: Discharge disposition: 01-Home or Self Care       Discharge Instructions     Admit to Inpatient (patient's expected length of stay will be greater than 2 midnights or inpatient only procedure)   Complete by: As directed    [facility]   Covid Evaluation: Asymptomatic - no recent exposure (last 10 days) testing not required   Diagnosis: Abscess   Level of Care: Med-Surg   Admitting Physician: [facility]   Estimated length of stay: past midnight tomorrow   Certification: I certify this patient will need inpatient  services for at least 2 midnights   Attending Physician: Lazaro Arms   Discharge patient   Complete by: As directed    Discharge disposition: 01-Home or Self Care   Discharge patient date: 01/31/2022      Allergies as of 01/31/2022       Reactions   Peanut-containing Drug Products Rash        Medication List     STOP taking these medications    ACETAMINOPHEN-CODEINE PO       TAKE these medications    acetaminophen 500 MG tablet Commonly known as: TYLENOL Take 500 mg by mouth every 6 (six) hours as needed.   cetirizine 10 MG tablet Commonly known as: ZyrTEC Allergy Take 1 tablet (10 mg total) by mouth daily.   docusate sodium 100 MG capsule Commonly known as: COLACE Take 1 capsule (100 mg total) by mouth 2 (two) times daily as needed for mild constipation.   fluticasone 50 MCG/ACT nasal spray Commonly known as: FLONASE Place 2 sprays into both nostrils daily.   oxyCODONE 5 MG immediate release tablet Commonly known as: Oxy IR/ROXICODONE Take 1 tablet (5 mg total) by mouth every 6 (six) hours as needed for severe pain.   prenatal multivitamin Tabs tablet Take 1 tablet by mouth daily at 12 noon. Start taking on: February 01, 2022        Follow-up Information     [facility]. Go on 02/01/2022.   Specialty: Obstetrics and Gynecology Contact information: [address] [place] [phone]  SignedRandolph Bing 01/31/2022, 4:42 PM

## 2022-01-31 NOTE — Progress Notes (Signed)
[facility] called to do post op fetal heart rate monitoring on pt who  is G3P1 at 34.6wk who had an I&D of left thigh abscess.

## 2022-01-31 NOTE — Progress Notes (Signed)
D/w OB on call and recs for FHTM pre and post-op. Preop FHTM completed at 0900.   Diamantina Monks, MD General and Trauma Surgery [facility] Surgery

## 2022-02-01 ENCOUNTER — Encounter (HOSPITAL_COMMUNITY): Payer: Self-pay | Admitting: Surgery

## 2022-02-01 ENCOUNTER — Ambulatory Visit (INDEPENDENT_AMBULATORY_CARE_PROVIDER_SITE_OTHER): Payer: Medicaid Other | Admitting: Medical

## 2022-02-01 VITALS — BP 126/73 | HR 85 | Wt 321.0 lb

## 2022-02-01 DIAGNOSIS — O09299 Supervision of pregnancy with other poor reproductive or obstetric history, unspecified trimester: Secondary | ICD-10-CM

## 2022-02-01 DIAGNOSIS — D563 Thalassemia minor: Secondary | ICD-10-CM

## 2022-02-01 DIAGNOSIS — Z3480 Encounter for supervision of other normal pregnancy, unspecified trimester: Secondary | ICD-10-CM

## 2022-02-01 DIAGNOSIS — O99213 Obesity complicating pregnancy, third trimester: Secondary | ICD-10-CM

## 2022-02-01 DIAGNOSIS — L0291 Cutaneous abscess, unspecified: Secondary | ICD-10-CM

## 2022-02-01 DIAGNOSIS — Z3A35 35 weeks gestation of pregnancy: Secondary | ICD-10-CM

## 2022-02-01 NOTE — Progress Notes (Signed)
   PRENATAL VISIT NOTE  Subjective:  Lindsay James is a 31 y.o. G3P1011 at [redacted]w[redacted]d being seen today for ongoing prenatal care.  She is currently monitored for the following issues for this high-risk pregnancy and has History of prior pregnancy with short cervix, currently pregnant; Encounter for supervision of normal pregnancy, antepartum; Trichomonal vaginitis in pregnancy; Alpha thalassemia silent carrier; Obesity affecting pregnancy; Left groin mass; Abscess; Lymph node abscess; BMI 50.0-59.9, adult (HCC); and B12 deficiency on their problem list.  Patient reports no complaints.  Contractions: Irregular. Vag. Bleeding: None.  Movement: Present. Denies leaking of fluid.   The following portions of the patient's history were reviewed and updated as appropriate: allergies, current medications, past family history, past medical history, past social history, past surgical history and problem list.   Objective:   Vitals:   02/01/22 0830  BP: 126/73  Pulse: 85  Weight: (!) 321 lb (145.6 kg)    Fetal Status:     Movement: Present     General:  Alert, oriented and cooperative. Patient is in no acute distress.  Skin: Skin is warm and dry. No rash noted.   Cardiovascular: Normal heart rate noted  Respiratory: Normal respiratory effort, no problems with respiration noted  Abdomen: Soft, gravid, appropriate for gestational age.  Pain/Pressure: Present     Pelvic: Cervical exam deferred        Extremities: Normal range of motion.  Edema: None  Mental Status: Normal mood and affect. Normal behavior. Normal judgment and thought content.   Assessment and Plan:  Pregnancy: G3P1011 at [redacted]w[redacted]d 1. Supervision of other normal pregnancy, antepartum - Planning Nexplanon for MOC  2. History of prior pregnancy with short cervix, currently pregnant - Last [place] CL 2.9 cm  3. Severe obesity due to excess calories affecting pregnancy in third trimester (HCC) - [place] 11/2 EFW 31%   4. Alpha thalassemia  silent carrier  5. Left thigh mass - recently admitted for mass, drained by general surgery - patient will pick up antibiotics today, she was discharged from [facility] yesterday   6. [redacted] weeks gestation of pregnancy  Preterm labor symptoms and general obstetric precautions including but not limited to vaginal bleeding, contractions, leaking of fluid and fetal movement were reviewed in detail with the patient. Please refer to After Visit Summary for other counseling recommendations.   Return in about 1 week (around 02/08/2022) for [facility] APP, In-Person.  Future Appointments  Date Time Provider Department Center  02/05/2022 11:10 AM [facility] NURSE [facility]  02/07/2022 11:10 AM [facility] NURSE [facility]  02/07/2022 11:30 AM Myna Hidalgo, DO [facility]  02/15/2022  8:30 AM [facility] [place] [facility] None  02/15/2022  9:30 AM Jacklyn Shell, CNM [facility]  02/22/2022  8:30 AM [facility] [place] [facility] None  02/22/2022  9:30 AM Jacklyn Shell, CNM [facility]  03/01/2022  9:15 AM [facility] [place] [facility] None  03/01/2022 10:10 AM Jacklyn Shell, CNM [facility]  03/08/2022 10:00 AM [facility] [place] [facility] None  03/08/2022 11:10 AM Eure, Amaryllis Dyke, MD [facility]    Vonzella Nipple, PA-C

## 2022-02-01 NOTE — Op Note (Signed)
   Operative Note   Date: 02/01/2022  Procedure: ultrasound guided aspiration of fluid collection, left thigh  Pre-op diagnosis: left thigh mass Post-op diagnosis: complex cyst of left upper thigh  Indication and clinical history: The patient is a 31 y.o. year old female with a new and enlarging left upper thigh mass associated with fever and leukocytosis, suspicious for abscess     Surgeon: Diamantina Monks, MD  Anesthesiologist: Renold Don, MD Anesthesia: General  Findings:  Specimen: fluid sent for cytology, GS&C EBL: <5cc Drains/Implants: none  Disposition: PACU - hemodynamically stable.  Description of procedure: The patient was positioned supine on the operating room table. General anesthetic induction and intubation were uneventful. Time-out was performed verifying correct patient, procedure, signature of informed consent, and administration of pre-operative antibiotics. The left thigh was prepped and draped in the usual sterile fashion.  The procedure began with needle localization of the mass and aspiration. Immediate aspiration revealed clear fluid. An ultrasound was used to perform directed aspiration of the remaining fluid, approximately 350cc of fluid was aspirated that did not appear infectious. The collection of fluid had a complex appearance sonographically. Samples were sent for cytology, gram stain, and culture.   Sterile dressings were applied. All sponge and instrument counts were correct at the conclusion of the procedure. The patient was awakened from anesthesia, extubated uneventfully, and transported to the PACU in good condition. There were no complications.     Diamantina Monks, MD General and Trauma Surgery [facility] Surgery

## 2022-02-02 LAB — CYTOLOGY - NON PAP

## 2022-02-05 ENCOUNTER — Ambulatory Visit (INDEPENDENT_AMBULATORY_CARE_PROVIDER_SITE_OTHER): Payer: Medicaid Other | Admitting: *Deleted

## 2022-02-05 VITALS — BP 106/66 | HR 89 | Wt 320.0 lb

## 2022-02-05 DIAGNOSIS — Z3480 Encounter for supervision of other normal pregnancy, unspecified trimester: Secondary | ICD-10-CM | POA: Diagnosis not present

## 2022-02-05 DIAGNOSIS — Z6841 Body Mass Index (BMI) 40.0 and over, adult: Secondary | ICD-10-CM | POA: Diagnosis not present

## 2022-02-05 DIAGNOSIS — Z3A35 35 weeks gestation of pregnancy: Secondary | ICD-10-CM

## 2022-02-05 LAB — AEROBIC/ANAEROBIC CULTURE W GRAM STAIN (SURGICAL/DEEP WOUND)
Culture: NO GROWTH
Gram Stain: NONE SEEN

## 2022-02-05 NOTE — Progress Notes (Signed)
   NURSE VISIT- NST  SUBJECTIVE:  Lindsay James is a 31 y.o. G51P1011 female at [redacted]w[redacted]d, here for a NST for pregnancy complicated by BMI >40. She reports active fetal movement, contractions: none, vaginal bleeding: none, membranes: intact.   OBJECTIVE:  BP 106/66   Pulse 89   Wt (!) 320 lb (145.2 kg)   LMP  (LMP Unknown)   BMI 54.93 kg/m   Appears well, no apparent distress  No results found for this or any previous visit (from the past 24 hour(s)).  NST: FHR baseline 135 bpm, Variability: moderate, Accelerations:present, Decelerations:  Absent= Cat 1/reactive Toco: none   ASSESSMENT: G3P1011 at [redacted]w[redacted]d with BMI >40 NST reactive  PLAN: EFM strip reviewed by Dr. Charlotta Newton   Recommendations: keep next appointment as scheduled    Jobe Marker  02/05/2022 12:16 PM

## 2022-02-07 ENCOUNTER — Ambulatory Visit (INDEPENDENT_AMBULATORY_CARE_PROVIDER_SITE_OTHER): Payer: Medicaid Other | Admitting: Obstetrics & Gynecology

## 2022-02-07 ENCOUNTER — Ambulatory Visit: Payer: Medicaid Other

## 2022-02-07 ENCOUNTER — Encounter: Payer: Self-pay | Admitting: Obstetrics & Gynecology

## 2022-02-07 VITALS — BP 97/65 | HR 94 | Wt 319.8 lb

## 2022-02-07 DIAGNOSIS — Z3A35 35 weeks gestation of pregnancy: Secondary | ICD-10-CM | POA: Diagnosis not present

## 2022-02-07 DIAGNOSIS — Z3480 Encounter for supervision of other normal pregnancy, unspecified trimester: Secondary | ICD-10-CM

## 2022-02-07 DIAGNOSIS — Z6841 Body Mass Index (BMI) 40.0 and over, adult: Secondary | ICD-10-CM

## 2022-02-07 NOTE — Progress Notes (Signed)
LOW-RISK PREGNANCY VISIT Patient name: Lindsay James MRN [id number]  Date of birth: 08-09-1990 Chief Complaint:   Routine Prenatal Visit and Non-stress Test  History of Present Illness:   Lindsay James is a 31 y.o. G70P1011 female at [redacted]w[redacted]d with an Estimated Date of Delivery: 03/08/22 being seen today for ongoing management of a low-risk pregnancy.   Obesity- BMI 54    11/30/2021    9:07 AM 09/01/2021   10:03 AM 01/10/2021    8:26 AM 02/15/2020    2:50 PM 01/26/2020    9:40 AM  Depression screen PHQ 2/9  Decreased Interest 1 3 0 0 3  Down, Depressed, Hopeless 1 3 0 0 2  PHQ - 2 Score 2 6 0 0 5  Altered sleeping 3 3  1 3   Tired, decreased energy 3 3  1 1   Change in appetite 1 2  1 3   Feeling bad or failure about yourself  0 0  1 2  Trouble concentrating 1 2  0 0  Moving slowly or fidgety/restless 1 3  1  0  Suicidal thoughts 0 0  0 0  PHQ-9 Score 11 19  5 14     Today she reports  backache . Contractions: Not present. Denies vaginal bleeding .  Movement: Present. denies leaking of fluid. Review of Systems:   Pertinent items are noted in HPI Denies abnormal vaginal discharge w/ itching/odor/irritation, headaches, visual changes, shortness of breath, chest pain, abdominal pain, severe nausea/vomiting, or problems with urination or bowel movements unless otherwise stated above. Pertinent History Reviewed:  Reviewed past medical,surgical, social, obstetrical and family history.  Reviewed problem list, medications and allergies.  Physical Assessment:   Vitals:   02/07/22 1146  BP: 97/65  Pulse: 94  Weight: (!) 319 lb 12.8 oz (145.1 kg)  Body mass index is 54.89 kg/m.        Physical Examination:   General appearance: Well appearing, and in no distress  Mental status: Alert, oriented to person, place, and time  Skin: Warm & dry  Respiratory: Normal respiratory effort, no distress  Abdomen: Soft, gravid, nontender  Pelvic: Cervical exam deferred         Extremities:  Edema: None  Psych:  mood and affect appropriate  Fetal Status:     Movement: Present    Chaperone: n/a    NST being performed due to BMI >40   Fetal Monitoring:  Baseline: 140 bpm, Variability: moderate, Accelerations: present, The accelerations are >15 bpm and more than 2 in 20 minutes, and Decelerations: Absent     Final diagnosis:  Reactive NST  No results found for this or any previous visit (from the past 24 hour(s)).   Assessment & Plan:  1) Low-risk pregnancy G3P1011 at [redacted]w[redacted]d with an Estimated Date of Delivery: 03/08/22   2) Obesity- BMI 54 -continue weekly antepartum testing -per MFM recommendations IOL 39-40wk   Meds: No orders of the defined types were placed in this encounter.  Labs/procedures today: NST,  []  due to for GBS next visit  Plan:  Continue routine obstetrical care  Next visit: prefers in person    Reviewed: Preterm labor symptoms and general obstetric precautions including but not limited to vaginal bleeding, contractions, leaking of fluid and fetal movement were reviewed in detail with the patient.  All questions were answered.   Follow-up: Return for as scheduled weekly.   , DO Attending Obstetrician & Gynecologist, [facility] for 02/09/22, [facility]

## 2022-02-15 ENCOUNTER — Ambulatory Visit (INDEPENDENT_AMBULATORY_CARE_PROVIDER_SITE_OTHER): Payer: Medicaid Other | Admitting: Advanced Practice Midwife

## 2022-02-15 ENCOUNTER — Other Ambulatory Visit (HOSPITAL_COMMUNITY)
Admission: RE | Admit: 2022-02-15 | Discharge: 2022-02-15 | Disposition: A | Discharge status: Home / Self Care | Payer: Medicaid Other | Source: Ambulatory Visit | Attending: Advanced Practice Midwife | Admitting: Advanced Practice Midwife

## 2022-02-15 ENCOUNTER — Ambulatory Visit (INDEPENDENT_AMBULATORY_CARE_PROVIDER_SITE_OTHER): Payer: Medicaid Other

## 2022-02-15 VITALS — BP 129/86 | HR 86 | Wt 322.0 lb

## 2022-02-15 DIAGNOSIS — Z3A37 37 weeks gestation of pregnancy: Secondary | ICD-10-CM

## 2022-02-15 DIAGNOSIS — O99213 Obesity complicating pregnancy, third trimester: Secondary | ICD-10-CM

## 2022-02-15 DIAGNOSIS — Z3483 Encounter for supervision of other normal pregnancy, third trimester: Secondary | ICD-10-CM

## 2022-02-15 DIAGNOSIS — Z23 Encounter for immunization: Secondary | ICD-10-CM

## 2022-02-15 DIAGNOSIS — O09299 Supervision of pregnancy with other poor reproductive or obstetric history, unspecified trimester: Secondary | ICD-10-CM

## 2022-02-15 DIAGNOSIS — Z3480 Encounter for supervision of other normal pregnancy, unspecified trimester: Secondary | ICD-10-CM

## 2022-02-15 DIAGNOSIS — O99210 Obesity complicating pregnancy, unspecified trimester: Secondary | ICD-10-CM

## 2022-02-15 NOTE — Progress Notes (Signed)
   LOW-RISK PREGNANCY VISIT Patient name: Lindsay James MRN [id number]  Date of birth: 12/07/1990 Chief Complaint:   Routine Prenatal Visit  History of Present Illness:   Lindsay James is a 31 y.o. G80P1011 female at [redacted]w[redacted]d with an Estimated Date of Delivery: 03/08/22 being seen today for ongoing management of a low-risk pregnancy.  Today she reports no complaints. Contractions: Irritability. Vag. Bleeding: None.  Movement: Present. denies leaking of fluid. Review of Systems:   Pertinent items are noted in HPI Denies abnormal vaginal discharge w/ itching/odor/irritation, headaches, visual changes, shortness of breath, chest pain, abdominal pain, severe nausea/vomiting, or problems with urination or bowel movements unless otherwise stated above. Pertinent History Reviewed:  Reviewed past medical,surgical, social, obstetrical and family history.  Reviewed problem list, medications and allergies. Physical Assessment:   Vitals:   02/15/22 0912  BP: 129/86  Pulse: 86  Weight: (!) 322 lb (146.1 kg)  Body mass index is 55.27 kg/m.        Physical Examination:   General appearance: Well appearing, and in no distress  Mental status: Alert, oriented to person, place, and time  Skin: Warm & dry  Cardiovascular: Normal heart rate noted  Respiratory: Normal respiratory effort, no distress  Abdomen: Soft, gravid, nontender  Pelvic: Cervical exam performed  Dilation: 3 Effacement (%): 50 Station: -2  Extremities: Edema: None  Fetal Status:     Movement: Present  [place] 37 wks,cephalic,BPP 8/8,FHR 154 bpm,anterior placenta gr 1,AFI 12 cm,EFW 2891 g 37%   Chaperone: Amanda Rash    No results found for this or any previous visit (from the past 24 hour(s)).  Assessment & Plan:  1) Low-risk pregnancy G3P1011 at [redacted]w[redacted]d with an Estimated Date of Delivery: 03/08/22   2) Morbid obestiy, weekly BPP; pt wants to get holiday pay, so doesn't want IOL until 12/23;  Scheduled and orders in   Meds: No  orders of the defined types were placed in this encounter.  Labs/procedures today: BPP, EFW;GBS, GC, CHL  Plan:  Continue routine obstetrical care  Next visit: prefers in person    Reviewed: Term labor symptoms and general obstetric precautions including but not limited to vaginal bleeding, contractions, leaking of fluid and fetal movement were reviewed in detail with the patient.  All questions were answered. Has home bp cuff. . Check bp weekly, let us know if >140/90.   Follow-up:   Future Appointments  Date Time Provider Department Center  02/22/2022  8:30 AM [facility] [place] [facility] None  02/22/2022  9:30 AM Jacklyn Shell, CNM [facility]  03/01/2022  8:30 AM [facility] - [facility] [place] [facility] None  03/01/2022  9:30 AM Jacklyn Shell, CNM [facility]  03/08/2022 10:00 AM [facility] - [facility] [place] [facility] None  03/08/2022 11:10 AM Lazaro Arms, MD [facility]  03/10/2022  6:30 AM [facility] SCHED ROOM [facility] None    Orders Placed This Encounter  Procedures   Culture, beta strep (group b only)   Flu Vaccine QUAD 37mo+IM (Fluarix, Fluzone & Alfiuria Quad PF)   Jacklyn Shell DNP, CNM 02/15/2022 9:44 AM

## 2022-02-15 NOTE — Patient Instructions (Signed)
Sciatica Rehab Ask your health care provider which exercises are safe for you. Do exercises exactly as told by your health care provider and adjust them as directed. It is normal to feel mild stretching, pulling, tightness, or discomfort as you do these exercises, but you should stop right away if you feel sudden pain or your pain gets worse. Do not begin these exercises until told by your health care provider. Stretching and range of motion exercises These exercises warm up your muscles and joints and improve the movement and flexibility of your hips and your back. These exercises also help to relieve pain, numbness, and tingling. Exercise A: Sciatic nerve glide  Sit in a chair with your head facing down toward your chest. Place your hands behind your back. Let your shoulders slump forward. Slowly straighten one of your knees while you tilt your head back as if you are looking toward the ceiling. Only straighten your leg as far as you can without making your symptoms worse. Hold for __________ seconds. Slowly return to the starting position. Repeat with your other leg. Repeat __________ times. Complete this exercise __________ times a day. Exercise B: Knee to chest with hip adduction and internal rotation     Lie on your back on a firm surface with both legs straight. Bend one of your knees and move it up toward your chest until you feel a gentle stretch in your lower back and buttock. Then, move your knee toward the shoulder that is on the opposite side from your leg. Hold your leg in this position by holding onto the front of your knee. Hold for __________ seconds. Slowly return to the starting position. Repeat with your other leg. Repeat __________ times. Complete this exercise __________ times a day. Exercise C: Prone extension on elbows     Lie on your abdomen on a firm surface. A bed may be too soft for this exercise. Prop yourself up on your elbows. Use your arms to help lift your  chest up until you feel a gentle stretch in your abdomen and your lower back. This will place some of your body weight on your elbows. If this is uncomfortable, try stacking pillows under your chest. Your hips should stay down, against the surface that you are lying on. Keep your hip and back muscles relaxed. Hold for __________ seconds. Slowly relax your upper body and return to the starting position. Repeat __________ times. Complete this exercise __________ times a day. Strengthening exercises These exercises build strength and endurance in your back. Endurance is the ability to use your muscles for a long time, even after they get tired. Exercise D: Pelvic tilt  Lie on your back on a firm surface. Bend your knees and keep your feet flat. Tense your abdominal muscles. Tip your pelvis up toward the ceiling and flatten your lower back into the floor. To help with this exercise, you may place a small towel under your lower back and try to push your back into the towel. Hold for __________ seconds. Let your muscles relax completely before you repeat this exercise. Repeat __________ times. Complete this exercise __________ times a day. Exercise E: Alternating arm and leg raises     Get on your hands and knees on a firm surface. If you are on a hard floor, you may want to use padding to cushion your knees, such as an exercise mat. Line up your arms and legs. Your hands should be below your shoulders, and your knees should   be below your hips. Lift your left leg behind you. At the same time, raise your right arm and straighten it in front of you. Do not lift your leg higher than your hip. Do not lift your arm higher than your shoulder. Keep your abdominal and back muscles tight. Keep your hips facing the ground. Do not arch your back. Keep your balance carefully, and do not hold your breath. Hold for __________ seconds. Slowly return to the starting position and repeat with your right leg and  your left arm. Repeat __________ times. Complete this exercise __________ times a day. Posture and body mechanics    Body mechanics refers to the movements and positions of your body while you do your daily activities. Posture is part of body mechanics. Good posture and healthy body mechanics can help to relieve stress in your body's tissues and joints. Good posture means that your spine is in its natural S-curve position (your spine is neutral), your shoulders are pulled back slightly, and your head is not tipped forward. The following are general guidelines for applying improved posture and body mechanics to your everyday activities. Standing     When standing, keep your spine neutral and your feet about hip-width apart. Keep a slight bend in your knees. Your ears, shoulders, and hips should line up. When you do a task in which you stand in one place for a long time, place one foot up on a stable object that is 2-4 inches (5-10 cm) high, such as a footstool. This helps keep your spine neutral. Sitting     When sitting, keep your spine neutral and keep your feet flat on the floor. Use a footrest, if necessary, and keep your thighs parallel to the floor. Avoid rounding your shoulders, and avoid tilting your head forward. When working at a desk or a computer, keep your desk at a height where your hands are slightly lower than your elbows. Slide your chair under your desk so you are close enough to maintain good posture. When working at a computer, place your monitor at a height where you are looking straight ahead and you do not have to tilt your head forward or downward to look at the screen. Resting     When lying down and resting, avoid positions that are most painful for you. If you have pain with activities such as sitting, bending, stooping, or squatting (flexion-based activities), lie in a position in which your body does not bend very much. For example, avoid curling up on your side with  your arms and knees near your chest (fetal position). If you have pain with activities such as standing for a long time or reaching with your arms (extension-based activities), lie with your spine in a neutral position and bend your knees slightly. Try the following positions: Lying on your side with a pillow between your knees. Lying on your back with a pillow under your knees. Lifting     When lifting objects, keep your feet at least shoulder-width apart and tighten your abdominal muscles. Bend your knees and hips and keep your spine neutral. It is important to lift using the strength of your legs, not your back. Do not lock your knees straight out. Always ask for help to lift heavy or awkward objects. This information is not intended to replace advice given to you by your health care provider. Make sure you discuss any questions you have with your health care provider. Document Released: 03/05/2005 Document Revised: 11/10/2015 Document   Reviewed: 11/19/2014 Elsevier Interactive Patient Education  2017 [employer]          

## 2022-02-15 NOTE — Progress Notes (Signed)
[place] 37 wks,cephalic,BPP 8/8,FHR 154 bpm,anterior placenta gr 1,AFI 12 cm,EFW 2891 g 37%

## 2022-02-16 LAB — CERVICOVAGINAL ANCILLARY ONLY
Chlamydia: NEGATIVE
Comment: NEGATIVE
Comment: NORMAL
Neisseria Gonorrhea: NEGATIVE

## 2022-02-19 ENCOUNTER — Encounter (HOSPITAL_COMMUNITY): Payer: Self-pay | Admitting: Obstetrics & Gynecology

## 2022-02-19 ENCOUNTER — Other Ambulatory Visit: Payer: Self-pay

## 2022-02-19 ENCOUNTER — Inpatient Hospital Stay (HOSPITAL_COMMUNITY)
Admission: AD | Admit: 2022-02-19 | Discharge: 2022-02-19 | Disposition: A | Discharge status: Home / Self Care | Payer: Medicaid Other | Attending: Obstetrics & Gynecology | Admitting: Obstetrics & Gynecology

## 2022-02-19 DIAGNOSIS — O471 False labor at or after 37 completed weeks of gestation: Secondary | ICD-10-CM | POA: Diagnosis present

## 2022-02-19 DIAGNOSIS — Z3A37 37 weeks gestation of pregnancy: Secondary | ICD-10-CM | POA: Insufficient documentation

## 2022-02-19 DIAGNOSIS — O479 False labor, unspecified: Secondary | ICD-10-CM

## 2022-02-19 DIAGNOSIS — O09299 Supervision of pregnancy with other poor reproductive or obstetric history, unspecified trimester: Secondary | ICD-10-CM

## 2022-02-19 LAB — URINALYSIS, ROUTINE W REFLEX MICROSCOPIC
Bilirubin Urine: NEGATIVE
Glucose, UA: NEGATIVE mg/dL
Hgb urine dipstick: NEGATIVE
Ketones, ur: NEGATIVE mg/dL
Leukocytes,Ua: NEGATIVE
Nitrite: NEGATIVE
Protein, ur: NEGATIVE mg/dL
Specific Gravity, Urine: 1.004 — ABNORMAL LOW (ref 1.005–1.030)
pH: 6 (ref 5.0–8.0)

## 2022-02-19 LAB — CULTURE, BETA STREP (GROUP B ONLY): Strep Gp B Culture: NEGATIVE

## 2022-02-19 NOTE — MAU Note (Signed)
.  Lindsay James is a 31 y.o. at [redacted]w[redacted]d here in MAU reporting: contractions since 12/3 around 1430 that are reportedly q.5-10 min rating them 9/10 on pain scale. Pt. Denies any LOF, VB, and endorses positive FM.  LMP: [place] Onset of complaint: 12/3 1430 Pain score: 9/10 Vitals:   02/19/22 2045  BP: 132/62  Pulse: 66  Resp: 20  Temp: 98.5 F (36.9 C)  SpO2: 99%     FHT:135 Lab orders placed from triage:     UA

## 2022-02-19 NOTE — MAU Provider Note (Signed)
Ms. Lindsay James is a [id number] at [redacted]w[redacted]d seen in MAU for labor. RN labor check, not seen by provider. SVE by RN Dilation: 3 Effacement (%): 50 Station: -3 Exam by:: Valla Leaver, RN   NST - FHR: 125 bpm / moderate variability / accels present / decels absent / TOCO: occasional  Plan:  D/C home with labor precautions Keep scheduled appt with Drenda Freeze on 02/22/22  Celedonio Savage, MD  02/19/2022 10:01 PM

## 2022-02-22 ENCOUNTER — Ambulatory Visit (INDEPENDENT_AMBULATORY_CARE_PROVIDER_SITE_OTHER): Payer: Medicaid Other | Admitting: Obstetrics & Gynecology

## 2022-02-22 ENCOUNTER — Ambulatory Visit (INDEPENDENT_AMBULATORY_CARE_PROVIDER_SITE_OTHER): Payer: Medicaid Other

## 2022-02-22 VITALS — BP 113/54 | HR 77 | Wt 329.0 lb

## 2022-02-22 DIAGNOSIS — O99213 Obesity complicating pregnancy, third trimester: Secondary | ICD-10-CM

## 2022-02-22 DIAGNOSIS — Z3483 Encounter for supervision of other normal pregnancy, third trimester: Secondary | ICD-10-CM | POA: Diagnosis not present

## 2022-02-22 DIAGNOSIS — Z3A38 38 weeks gestation of pregnancy: Secondary | ICD-10-CM

## 2022-02-22 NOTE — Progress Notes (Signed)
[place] 38 wks,cephalic,BPP 8/8,FHR 129 bpm,anterior placenta gr 3,AFI 17.5 cm

## 2022-02-22 NOTE — Progress Notes (Signed)
   LOW-RISK PREGNANCY VISIT Patient name: Lindsay James MRN [id number]  Date of birth: 12-31-1990 Chief Complaint:   Routine Prenatal Visit (Itchy red spots on inside of left thigh )  History of Present Illness:   Lindsay James is a 31 y.o. G53P1011 female at [redacted]w[redacted]d with an Estimated Date of Delivery: 03/08/22 being seen today for ongoing management of a low-risk pregnancy.     11/30/2021    9:07 AM 09/01/2021   10:03 AM 01/10/2021    8:26 AM 02/15/2020    2:50 PM 01/26/2020    9:40 AM  Depression screen PHQ 2/9  Decreased Interest 1 3 0 0 3  Down, Depressed, Hopeless 1 3 0 0 2  PHQ - 2 Score 2 6 0 0 5  Altered sleeping 3 3  1 3   Tired, decreased energy 3 3  1 1   Change in appetite 1 2  1 3   Feeling bad or failure about yourself  0 0  1 2  Trouble concentrating 1 2  0 0  Moving slowly or fidgety/restless 1 3  1  0  Suicidal thoughts 0 0  0 0  PHQ-9 Score 11 19  5 14     Today she reports no complaints. Contractions: Not present. Vag. Bleeding: None.  Movement: Present. denies leaking of fluid. Review of Systems:   Pertinent items are noted in HPI Denies abnormal vaginal discharge w/ itching/odor/irritation, headaches, visual changes, shortness of breath, chest pain, abdominal pain, severe nausea/vomiting, or problems with urination or bowel movements unless otherwise stated above. Pertinent History Reviewed:  Reviewed past medical,surgical, social, obstetrical and family history.  Reviewed problem list, medications and allergies. Physical Assessment:   Vitals:   02/22/22 0914  BP: (!) 113/54  Pulse: 77  Weight: (!) 329 lb (149.2 kg)  Body mass index is 56.47 kg/m.        Physical Examination:   General appearance: Well appearing, and in no distress  Mental status: Alert, oriented to person, place, and time  Skin: Warm & dry  Cardiovascular: Normal heart rate noted  Respiratory: Normal respiratory effort, no distress  Abdomen: Soft, gravid, nontender  Pelvic:  Cervical exam deferred         Extremities: Edema: None  Fetal Status:     Movement: Present    Chaperone: n/a    No results found for this or any previous visit (from the past 24 hour(s)).  Assessment & Plan:  1) Low-risk pregnancy G3P1011 at [redacted]w[redacted]d with an Estimated Date of Delivery: 03/08/22   2) Morbid pbesity, BPP 8/8, IOL per pt request 03/10/22   Meds: No orders of the defined types were placed in this encounter.  Labs/procedures today: sonogram  Plan:  Continue routine obstetrical care weekly BPP Next visit: prefers in person    Reviewed: Term labor symptoms and general obstetric precautions including but not limited to vaginal bleeding, contractions, leaking of fluid and fetal movement were reviewed in detail with the patient.  All questions were answered. Has home bp cuff. Rx faxed to . Check bp weekly, let know if >140/90.   Follow-up: Return for keep scheduled.  No orders of the defined types were placed in this encounter.   14/07/23, MD 02/22/2022 9:47 AM

## 2022-02-26 ENCOUNTER — Inpatient Hospital Stay (HOSPITAL_COMMUNITY)
Admission: AD | Admit: 2022-02-26 | Discharge: 2022-02-27 | Disposition: A | Discharge status: Home / Self Care | Payer: Medicaid Other | Attending: Obstetrics and Gynecology | Admitting: Obstetrics and Gynecology

## 2022-02-26 ENCOUNTER — Encounter (HOSPITAL_COMMUNITY): Payer: Self-pay | Admitting: Obstetrics and Gynecology

## 2022-02-26 DIAGNOSIS — O479 False labor, unspecified: Secondary | ICD-10-CM

## 2022-02-26 DIAGNOSIS — Z3A38 38 weeks gestation of pregnancy: Secondary | ICD-10-CM | POA: Insufficient documentation

## 2022-02-26 DIAGNOSIS — O471 False labor at or after 37 completed weeks of gestation: Secondary | ICD-10-CM | POA: Insufficient documentation

## 2022-02-26 DIAGNOSIS — O26893 Other specified pregnancy related conditions, third trimester: Secondary | ICD-10-CM | POA: Insufficient documentation

## 2022-02-26 DIAGNOSIS — Z3493 Encounter for supervision of normal pregnancy, unspecified, third trimester: Secondary | ICD-10-CM

## 2022-02-26 LAB — POCT FERN TEST
POCT Fern Test: NEGATIVE
POCT Fern Test: NEGATIVE

## 2022-02-26 NOTE — MAU Note (Signed)
.  Lindsay James is a 31 y.o. at [redacted]w[redacted]d here in MAU reporting: contractions since this am, low back pain since last night. Pt also reports she is having to go bathroom more frequently and wants to see if her water has broken.  Onset of complaint: last pm. Pt reports positive fetal movement Pain score: 9/10 contractions                    10/10 back pain Vitals:   02/26/22 2255  BP: 117/64  Pulse: 71  Temp: 98.6 F (37 C)  SpO2: 100%     FHT:130 Lab orders placed from triage:

## 2022-02-26 NOTE — MAU Provider Note (Signed)
History     CSN: [id number]  Arrival date and time: 02/26/22 2223   None     Chief Complaint  Patient presents with   Contractions   Back Pain   Rupture of Membranes   HPI Lindsay James is a 31 y.o. G3P1011 at [redacted]w[redacted]d who presents to MAU for possible rupture of membranes and contractions. She reports she has been leaking for weeks however got worse on Friday. She reports she is not wearing a pad but her underwear are constantly wet and she sweats a lot. Reports contractions every 5 mins. She denies vaginal bleeding. Endorses active fetal movement.  Patient receives [facility] and next appointment is on 12/14.  OB History     Gravida  3   Para  1   Term  1   Preterm      AB  1   Living  1      SAB  1   IAB      Ectopic      Multiple      Live Births  1           Past Medical History:  Diagnosis Date   Acute appendicitis    Chlamydia    Gonorrhea    High cholesterol    Migraine     Past Surgical History:  Procedure Laterality Date   IRRIGATION AND DEBRIDEMENT ABSCESS Left 01/31/2022   Procedure: INCISION AND DRAINAGE OF LEFT THIGH ABSCESS;  Surgeon: Diamantina Monks, MD;  Location: [facility] OR;  Service: General;  Laterality: Left;   LAPAROSCOPIC APPENDECTOMY N/A 01/18/2020   Procedure: APPENDECTOMY LAPAROSCOPIC;  Surgeon: Franky Macho, MD;  Location: [facility] ORS;  Service: General;  Laterality: N/A;    Family History  Problem Relation Age of Onset   Asthma Mother    Arthritis/Rheumatoid Mother    Cancer Father    Diabetes Maternal Grandmother     Social History   Tobacco Use   Smoking status: Former    Packs/day: 0.50    Types: Cigarettes, Cigars   Smokeless tobacco: Never   Tobacco comments:    1 black & mild daily.  stopped cigarettes  nov 2021  Vaping Use   Vaping Use: Never used  Substance Use Topics   Alcohol use: Not Currently    Comment: socially   Drug use: Not Currently    Types: Marijuana    Allergies:   Allergies  Allergen Reactions   Peanut-Containing Drug Products Rash    Medications Prior to Admission  Medication Sig Dispense Refill Last Dose   Prenatal Vit-Fe Fumarate-FA (PRENATAL MULTIVITAMIN) TABS tablet Take 1 tablet by mouth daily at 12 noon. 30 tablet 1 02/26/2022   acetaminophen (TYLENOL) 500 MG tablet Take 500 mg by mouth every 6 (six) hours as needed.      cetirizine (ZYRTEC ALLERGY) 10 MG tablet Take 1 tablet (10 mg total) by mouth daily. 30 tablet 0    cyanocobalamin 1000 MCG tablet Take 1 tablet (1,000 mcg total) by mouth daily. 30 tablet 0 02/23/2022   docusate sodium (COLACE) 100 MG capsule Take 1 capsule (100 mg total) by mouth 2 (two) times daily as needed for mild constipation. 30 capsule 1    fluticasone (FLONASE) 50 MCG/ACT nasal spray Place 2 sprays into both nostrils daily. 16 g 0 02/24/2022   oxyCODONE (OXY IR/ROXICODONE) 5 MG immediate release tablet Take 1 tablet (5 mg total) by mouth every 6 (six) hours as needed for severe pain. (  Patient not taking: Reported on 02/22/2022) 5 tablet 0    Review of Systems  Constitutional: Negative.   Respiratory: Negative.    Cardiovascular: Negative.   Gastrointestinal:  Positive for abdominal pain (contractions).  Genitourinary:  Positive for vaginal discharge.  Musculoskeletal: Negative.   Neurological: Negative.    Physical Exam   Blood pressure 117/64, pulse 71, temperature 98.6 F (37 C), temperature source Oral, height 5\' 4"  (1.626 m), weight (!) 147.4 kg, SpO2 100 %.  Physical Exam Vitals and nursing note reviewed. Exam conducted with a chaperone present.  Constitutional:      Appearance: She is obese.  Eyes:     Extraocular Movements: Extraocular movements intact.     Pupils: Pupils are equal, round, and reactive to light.  Cardiovascular:     Rate and Rhythm: Normal rate.  Pulmonary:     Effort: Pulmonary effort is normal.  Abdominal:     Palpations: Abdomen is soft.     Tenderness: There is no abdominal  tenderness.     Comments: Gravid   Genitourinary:    Comments: Normal external female genitalia, vaginal walls pink with rugae, no pooling of amniotic fluid, scant amount of physiologic discharge, no bleeding, cervix visually 2-3cm without lesions/masses Musculoskeletal:     Cervical back: Normal range of motion.  Neurological:     Mental Status: She is alert.    Dilation: 3 Effacement (%): 50 Station: -2 Presentation: Vertex Exam by:: [email or web address], CNM  Fern: negative  NST FHR: 125 bpm, moderate variability, +15x15 accels, no decels Toco: occasional MAU Course  Procedures  MDM SSE negative for pooling of amniotic fluid. Fern slide negative. Cervix unchanged from previous exam. NST reactive and reassuring with occasional contractions on toco. At this time, low suspicion for active labor or rupture of membranes.  Assessment and Plan  [redacted] weeks gestation of pregnancy Intact amniotic membranes NST reactive  - Discharge home in stable condition - Strict return precautions. Return to MAU as needed for new/worsening symptoms - Keep OB appointment on 12/14  1/15, CNM 02/26/2022, 11:59 PM

## 2022-02-27 DIAGNOSIS — Z3A38 38 weeks gestation of pregnancy: Secondary | ICD-10-CM | POA: Diagnosis not present

## 2022-02-27 DIAGNOSIS — O26893 Other specified pregnancy related conditions, third trimester: Secondary | ICD-10-CM | POA: Diagnosis not present

## 2022-02-27 DIAGNOSIS — O471 False labor at or after 37 completed weeks of gestation: Secondary | ICD-10-CM | POA: Diagnosis not present

## 2022-03-01 ENCOUNTER — Encounter: Payer: Self-pay | Admitting: Obstetrics & Gynecology

## 2022-03-01 ENCOUNTER — Encounter (HOSPITAL_COMMUNITY): Payer: Self-pay

## 2022-03-01 ENCOUNTER — Ambulatory Visit (INDEPENDENT_AMBULATORY_CARE_PROVIDER_SITE_OTHER): Payer: Medicaid Other

## 2022-03-01 ENCOUNTER — Ambulatory Visit (INDEPENDENT_AMBULATORY_CARE_PROVIDER_SITE_OTHER): Payer: Medicaid Other | Admitting: Obstetrics & Gynecology

## 2022-03-01 ENCOUNTER — Encounter: Payer: Medicaid Other | Admitting: Advanced Practice Midwife

## 2022-03-01 ENCOUNTER — Telehealth (HOSPITAL_COMMUNITY): Payer: Self-pay | Admitting: *Deleted

## 2022-03-01 VITALS — BP 117/76 | HR 81 | Wt 330.0 lb

## 2022-03-01 DIAGNOSIS — Z3483 Encounter for supervision of other normal pregnancy, third trimester: Secondary | ICD-10-CM

## 2022-03-01 DIAGNOSIS — O99213 Obesity complicating pregnancy, third trimester: Secondary | ICD-10-CM

## 2022-03-01 DIAGNOSIS — Z6841 Body Mass Index (BMI) 40.0 and over, adult: Secondary | ICD-10-CM

## 2022-03-01 DIAGNOSIS — O0993 Supervision of high risk pregnancy, unspecified, third trimester: Secondary | ICD-10-CM

## 2022-03-01 DIAGNOSIS — Z3A39 39 weeks gestation of pregnancy: Secondary | ICD-10-CM | POA: Diagnosis not present

## 2022-03-01 NOTE — Progress Notes (Signed)
HIGH-RISK PREGNANCY VISIT Patient name: Lindsay James MRN [id number]  Date of birth: 16-Jan-1991 Chief Complaint:   High Risk Gestation ([place] today)  History of Present Illness:   Lindsay James is a 31 y.o. G2P1011 female at [redacted]w[redacted]d with an Estimated Date of Delivery: 03/08/22 being seen today for ongoing management of a high-risk pregnancy complicated by:  -morbid obesity -Trich- treated.    Today she reports  pelvic pressure and irregular contractions. .   Contractions: Irritability. Vag. Bleeding: None.  Movement: Present. denies leaking of fluid.      11/30/2021    9:07 AM 09/01/2021   10:03 AM 01/10/2021    8:26 AM 02/15/2020    2:50 PM 01/26/2020    9:40 AM  Depression screen PHQ 2/9  Decreased Interest 1 3 0 0 3  Down, Depressed, Hopeless 1 3 0 0 2  PHQ - 2 Score 2 6 0 0 5  Altered sleeping 3 3  1 3   Tired, decreased energy 3 3  1 1   Change in appetite 1 2  1 3   Feeling bad or failure about yourself  0 0  1 2  Trouble concentrating 1 2  0 0  Moving slowly or fidgety/restless 1 3  1  0  Suicidal thoughts 0 0  0 0  PHQ-9 Score 11 19  5 14      Current Outpatient Medications  Medication Instructions   acetaminophen (TYLENOL) 500 mg, Oral, Every 6 hours PRN   cetirizine (ZYRTEC ALLERGY) 10 mg, Oral, Daily   cyanocobalamin 1,000 mcg, Oral, Daily   docusate sodium (COLACE) 100 mg, Oral, 2 times daily PRN   fluticasone (FLONASE) 50 MCG/ACT nasal spray 2 sprays, Each Nare, Daily   Prenatal Vit-Fe Fumarate-FA (PRENATAL MULTIVITAMIN) TABS tablet 1 tablet, Oral, Daily     Review of Systems:   Pertinent items are noted in HPI Denies abnormal vaginal discharge w/ itching/odor/irritation, headaches, visual changes, shortness of breath, chest pain, abdominal pain, severe nausea/vomiting, or problems with urination or bowel movements unless otherwise stated above. Pertinent History Reviewed:  Reviewed past medical,surgical, social, obstetrical and family history.  Reviewed  problem list, medications and allergies. Physical Assessment:   Vitals:   03/01/22 0900  BP: 117/76  Pulse: 81  Weight: (!) 330 lb (149.7 kg)  Body mass index is 56.64 kg/m.           Physical Examination:   General appearance: alert, well appearing, and in no distress  Mental status: normal mood, behavior, speech, dress, motor activity, and thought processes  Skin: warm & dry   Extremities: Edema: Trace    Cardiovascular: normal heart rate noted  Respiratory: normal respiratory effort, no distress  Abdomen: gravid, soft, non-tender  Pelvic: Cervical exam performed  Dilation: 3.5 Effacement (%): 60 Station: -2  Fetal Status:     Movement: Present Presentation: Vertex  Fetal Surveillance Testing today: cephalic,anterior placenta gr 3,AFI 15 cm,FHR 157 bpm,BPP 8/8    Chaperone:  declined     No results found for this or any previous visit (from the past 24 hour(s)).   Assessment & Plan:  High-risk pregnancy: G3P1011 at [redacted]w[redacted]d with an Estimated Date of Delivery: 03/08/22   1) Morbid obesity BPP 8/8 Scheduled for IOL next week  -reviewed labor room precautions  Meds: No orders of the defined types were placed in this encounter.   Labs/procedures today: BPP  Treatment Plan:  as outlined above  Reviewed: Term labor symptoms and general obstetric precautions including but  not limited to vaginal bleeding, contractions, leaking of fluid and fetal movement were reviewed in detail with the patient.  All questions were answered. Pt has home bp cuff. Check bp weekly, let us know if >140/90.   Follow-up: Return in about 1 week (around 03/08/2022) for as scheduled next week.   Future Appointments  Date Time Provider [place]  03/01/2022  9:30 AM Janyth Pupa, DO [facility]  03/08/2022 10:00 AM [facility] [place] [facility] None  03/08/2022 11:10 AM Florian Buff, MD [facility]  03/10/2022  6:30 AM [facility] SCHED ROOM [facility] None    No orders of the defined  types were placed in this encounter.   Janyth Pupa, DO Attending [place], [facility] for [employer], [place]

## 2022-03-01 NOTE — Progress Notes (Signed)
[place] 39 wks,cephalic,anterior placenta gr 3,AFI 15 cm,FHR 157 bpm,BPP 8/8

## 2022-03-01 NOTE — Telephone Encounter (Signed)
Preadmission screen  

## 2022-03-02 ENCOUNTER — Encounter (HOSPITAL_COMMUNITY): Payer: Self-pay | Admitting: *Deleted

## 2022-03-02 ENCOUNTER — Telehealth (HOSPITAL_COMMUNITY): Payer: Self-pay | Admitting: *Deleted

## 2022-03-02 NOTE — Telephone Encounter (Signed)
Preadmission screen  

## 2022-03-03 ENCOUNTER — Encounter (HOSPITAL_COMMUNITY): Payer: Self-pay | Admitting: Obstetrics & Gynecology

## 2022-03-03 ENCOUNTER — Inpatient Hospital Stay (HOSPITAL_COMMUNITY)
Admission: AD | Admit: 2022-03-03 | Discharge: 2022-03-05 | DRG: 807 | Disposition: A | Discharge status: Home / Self Care | Payer: Medicaid Other | Attending: Family Medicine | Admitting: Family Medicine

## 2022-03-03 ENCOUNTER — Other Ambulatory Visit: Payer: Self-pay

## 2022-03-03 DIAGNOSIS — Z148 Genetic carrier of other disease: Secondary | ICD-10-CM

## 2022-03-03 DIAGNOSIS — O99214 Obesity complicating childbirth: Secondary | ICD-10-CM | POA: Diagnosis present

## 2022-03-03 DIAGNOSIS — Z87891 Personal history of nicotine dependence: Secondary | ICD-10-CM

## 2022-03-03 DIAGNOSIS — Z3483 Encounter for supervision of other normal pregnancy, third trimester: Secondary | ICD-10-CM

## 2022-03-03 DIAGNOSIS — Z3A39 39 weeks gestation of pregnancy: Secondary | ICD-10-CM

## 2022-03-03 DIAGNOSIS — O26893 Other specified pregnancy related conditions, third trimester: Secondary | ICD-10-CM | POA: Diagnosis present

## 2022-03-03 HISTORY — DX: Essential (primary) hypertension: I10

## 2022-03-03 HISTORY — DX: Urinary tract infection, site not specified: N39.0

## 2022-03-03 HISTORY — DX: Anemia, unspecified: D64.9

## 2022-03-03 LAB — CBC
HCT: 35.3 % — ABNORMAL LOW (ref 36.0–46.0)
Hemoglobin: 11.1 g/dL — ABNORMAL LOW (ref 12.0–15.0)
MCH: 22.8 pg — ABNORMAL LOW (ref 26.0–34.0)
MCHC: 31.4 g/dL (ref 30.0–36.0)
MCV: 72.5 fL — ABNORMAL LOW (ref 80.0–100.0)
Platelets: 198 10*3/uL (ref 150–400)
RBC: 4.87 MIL/uL (ref 3.87–5.11)
RDW: 15.3 % (ref 11.5–15.5)
WBC: 15.3 10*3/uL — ABNORMAL HIGH (ref 4.0–10.5)
nRBC: 0 % (ref 0.0–0.2)

## 2022-03-03 LAB — RPR: RPR Ser Ql: NONREACTIVE

## 2022-03-03 LAB — ABO/RH: ABO/RH(D): O POS

## 2022-03-03 MED ORDER — LIDOCAINE HCL (PF) 1 % IJ SOLN
30.0000 mL | INTRAMUSCULAR | Status: AC | PRN
  Administered 2022-03-03: 30 mL via SUBCUTANEOUS
  Filled 2022-03-03: qty 30

## 2022-03-03 MED ORDER — ACETAMINOPHEN 325 MG PO TABS
650.0000 mg | ORAL_TABLET | ORAL | Status: DC | PRN
  Administered 2022-03-03 – 2022-03-04 (×3): 650 mg via ORAL
  Filled 2022-03-03 (×3): qty 2

## 2022-03-03 MED ORDER — BENZOCAINE-MENTHOL 20-0.5 % EX AERO
1.0000 | INHALATION_SPRAY | CUTANEOUS | Status: DC | PRN
  Filled 2022-03-03: qty 56

## 2022-03-03 MED ORDER — ONDANSETRON HCL 4 MG PO TABS: 4.0000 mg | ORAL_TABLET | ORAL | Status: DC | PRN

## 2022-03-03 MED ORDER — DIPHENHYDRAMINE HCL 25 MG PO CAPS: 25.0000 mg | ORAL_CAPSULE | Freq: Four times a day (QID) | ORAL | Status: DC | PRN

## 2022-03-03 MED ORDER — COCONUT OIL OIL: 1.0000 | TOPICAL_OIL | Status: DC | PRN

## 2022-03-03 MED ORDER — ZOLPIDEM TARTRATE 5 MG PO TABS: 5.0000 mg | ORAL_TABLET | Freq: Every evening | ORAL | Status: DC | PRN

## 2022-03-03 MED ORDER — OXYTOCIN-SODIUM CHLORIDE 30-0.9 UT/500ML-% IV SOLN
2.5000 [IU]/h | INTRAVENOUS | Status: DC
  Filled 2022-03-03: qty 500

## 2022-03-03 MED ORDER — SENNOSIDES-DOCUSATE SODIUM 8.6-50 MG PO TABS
2.0000 | ORAL_TABLET | Freq: Every day | ORAL | Status: DC
  Administered 2022-03-04 – 2022-03-05 (×2): 2 via ORAL
  Filled 2022-03-03 (×2): qty 2

## 2022-03-03 MED ORDER — DIBUCAINE (PERIANAL) 1 % EX OINT
1.0000 | TOPICAL_OINTMENT | CUTANEOUS | Status: DC | PRN
  Filled 2022-03-03: qty 28

## 2022-03-03 MED ORDER — SIMETHICONE 80 MG PO CHEW: 80.0000 mg | CHEWABLE_TABLET | ORAL | Status: DC | PRN

## 2022-03-03 MED ORDER — ONDANSETRON HCL 4 MG/2ML IJ SOLN: 4.0000 mg | INTRAMUSCULAR | Status: DC | PRN

## 2022-03-03 MED ORDER — PRENATAL MULTIVITAMIN CH
1.0000 | ORAL_TABLET | Freq: Every day | ORAL | Status: DC
  Administered 2022-03-04 – 2022-03-05 (×2): 1 via ORAL
  Filled 2022-03-03 (×2): qty 1

## 2022-03-03 MED ORDER — TETANUS-DIPHTH-ACELL PERTUSSIS 5-2.5-18.5 LF-MCG/0.5 IM SUSY: 0.5000 mL | PREFILLED_SYRINGE | Freq: Once | INTRAMUSCULAR | Status: DC

## 2022-03-03 MED ORDER — LACTATED RINGERS IV SOLN: INTRAVENOUS | Status: DC

## 2022-03-03 MED ORDER — FENTANYL CITRATE (PF) 100 MCG/2ML IJ SOLN
100.0000 ug | INTRAMUSCULAR | Status: DC | PRN
  Administered 2022-03-03 (×2): 100 ug via INTRAVENOUS
  Filled 2022-03-03 (×2): qty 2

## 2022-03-03 MED ORDER — ONDANSETRON HCL 4 MG/2ML IJ SOLN
4.0000 mg | Freq: Four times a day (QID) | INTRAMUSCULAR | Status: DC
  Administered 2022-03-03: 4 mg via INTRAVENOUS
  Filled 2022-03-03: qty 2

## 2022-03-03 MED ORDER — WITCH HAZEL-GLYCERIN EX PADS: 1.0000 | MEDICATED_PAD | CUTANEOUS | Status: DC | PRN

## 2022-03-03 MED ORDER — OXYTOCIN BOLUS FROM INFUSION
333.0000 mL | Freq: Once | INTRAVENOUS | Status: AC
  Administered 2022-03-03: 333 mL via INTRAVENOUS

## 2022-03-03 MED ORDER — LACTATED RINGERS IV SOLN: 500.0000 mL | INTRAVENOUS | Status: DC | PRN

## 2022-03-03 MED ORDER — IBUPROFEN 600 MG PO TABS
600.0000 mg | ORAL_TABLET | Freq: Four times a day (QID) | ORAL | Status: DC
  Administered 2022-03-03 – 2022-03-05 (×7): 600 mg via ORAL
  Filled 2022-03-03 (×7): qty 1

## 2022-03-03 NOTE — Lactation Note (Signed)
This note was copied from a baby's chart. Lactation Consultation Note  Patient Name: Lindsay James Today's Date: 03/03/2022 Reason for consult: L&D Initial assessment;1st time breastfeeding;Term Age:30 hours P2, term female infant. Birth Parent is experienced with breastfeeding see maternal data below.  Birth Parent latched infant on her right breast using the football hold position, infant latched and was still breastfeeding after 13 minutes when LC left the room. Birth Parent will continue to breastfeed infant according to hunger cues, on demand, 8 to 12+ times within 24 hours, STS. Birth Parent knows to call RN/LC for further latch assistance on MBU if needed.  Maternal Data Has patient been taught Hand Expression?: No Does the patient have breastfeeding experience prior to this delivery?: Yes How long did the patient breastfeed?: Per Birth Parent, she BF her 1st child for 3 months who is currently 98 years old.  Feeding Mother's Current Feeding Choice: Breast Milk and Formula  LATCH Score Latch: Grasps breast easily, tongue down, lips flanged, rhythmical sucking.  Audible Swallowing: Spontaneous and intermittent  Type of Nipple: Everted at rest and after stimulation  Comfort (Breast/Nipple): Soft / non-tender  Hold (Positioning): Assistance needed to correctly position infant at breast and maintain latch.  LATCH Score: 9   Lactation Tools Discussed/Used    Interventions Interventions: Breast feeding basics reviewed;Adjust position;Assisted with latch;Support pillows;Skin to skin;Position options;Breast massage;Expressed milk;Breast compression;Pace feeding;Education;LC Services brochure  Discharge    Consult Status Consult Status: Follow-up from L&D    Frederico Hamman 03/03/2022, 7:00 PM

## 2022-03-03 NOTE — H&P (Signed)
OBSTETRIC ADMISSION HISTORY AND PHYSICAL  Lindsay James is a 31 y.o. female G65P1011 with IUP at [redacted]w[redacted]d by 9wk [place] presenting for SOL. She reports +FMs, No LOF, no VB, no blurry vision, headaches or peripheral edema, and RUQ pain.  She plans on breast feeding. She request nexplanon for birth control. She [facility] care at  [facility]    Dating: By 9wk [place]  --->  Estimated Date of Delivery: 03/08/22  Sono:    @[redacted]w[redacted]d , CWD, normal anatomy, cephalic presentation, anterior placental lie, 2891g, 37% EFW   Prenatal History/Complications:  -alpha thal silent carrier -obesity -Hx of trich, TOC neg -Left groin abscess  Past Medical History: Past Medical History:  Diagnosis Date   Acute appendicitis    Anemia    Chlamydia    Gonorrhea    High cholesterol    Hypertension    Migraine    UTI (urinary tract infection)     Past Surgical History: Past Surgical History:  Procedure Laterality Date   IRRIGATION AND DEBRIDEMENT ABSCESS Left 01/31/2022   Procedure: INCISION AND DRAINAGE OF LEFT THIGH ABSCESS;  Surgeon: 02/02/2022, MD;  Location: [facility] OR;  Service: General;  Laterality: Left;   LAPAROSCOPIC APPENDECTOMY N/A 01/18/2020   Procedure: APPENDECTOMY LAPAROSCOPIC;  Surgeon: 13/03/2019, MD;  Location: [facility] ORS;  Service: General;  Laterality: N/A;    Obstetrical History: OB History     Gravida  3   Para  1   Term  1   Preterm      AB  1   Living  1      SAB  1   IAB      Ectopic      Multiple      Live Births  1           Social History Social History   Socioeconomic History   Marital status: Single    Spouse name: Not on file   Number of children: Not on file   Years of education: Not on file   Highest education level: Not on file  Occupational History   Not on file  Tobacco Use   Smoking status: Former    Packs/day: 0.50    Types: Cigarettes, Cigars   Smokeless tobacco: Never   Tobacco comments:    1 black & mild daily.  stopped  cigarettes  nov 2021  Vaping Use   Vaping Use: Never used  Substance and Sexual Activity   Alcohol use: Not Currently    Comment: socially   Drug use: Not Currently    Types: Marijuana    Comment: April 2023   Sexual activity: Yes    Birth control/protection: None  Other Topics Concern   Not on file  Social History Narrative   Not on file   Social Determinants of Health   Financial Resource Strain: Medium Risk (11/30/2021)   Overall Financial Resource Strain (CARDIA)    Difficulty of Paying Living Expenses: Somewhat hard  Food Insecurity: No Food Insecurity (03/03/2022)   Hunger Vital Sign    Worried About Running Out of Food in the Last Year: Never true    Ran Out of Food in the Last Year: Never true  Transportation Needs: No Transportation Needs (03/03/2022)   PRAPARE - 03/05/2022 (Medical): No    Lack of Transportation (Non-Medical): No  Physical Activity: Insufficiently Active (11/30/2021)   Exercise Vital Sign    Days of Exercise per Week: 2  days    Minutes of Exercise per Session: 30 min  Stress: Stress Concern Present (11/30/2021)   [employer] of Occupational Health - Occupational Stress Questionnaire    Feeling of Stress : To some extent  Social Connections: Moderately Isolated (11/30/2021)   Social Connection and Isolation Panel [NHANES]    Frequency of Communication with Friends and Family: Never    Frequency of Social Gatherings with Friends and Family: Never    Attends Religious Services: 1 to 4 times per year    Active Member of [employer] or Organizations: No    Attends Banker Meetings: Never    Marital Status: Living with partner    Family History: Family History  Problem Relation Age of Onset   Asthma Mother    Arthritis/Rheumatoid Mother    Kidney Stones Mother    Cancer Father    Diabetes Maternal Grandmother     Allergies: Allergies  Allergen Reactions   Peanut-Containing Drug Products Rash     Medications Prior to Admission  Medication Sig Dispense Refill Last Dose   acetaminophen (TYLENOL) 500 MG tablet Take 500 mg by mouth every 6 (six) hours as needed.   Past Month   cetirizine (ZYRTEC ALLERGY) 10 MG tablet Take 1 tablet (10 mg total) by mouth daily. 30 tablet 0 Past Week   fluticasone (FLONASE) 50 MCG/ACT nasal spray Place 2 sprays into both nostrils daily. 16 g 0 03/02/2022   Prenatal Vit-Fe Fumarate-FA (PRENATAL MULTIVITAMIN) TABS tablet Take 1 tablet by mouth daily at 12 noon. 30 tablet 1 03/02/2022   cyanocobalamin 1000 MCG tablet Take 1 tablet (1,000 mcg total) by mouth daily. 30 tablet 0    docusate sodium (COLACE) 100 MG capsule Take 1 capsule (100 mg total) by mouth 2 (two) times daily as needed for mild constipation. 30 capsule 1      Review of Systems   All systems reviewed and negative except as stated in HPI  Blood pressure 134/69, pulse 94, temperature 98.3 F (36.8 C), resp. rate 18, height 5\' 4"  (1.626 m), weight (!) 149.7 kg, SpO2 100 %. General appearance: alert and no distress Lungs: normal effort Heart: regular rate  Abdomen: gravid Pelvic: see below Extremities: Homans sign is negative, no sign of DVT Presentation: cephalic Fetal monitoringBaseline: 125 bpm, Variability: Good {> 6 bpm), Accelerations: Reactive, and Decelerations: Absent Dilation: 6 Effacement (%): 100 Station: -2 Exam by:: jolynn   Prenatal labs: ABO, Rh: --/--/PENDING (12/16 1123) Antibody: NEG (11/12 1707) Rubella: 1.07 (06/14 1639) RPR: Non Reactive (09/14 0830)  HBsAg: Negative (06/14 1639)  HIV: Non Reactive (09/14 0830)  GBS: Negative/-- (11/30 1400)  1 hr Glucola 80 Genetic screening  LR female Anatomy 11-10-1999 wnl  Prenatal Transfer Tool  Maternal Diabetes: No Genetic Screening: Normal Maternal Ultrasounds/Referrals: Normal Fetal Ultrasounds or other Referrals:  None Maternal Substance Abuse:  No Significant Maternal Medications:  None Significant Maternal  Lab Results:  Group B Strep negative Number of Prenatal Visits:greater than 3 verified prenatal visits Other Comments:  None  Results for orders placed or performed during the hospital encounter of 03/03/22 (from the past 24 hour(s))  ABO/Rh   Collection Time: 03/03/22 11:23 AM  Result Value Ref Range   ABO/RH(D) PENDING     Patient Active Problem List   Diagnosis Date Noted   Indication for care in labor or delivery 03/03/2022   B12 deficiency 01/30/2022   BMI 50.0-59.9, adult (HCC) 01/29/2022   Left groin mass 01/28/2022   Abscess 01/28/2022  Lymph node abscess 01/28/2022   Obesity affecting pregnancy 10/20/2021   Alpha thalassemia silent carrier 09/18/2021   Trichomonal vaginitis in pregnancy 09/07/2021   Encounter for supervision of other normal pregnancy, third trimester 08/31/2021   History of prior pregnancy with short cervix, currently pregnant     Assessment/Plan:  Lindsay James is a 31 y.o. G3P1011 at [redacted]w[redacted]d here for SOL.   #Labor: AROM and manage expectantly. Consider need for pitocin. #Pain: Maternally supported #FWB: Cat I  #ID: GBS neg #MOF: Breast #MOC: Nexplanon #Circ: N/a  Duffy Dantonio Autry-Lott, DO  03/03/2022, 11:44 AM

## 2022-03-03 NOTE — Discharge Summary (Signed)
Postpartum Discharge Summary  Date of Service updated***     Patient Name: Lindsay James DOB: Sep 28, 1990 MRN: [id number]  Date of admission: 03/03/2022 Delivery date:03/03/2022  Delivering provider: Gerlene Fee  Date of discharge: 03/03/2022  Admitting diagnosis: Indication for care in labor or delivery [O75.9] Intrauterine pregnancy: [redacted]w[redacted]d    Secondary diagnosis:  Principal Problem:   Indication for care in labor or delivery  Additional problems: ***    Discharge diagnosis: Term Pregnancy Delivered                                              Post partum procedures:{Postpartum procedures:23558} Augmentation: AROM Complications: None  Hospital course: Onset of Labor With Vaginal Delivery      31y.o. yo [id number] 345w2das admitted in Active Labor on 03/03/2022. Labor course was uncomplicated. Membrane Rupture Time/Date: 2:05 PM ,03/03/2022   Delivery Method:Vaginal, Spontaneous  Episiotomy: None  Lacerations:  1st degree;Perineal;Periurethral  Patient had a postpartum course complicated by ***.  She is ambulating, tolerating a regular diet, passing flatus, and urinating well. Patient is discharged home in stable condition on 03/03/22.  Newborn Data: Birth date:03/03/2022  Birth time:5:51 PM  Gender:Female  Living status:Living  Apgars:9 ,9  Weight:3150 g   Magnesium Sulfate received: No BMZ received: No Rhophylac:No MMR:{MMR:30440033} T-DaP:{Tdap:23962} Flu: {F{ZCH:88502}ransfusion:{Transfusion received:30440034}  Physical exam  Vitals:   03/03/22 1241 03/03/22 1516 03/03/22 1800 03/03/22 1830  BP: (!) 116/49 128/74 125/62 (!) 125/57  Pulse: 75 80 (!) 102 93  Resp: 16 20    Temp: 98.4 F (36.9 C)     TempSrc: Oral     SpO2:      Weight:      Height:       General: {Exam; general:21111117} Lochia: {Desc; appropriate/inappropriate:30686::"appropriate"} Uterine Fundus: {Desc; firm/soft:30687} Incision: {Exam; incision:21111123} DVT  Evaluation: {Exam; dvt:2111122} Labs: Lab Results  Component Value Date   WBC 15.3 (H) 03/03/2022   HGB 11.1 (L) 03/03/2022   HCT 35.3 (L) 03/03/2022   MCV 72.5 (L) 03/03/2022   PLT 198 03/03/2022      Latest Ref Rng & Units 01/28/2022    5:07 PM  CMP  Glucose 70 - 99 mg/dL 104   BUN 6 - 20 mg/dL 7   Creatinine 0.44 - 1.00 mg/dL 0.93   Sodium 135 - 145 mmol/L 136   Potassium 3.5 - 5.1 mmol/L 3.4   Chloride 98 - 111 mmol/L 101   CO2 22 - 32 mmol/L 22   Calcium 8.9 - 10.3 mg/dL 9.1   Total Protein 6.5 - 8.1 g/dL 6.1   Total Bilirubin 0.3 - 1.2 mg/dL 0.5   Alkaline Phos 38 - 126 U/L 83   AST 15 - 41 U/L 17   ALT 0 - 44 U/L 12    Edinburgh Score:     No data to display           After visit meds:  Allergies as of 03/03/2022       Reactions   Peanut-containing Drug Products Rash     Med Rec must be completed prior to using this [employer]*        Discharge home in stable condition Infant Feeding: {Baby feeding:23562} Infant Disposition:{CHL IP OB HOME WITH MODXAJOI:78676}ischarge instruction: per After Visit Summary and Postpartum booklet. Activity: Advance as tolerated. Pelvic rest  for 6 weeks.  Diet: {OB RFXJ:88325498} Future Appointments: Future Appointments  Date Time Provider [place]  03/08/2022 11:10 AM Florian Buff, MD [facility]  03/10/2022  6:30 AM [facility] SCHED ROOM [facility] None   Follow up Visit:  Message sent to [facility] by Autry-Lott on 03/03/2022  Please schedule this patient for a Virtual postpartum visit in 6 weeks with the following provider: Any provider. Additional Postpartum F/U: n/a   High risk pregnancy complicated by:  obesity Delivery mode:  Vaginal, Spontaneous  Anticipated Birth Control:  Nexplanon   03/03/2022 Simone Autry-Lott, DO

## 2022-03-03 NOTE — MAU Note (Signed)
.  Lindsay James is a 31 y.o. at [redacted]w[redacted]d here in MAU reporting: thought she might have had some ctx last night but around *;30 this morning they stared to be more frequent and painful. Reports some mucusy discharge but no leaking. Good fetal movement felt.  LMP:  Onset of complaint: 8:30 Pain score: 9 There were no vitals filed for this visit.   FHT:133 Lab orders placed from triage:   Labor eval

## 2022-03-03 NOTE — Progress Notes (Signed)
Notified by Neysa Bonito RN that the patient is feeling the urge to push after walking around. Plan to check and monitor progression of labor. If unchanged, consider starting pitocin.   Lavonda Jumbo, DO OB Fellow, Faculty [facility], Center for [facility] 03/03/2022, 5:13 PM

## 2022-03-04 ENCOUNTER — Encounter (HOSPITAL_COMMUNITY): Payer: Self-pay | Admitting: Family Medicine

## 2022-03-04 LAB — CBC
HCT: 33.3 % — ABNORMAL LOW (ref 36.0–46.0)
Hemoglobin: 10.5 g/dL — ABNORMAL LOW (ref 12.0–15.0)
MCH: 23.1 pg — ABNORMAL LOW (ref 26.0–34.0)
MCHC: 31.5 g/dL (ref 30.0–36.0)
MCV: 73.3 fL — ABNORMAL LOW (ref 80.0–100.0)
Platelets: 262 10*3/uL (ref 150–400)
RBC: 4.54 MIL/uL (ref 3.87–5.11)
RDW: 15.4 % (ref 11.5–15.5)
WBC: 18.4 10*3/uL — ABNORMAL HIGH (ref 4.0–10.5)
nRBC: 0 % (ref 0.0–0.2)

## 2022-03-04 NOTE — Lactation Note (Addendum)
This note was copied from a baby's chart. Lactation Consultation Note  Patient Name: Lindsay James Today's Date: 03/04/2022 Reason for consult: Initial assessment Age:30 hours  P2, Baby sleeping in mother's arms after recent 15 min feeding.  Encouraged mother to offer breast before formula and reminded her about volume guidelines. Feed on demand with cues.  Goal 8-12+ times per day after first 24 hrs.  Place baby STS if not cueing.  Suggest calling for latch assistance as needed. Mom made aware of O/P services, breastfeeding support group and our phone # for post-discharge  Maternal Data Has patient been taught Hand Expression?: Yes Does the patient have breastfeeding experience prior to this delivery?: Yes How long did the patient breastfeed?: 3 mos.  Feeding Mother's Current Feeding Choice: Breast Milk and Formula   Interventions Interventions: Education;LC Services brochure  Consult Status Consult Status: Follow-up Date: 03/05/22 Follow-up type: In-patient    Dahlia Byes [facility] 03/04/2022, 9:08 AM
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
## 2022-03-04 NOTE — Progress Notes (Signed)
Post Partum Day 1 Subjective: no complaints, up ad lib, voiding, and tolerating PO  Objective: Blood pressure (!) 107/44, pulse (!) 108, temperature 97.7 F (36.5 C), temperature source Oral, resp. rate 18, height 5\' 4"  (1.626 m), weight (!) 149.7 kg, SpO2 97 %, unknown if currently breastfeeding.  Physical Exam:  General: alert, cooperative, and no distress Lochia: appropriate Uterine Fundus: firm Incision: n/a DVT Evaluation: No evidence of DVT seen on physical exam. Negative Homan's sign. No cords or calf tenderness.  Recent Labs    03/03/22 1123 03/04/22 0543  HGB 11.1* 10.5*  HCT 35.3* 33.3*    Assessment/Plan: Plan for discharge tomorrow   LOS: 1 day   03/06/22, DO 03/04/2022, 2:41 PM

## 2022-03-05 MED ORDER — SENNOSIDES-DOCUSATE SODIUM 8.6-50 MG PO TABS: 2.0000 | ORAL_TABLET | Freq: Every day | ORAL | Status: DC

## 2022-03-05 MED ORDER — IBUPROFEN 600 MG PO TABS: 600.0000 mg | ORAL_TABLET | Freq: Four times a day (QID) | ORAL | 0 refills | Status: DC

## 2022-03-05 MED ORDER — LORATADINE 10 MG PO TABS
10.0000 mg | ORAL_TABLET | Freq: Every day | ORAL | Status: DC
  Administered 2022-03-05: 10 mg via ORAL
  Filled 2022-03-05: qty 1

## 2022-03-05 MED ORDER — COCONUT OIL OIL: 1.0000 | TOPICAL_OIL | 0 refills | Status: DC | PRN

## 2022-03-05 NOTE — Lactation Note (Signed)
This note was copied from a baby's chart. Lactation Consultation Note  Patient Name: [name] Today's Date: 03/05/2022 Reason for consult: Follow-up assessment Age:30 hours   LC Note:  Family ready for discharge; RN in room to complete discharge instructions.  Mother has no further questions/concerns related to breast feeding.  She has our op phone number for any concerns after discharge.  Support person present.   Maternal Data    Feeding Nipple Type: Extra Slow Flow  LATCH Score                    Lactation Tools Discussed/Used    Interventions    Discharge    Consult Status Consult Status: Complete Date: 03/05/22 Follow-up type: Call as needed    Vung Kush R Denecia Brunette 03/05/2022, 2:17 PM

## 2022-03-07 ENCOUNTER — Ambulatory Visit
Admission: EM | Admit: 2022-03-07 | Discharge: 2022-03-07 | Disposition: A | Discharge status: Home / Self Care | Payer: Medicaid Other | Attending: Family Medicine | Admitting: Family Medicine

## 2022-03-07 DIAGNOSIS — J069 Acute upper respiratory infection, unspecified: Secondary | ICD-10-CM | POA: Insufficient documentation

## 2022-03-07 DIAGNOSIS — Z1152 Encounter for screening for COVID-19: Secondary | ICD-10-CM | POA: Diagnosis not present

## 2022-03-07 MED ORDER — OSELTAMIVIR PHOSPHATE 75 MG PO CAPS: 75.0000 mg | ORAL_CAPSULE | Freq: Two times a day (BID) | ORAL | 0 refills | Status: DC

## 2022-03-07 NOTE — ED Triage Notes (Signed)
Pt reports body aches, coughing, sore throat  x 3 days. Took ibuprofen and nyquil but no relief.

## 2022-03-07 NOTE — ED Provider Notes (Signed)
[facility]    CSN: [id number] Arrival date & time: 03/07/22  1110      History   Chief Complaint No chief complaint on file.   HPI Lindsay James is a 31 y.o. female.   Presenting today with 3-day history of bodyaches, cough, sore throat, fatigue.  Denies chest pain, shortness of breath, abdominal pain, nausea vomiting or diarrhea.  So far trying ibuprofen and NyQuil with minimal relief.  Son sick with similar symptoms.  Of note, patient is newly postpartum and breast-feeding.    Past Medical History:  Diagnosis Date   Acute appendicitis    Anemia    Chlamydia    Gonorrhea    High cholesterol    Hypertension    Migraine    UTI (urinary tract infection)     Patient Active Problem List   Diagnosis Date Noted   Indication for care in labor or delivery 03/03/2022   B12 deficiency 01/30/2022   BMI 50.0-59.9, adult (HCC) 01/29/2022   Left groin mass 01/28/2022   Abscess 01/28/2022   Lymph node abscess 01/28/2022   Obesity affecting pregnancy 10/20/2021   Alpha thalassemia silent carrier 09/18/2021   Trichomonal vaginitis in pregnancy 09/07/2021   Encounter for supervision of other normal pregnancy, third trimester 08/31/2021   History of prior pregnancy with short cervix, currently pregnant     Past Surgical History:  Procedure Laterality Date   IRRIGATION AND DEBRIDEMENT ABSCESS Left 01/31/2022   Procedure: INCISION AND DRAINAGE OF LEFT THIGH ABSCESS;  Surgeon: Diamantina Monks, MD;  Location: [facility] OR;  Service: General;  Laterality: Left;   LAPAROSCOPIC APPENDECTOMY N/A 01/18/2020   Procedure: APPENDECTOMY LAPAROSCOPIC;  Surgeon: Franky Macho, MD;  Location: [facility];  Service: General;  Laterality: N/A;    OB History     Gravida  3   Para  2   Term  2   Preterm      AB  1   Living  2      SAB  1   IAB      Ectopic      Multiple  0   Live Births  2            Home Medications    Prior to Admission medications    Medication Sig Start Date End Date Taking? Authorizing Provider  oseltamivir (TAMIFLU) 75 MG capsule Take 1 capsule (75 mg total) by mouth every 12 (twelve) hours. 03/07/22  Yes Particia Nearing, PA-C  acetaminophen (TYLENOL) 500 MG tablet Take 500 mg by mouth every 6 (six) hours as needed.    [provider]  cetirizine (ZYRTEC ALLERGY) 10 MG tablet Take 1 tablet (10 mg total) by mouth daily. 12/28/21   Leath-Warren, Sadie Haber, NP  coconut oil OIL Apply 1 Application topically as needed. 03/05/22   Billey Co, MD  cyanocobalamin 1000 MCG tablet Take 1 tablet (1,000 mcg total) by mouth daily. 02/01/22   [place] Bing, MD  docusate sodium (COLACE) 100 MG capsule Take 1 capsule (100 mg total) by mouth 2 (two) times daily as needed for mild constipation. 01/31/22   [place] Bing, MD  fluticasone (FLONASE) 50 MCG/ACT nasal spray Place 2 sprays into both nostrils daily. 12/28/21   Leath-Warren, Sadie Haber, NP  ibuprofen (ADVIL) 600 MG tablet Take 1 tablet (600 mg total) by mouth every 6 (six) hours. 03/05/22   Billey Co, MD  Prenatal Vit-Fe Fumarate-FA (PRENATAL MULTIVITAMIN) TABS tablet Take 1 tablet by mouth  daily at 12 noon. 02/01/22   [place] Bing, MD  senna-docusate (SENOKOT-S) 8.6-50 MG tablet Take 2 tablets by mouth daily. 03/05/22   Billey Co, MD  promethazine (PHENERGAN) 25 MG tablet Take 1 tablet (25 mg total) by mouth every 6 (six) hours as needed for nausea or vomiting. 07/22/19 10/28/19  Pollina, Canary Brim, MD    Family History Family History  Problem Relation Age of Onset   Asthma Mother    Arthritis/Rheumatoid Mother    Kidney Stones Mother    Cancer Father    Diabetes Maternal Grandmother     Social History Social History   Tobacco Use   Smoking status: Former    Packs/day: 0.50    Types: Cigarettes, Cigars   Smokeless tobacco: Never   Tobacco comments:    1 black & mild daily.  stopped cigarettes  nov 2021  Vaping Use    Vaping Use: Never used  Substance Use Topics   Alcohol use: Not Currently    Comment: socially   Drug use: Not Currently    Types: Marijuana    Comment: April 2023     Allergies   Peanut-containing drug products   Review of Systems Review of Systems PER HPI  Physical Exam Triage Vital Signs ED Triage Vitals  Enc Vitals Group     BP 03/07/22 1327 116/79     Pulse Rate 03/07/22 1327 81     Resp 03/07/22 1327 18     Temp 03/07/22 1327 98.8 F (37.1 C)     Temp Source 03/07/22 1327 Oral     SpO2 03/07/22 1327 97 %     Weight --      Height --      Head Circumference --      Peak Flow --      Pain Score 03/07/22 1329 0     Pain Loc --      Pain Edu? --      Excl. in GC? --    No data found.  Updated Vital Signs BP 116/79 (BP Location: Right Arm)   Pulse 81   Temp 98.8 F (37.1 C) (Oral)   Resp 18   LMP  (LMP Unknown) Comment: pt just had a baby on 12/16  SpO2 97%   Breastfeeding Yes   Visual Acuity Right Eye Distance:   Left Eye Distance:   Bilateral Distance:    Right Eye Near:   Left Eye Near:    Bilateral Near:     Physical Exam Vitals and nursing note reviewed.  Constitutional:      Appearance: Normal appearance.  HENT:     Head: Atraumatic.     Right Ear: Tympanic membrane and external ear normal.     Left Ear: Tympanic membrane and external ear normal.     Nose: Rhinorrhea present.     Mouth/Throat:     Mouth: Mucous membranes are moist.     Pharynx: Posterior oropharyngeal erythema present.  Eyes:     Extraocular Movements: Extraocular movements intact.     Conjunctiva/sclera: Conjunctivae normal.  Cardiovascular:     Rate and Rhythm: Normal rate and regular rhythm.     Heart sounds: Normal heart sounds.  Pulmonary:     Effort: Pulmonary effort is normal.     Breath sounds: Normal breath sounds. No wheezing or rales.  Musculoskeletal:        General: Normal range of motion.     Cervical back: Normal range of motion and  neck supple.   Skin:    General: Skin is warm and dry.  Neurological:     Mental Status: She is alert and oriented to person, place, and time.  Psychiatric:        Mood and Affect: Mood normal.        Thought Content: Thought content normal.      UC Treatments / Results  Labs (all labs ordered are listed, but only abnormal results are displayed) Labs Reviewed  RESP PANEL BY RT-PCR (FLU A&B, COVID) ARPGX2    EKG   Radiology No results found.  Procedures Procedures (including critical care time)  Medications Ordered in UC Medications - No data to display  Initial Impression / Assessment and Plan / UC Course  I have reviewed the triage vital signs and the nursing notes.  Pertinent labs & imaging results that were available during my care of the patient were reviewed by me and considered in my medical decision making (see chart for details).     Vitals and exam overall reassuring today and suspicious for viral respiratory infection.  Respiratory panel pending, treat with Tamiflu versus influenza and discussed supportive over-the-counter medications and home care.  Return for worsening symptoms.  Final Clinical Impressions(s) / UC Diagnoses   Final diagnoses:  Viral URI with cough   Discharge Instructions   None    ED Prescriptions     Medication Sig Dispense Auth. Provider   oseltamivir (TAMIFLU) 75 MG capsule Take 1 capsule (75 mg total) by mouth every 12 (twelve) hours. 10 capsule Particia Nearing, [place]      PDMP not reviewed this encounter.   Particia Nearing, [place] 03/07/22 1418

## 2022-03-08 ENCOUNTER — Encounter: Payer: Medicaid Other | Admitting: Obstetrics & Gynecology

## 2022-03-08 ENCOUNTER — Other Ambulatory Visit: Payer: Medicaid Other

## 2022-03-08 LAB — RESP PANEL BY RT-PCR (FLU A&B, COVID) ARPGX2
Influenza A by PCR: NEGATIVE
Influenza B by PCR: NEGATIVE
SARS Coronavirus 2 by RT PCR: NEGATIVE

## 2022-03-10 ENCOUNTER — Inpatient Hospital Stay (HOSPITAL_COMMUNITY): Payer: Medicaid Other

## 2022-03-10 ENCOUNTER — Inpatient Hospital Stay (HOSPITAL_COMMUNITY)
Admission: RE | Admit: 2022-03-10 | Payer: Medicaid Other | Source: Home / Self Care | Admitting: Obstetrics & Gynecology

## 2022-03-14 ENCOUNTER — Telehealth (HOSPITAL_COMMUNITY): Payer: Self-pay | Admitting: *Deleted

## 2022-03-14 NOTE — Telephone Encounter (Signed)
Mom reports feeling good. No concerns about herself at this time. EPDS=3 [facility] score=4) Mom reports baby is doing well. Feeding, peeing, and pooping without difficulty. Safe sleep reviewed. Mom reports no concerns about baby at present.  Duffy Rhody, RN 03-14-2022 at 3:37pm

## 2022-03-16 ENCOUNTER — Emergency Department (HOSPITAL_COMMUNITY): Payer: Medicaid Other

## 2022-03-16 ENCOUNTER — Emergency Department (HOSPITAL_COMMUNITY)
Admission: EM | Admit: 2022-03-16 | Discharge: 2022-03-17 | Disposition: A | Discharge status: Home / Self Care | Payer: Medicaid Other | Attending: Emergency Medicine | Admitting: Emergency Medicine

## 2022-03-16 ENCOUNTER — Encounter (HOSPITAL_COMMUNITY): Payer: Self-pay

## 2022-03-16 DIAGNOSIS — R519 Headache, unspecified: Secondary | ICD-10-CM | POA: Diagnosis not present

## 2022-03-16 DIAGNOSIS — M791 Myalgia, unspecified site: Secondary | ICD-10-CM | POA: Diagnosis not present

## 2022-03-16 DIAGNOSIS — R509 Fever, unspecified: Secondary | ICD-10-CM

## 2022-03-16 DIAGNOSIS — R1030 Lower abdominal pain, unspecified: Secondary | ICD-10-CM | POA: Diagnosis present

## 2022-03-16 DIAGNOSIS — N939 Abnormal uterine and vaginal bleeding, unspecified: Secondary | ICD-10-CM | POA: Insufficient documentation

## 2022-03-16 DIAGNOSIS — Z20822 Contact with and (suspected) exposure to covid-19: Secondary | ICD-10-CM | POA: Diagnosis not present

## 2022-03-16 DIAGNOSIS — B9689 Other specified bacterial agents as the cause of diseases classified elsewhere: Secondary | ICD-10-CM | POA: Insufficient documentation

## 2022-03-16 DIAGNOSIS — N390 Urinary tract infection, site not specified: Secondary | ICD-10-CM | POA: Insufficient documentation

## 2022-03-16 LAB — PREGNANCY, URINE: Preg Test, Ur: NEGATIVE

## 2022-03-16 LAB — COMPREHENSIVE METABOLIC PANEL
ALT: 23 U/L (ref 0–44)
AST: 32 U/L (ref 15–41)
Albumin: 3.5 g/dL (ref 3.5–5.0)
Alkaline Phosphatase: 91 U/L (ref 38–126)
Anion gap: 10 (ref 5–15)
BUN: 38 mg/dL — ABNORMAL HIGH (ref 6–20)
CO2: 28 mmol/L (ref 22–32)
Calcium: 8.3 mg/dL — ABNORMAL LOW (ref 8.9–10.3)
Chloride: 103 mmol/L (ref 98–111)
Creatinine, Ser: 1.15 mg/dL — ABNORMAL HIGH (ref 0.44–1.00)
GFR, Estimated: >60 mL/min (ref >60)
Glucose, Bld: 143 mg/dL — ABNORMAL HIGH (ref 70–99)
Potassium: 4 mmol/L (ref 3.5–5.1)
Sodium: 141 mmol/L (ref 135–145)
Total Bilirubin: 0.8 mg/dL (ref 0.3–1.2)
Total Protein: 6.2 g/dL — ABNORMAL LOW (ref 6.5–8.1)

## 2022-03-16 LAB — URINALYSIS, ROUTINE W REFLEX MICROSCOPIC
Bilirubin Urine: NEGATIVE
Glucose, UA: NEGATIVE mg/dL
Ketones, ur: NEGATIVE mg/dL
Nitrite: NEGATIVE
Protein, ur: 100 mg/dL — AB
RBC / HPF: >50 RBC/hpf — ABNORMAL HIGH (ref 0–5)
Specific Gravity, Urine: 1.017 (ref 1.005–1.030)
WBC, UA: >50 WBC/hpf — ABNORMAL HIGH (ref 0–5)
pH: 5 (ref 5.0–8.0)

## 2022-03-16 LAB — CBC WITH DIFFERENTIAL/PLATELET
Abs Immature Granulocytes: 0.02 10*3/uL (ref 0.00–0.07)
Basophils Absolute: 0 10*3/uL (ref 0.0–0.1)
Basophils Relative: 0 %
Eosinophils Absolute: 0 10*3/uL (ref 0.0–0.5)
Eosinophils Relative: 0 %
HCT: 42.5 % (ref 36.0–46.0)
Hemoglobin: 13.1 g/dL (ref 12.0–15.0)
Immature Granulocytes: 0 %
Lymphocytes Relative: 3 %
Lymphs Abs: 0.2 10*3/uL — ABNORMAL LOW (ref 0.7–4.0)
MCH: 32.8 pg (ref 26.0–34.0)
MCHC: 30.8 g/dL (ref 30.0–36.0)
MCV: 106.5 fL — ABNORMAL HIGH (ref 80.0–100.0)
Monocytes Absolute: 0.8 10*3/uL (ref 0.1–1.0)
Monocytes Relative: 11 %
Neutro Abs: 6.2 10*3/uL (ref 1.7–7.7)
Neutrophils Relative %: 86 %
Platelets: 188 10*3/uL (ref 150–400)
RBC: 3.99 MIL/uL (ref 3.87–5.11)
RDW: 15.2 % (ref 11.5–15.5)
WBC: 7.2 10*3/uL (ref 4.0–10.5)
nRBC: 0 % (ref 0.0–0.2)

## 2022-03-16 LAB — PROTIME-INR
INR: 3 — ABNORMAL HIGH (ref 0.8–1.2)
Prothrombin Time: 31.2 seconds — ABNORMAL HIGH (ref 11.4–15.2)

## 2022-03-16 LAB — RESP PANEL BY RT-PCR (RSV, FLU A&B, COVID)  RVPGX2
Influenza A by PCR: NEGATIVE
Influenza B by PCR: NEGATIVE
Resp Syncytial Virus by PCR: NEGATIVE
SARS Coronavirus 2 by RT PCR: NEGATIVE

## 2022-03-16 LAB — LACTIC ACID, PLASMA: Lactic Acid, Venous: 1.5 mmol/L (ref 0.5–1.9)

## 2022-03-16 MED ORDER — ACETAMINOPHEN 325 MG PO TABS
650.0000 mg | ORAL_TABLET | Freq: Once | ORAL | Status: AC
  Administered 2022-03-16: 650 mg via ORAL
  Filled 2022-03-16: qty 2

## 2022-03-16 NOTE — ED Provider Triage Note (Signed)
Emergency Medicine Provider Triage Evaluation Note  Lindsay James , a 31 y.o. female  was evaluated in triage.  Pt complains of headache and chest pain.  Headache and chest pain started about a week ago.  States her son has had the flu at home.  Also endorsing fever and chills.  Mentioned that she was recently pregnant and had a vaginal delivery 2 weeks ago.  2 to 3 months ago she did have an abscess on her inner left leg that required draining and antibiotics.  States she can still feel a mass in the inner leg.  Patient concerned that she may be forming another abscess.  Denies shortness of breath and calf tenderness.  Review of Systems  Positive: See above Negative: See above  Physical Exam  BP 122/74 (BP Location: Right Arm)   Pulse (!) 119   Temp (!) 103 F (39.4 C) (Oral)   Resp (!) 24   Ht 5\' 4"  (1.626 m)   Wt (!) 140.2 kg   LMP 03/03/2022 Comment: pt just had a baby on 12/16  SpO2 99%   Breastfeeding Yes   BMI 53.04 kg/m  Gen:   Awake, ill-appearing Resp:  Normal effort  MSK:   Moves extremities without difficulty  Other:    Medical Decision Making  Medically screening exam initiated at 2:05 PM.  Appropriate orders placed.  ELLIYAH LISZEWSKI was informed that the remainder of the evaluation will be completed by another provider, this initial triage assessment does not replace that evaluation, and the importance of remaining in the ED until their evaluation is complete.  Work up started   [name], PA-C 03/16/22 1411

## 2022-03-16 NOTE — ED Triage Notes (Signed)
Pt reports onset yesterday of headache, chest pain and chills.  Pt temperature 103 oral in triage.

## 2022-03-17 MED ORDER — CEPHALEXIN 500 MG PO CAPS: 500.0000 mg | ORAL_CAPSULE | Freq: Three times a day (TID) | ORAL | 0 refills | Status: DC

## 2022-03-17 MED ORDER — SODIUM CHLORIDE 0.9 % IV BOLUS
1000.0000 mL | Freq: Once | INTRAVENOUS | Status: AC
  Administered 2022-03-17: 1000 mL via INTRAVENOUS

## 2022-03-17 MED ORDER — SODIUM CHLORIDE 0.9 % IV SOLN
1.0000 g | Freq: Once | INTRAVENOUS | Status: AC
  Administered 2022-03-17: 1 g via INTRAVENOUS
  Filled 2022-03-17: qty 10

## 2022-03-17 MED ORDER — ACETAMINOPHEN 500 MG PO TABS
1000.0000 mg | ORAL_TABLET | Freq: Once | ORAL | Status: AC
  Administered 2022-03-17: 1000 mg via ORAL
  Filled 2022-03-17: qty 2

## 2022-03-17 NOTE — Discharge Instructions (Signed)
Begin taking Keflex as prescribed.  Take Tylenol 1000 mg rotated with ibuprofen 600 mg every 4 hours as needed for fever/pain.  Follow-up with your OB/GYN next week, and return to the ER if you develop worsening pain, worsening discharge, or for other new and concerning symptoms.

## 2022-03-17 NOTE — ED Provider Notes (Signed)
[facility] EMERGENCY DEPARTMENT Provider Note   CSN: [id number] Arrival date & time: 03/16/22  1254     History  Chief Complaint  Patient presents with   Fever   Chest Pain   Headache    Lindsay James is a 31 y.o. female.  Patient is a 31 year old female with past medical history of obesity and recent childbirth.  She had a vaginal delivery 2 weeks ago.  Patient presents today with complaints of headache, body aches, and feeling generally unwell.  She also describes lower abdominal discomfort, hematuria, and continued intermittent vaginal bleeding.  She arrives here febrile with a temp of 103.  She denies ill contacts.  The history is provided by the patient.       Home Medications Prior to Admission medications   Medication Sig Start Date End Date Taking? Authorizing Provider  acetaminophen (TYLENOL) 500 MG tablet Take 500 mg by mouth every 6 (six) hours as needed.    [provider]  cetirizine (ZYRTEC ALLERGY) 10 MG tablet Take 1 tablet (10 mg total) by mouth daily. 12/28/21   Leath-Warren, Sadie Haber, NP  coconut oil OIL Apply 1 Application topically as needed. 03/05/22   Billey Co, MD  cyanocobalamin 1000 MCG tablet Take 1 tablet (1,000 mcg total) by mouth daily. 02/01/22   [place] The[place] Bing, MD  docusate sodium (COLACE) 100 MG capsule Take 1 capsule (100 mg total) by mouth 2 (two) times daily as needed for mild constipation. 01/31/22   [place] Bing, MD  fluticasone (FLONASE) 50 MCG/ACT nasal spray Place 2 sprays into both nostrils daily. 12/28/21   Leath-Warren, Sadie Haber, NP  ibuprofen (ADVIL) 600 MG tablet Take 1 tablet (600 mg total) by mouth every 6 (six) hours. 03/05/22   Billey Co, MD  oseltamivir (TAMIFLU) 75 MG capsule Take 1 capsule (75 mg total) by mouth every 12 (twelve) hours. 03/07/22   Particia Nearing, PA-C  Prenatal Vit-Fe Fumarate-FA (PRENATAL MULTIVITAMIN) TABS tablet Take 1 tablet by mouth daily at 12 noon. 02/01/22    [place] Bing, MD  senna-docusate (SENOKOT-S) 8.6-50 MG tablet Take 2 tablets by mouth daily. 03/05/22   Billey Co, MD  promethazine (PHENERGAN) 25 MG tablet Take 1 tablet (25 mg total) by mouth every 6 (six) hours as needed for nausea or vomiting. 07/22/19 10/28/19  Gilda Crease, MD      Allergies    Peanut-containing drug products    Review of Systems   Review of Systems  All other systems reviewed and are negative.   Physical Exam Updated Vital Signs BP (!) 111/55 (BP Location: Right Arm)   Pulse (!) 109   Temp (!) 103.3 F (39.6 C) (Oral)   Resp 16   Ht 5\' 4"  (1.626 m)   Wt (!) 140.2 kg   LMP 03/03/2022 Comment: pt just had a baby on 12/16  SpO2 100%   Breastfeeding Yes   BMI 53.04 kg/m  Physical Exam Vitals and nursing note reviewed.  Constitutional:      General: She is not in acute distress.    Appearance: She is well-developed. She is not diaphoretic.  HENT:     Head: Normocephalic and atraumatic.  Cardiovascular:     Rate and Rhythm: Normal rate and regular rhythm.     Heart sounds: No murmur heard.    No friction rub. No gallop.  Pulmonary:     Effort: Pulmonary effort is normal. No respiratory distress.     Breath sounds: Normal  breath sounds. No wheezing.  Abdominal:     General: Bowel sounds are normal. There is no distension.     Palpations: Abdomen is soft.     Tenderness: There is abdominal tenderness. There is no guarding or rebound.     Comments: There is mild suprapubic tenderness.  Musculoskeletal:        General: Normal range of motion.     Cervical back: Normal range of motion and neck supple.  Skin:    General: Skin is warm and dry.  Neurological:     General: No focal deficit present.     Mental Status: She is alert and oriented to person, place, and time.     ED Results / Procedures / Treatments   Labs (all labs ordered are listed, but only abnormal results are displayed) Labs Reviewed  URINALYSIS, ROUTINE W  REFLEX MICROSCOPIC - Abnormal; Notable for the following components:      Result Value   Color, Urine AMBER (*)    APPearance CLOUDY (*)    Hgb urine dipstick LARGE (*)    Protein, ur 100 (*)    Leukocytes,Ua LARGE (*)    RBC / HPF >50 (*)    WBC, UA >50 (*)    Bacteria, UA FEW (*)    All other components within normal limits  CBC WITH DIFFERENTIAL/PLATELET - Abnormal; Notable for the following components:   MCV 106.5 (*)    Lymphs Abs 0.2 (*)    All other components within normal limits  COMPREHENSIVE METABOLIC PANEL - Abnormal; Notable for the following components:   Glucose, Bld 143 (*)    BUN 38 (*)    Creatinine, Ser 1.15 (*)    Calcium 8.3 (*)    Total Protein 6.2 (*)    All other components within normal limits  PROTIME-INR - Abnormal; Notable for the following components:   Prothrombin Time 31.2 (*)    INR 3.0 (*)    All other components within normal limits  RESP PANEL BY RT-PCR (RSV, FLU A&B, COVID)  RVPGX2  CULTURE, BLOOD (ROUTINE X 2)  CULTURE, BLOOD (ROUTINE X 2)  LACTIC ACID, PLASMA  PREGNANCY, URINE  CBC WITH DIFFERENTIAL/PLATELET    EKG None  Radiology DG Chest 2 View  Result Date: 03/16/2022 CLINICAL DATA:  Chest pain. EXAM: CHEST - 2 VIEW COMPARISON:  Jul 20, 2019. FINDINGS: The heart size and mediastinal contours are within normal limits. Both lungs are clear. The visualized skeletal structures are unremarkable. IMPRESSION: No active cardiopulmonary disease. Electronically Signed   By: Lupita Raider M.D.   On: 03/16/2022 13:45    Procedures Procedures    Medications Ordered in ED Medications  cefTRIAXone (ROCEPHIN) 1 g in sodium chloride 0.9 % 100 mL IVPB (has no administration in time range)  sodium chloride 0.9 % bolus 1,000 mL (has no administration in time range)  acetaminophen (TYLENOL) tablet 650 mg (650 mg Oral Given 03/16/22 1330)    ED Course/ Medical Decision Making/ A&P  Patient is a 31 year old female presenting with fever, lower  abdominal discomfort, dysuria, and feeling generally unwell.  Symptoms began yesterday, 2 weeks after giving birth.  She arrives here febrile with temp of 103, but otherwise stable vital signs.  On exam, patient is well-appearing and in no distress.  She has mild suprapubic tenderness, but no other abnormal findings.  Workup initiated including CBC, metabolic panel, urinalysis, and respiratory panel.  Patient with no leukocytosis, basically unremarkable electrolytes, and normal lactate.  Blood  cultures obtained and are pending.  Respiratory panel negative for COVID, influenza, and RSV.  Urinalysis consistent with UTI.  At this point, symptoms most likely related to urinary tract infection.  Patient given IV Rocephin along with a liter of saline.  I did discuss care with Dr. Debroah Loop from OB/GYN.  He agrees with me that retained products of conception/endometritis last likely given the lack of discharge and clinical presentation.  Patient to be discharged with Keflex and follow-up with primary doctor.  Final Clinical Impression(s) / ED Diagnoses Final diagnoses:  None    Rx / DC Orders ED Discharge Orders     None         Geoffery Lyons, MD 03/17/22 [phone]

## 2022-03-21 LAB — CULTURE, BLOOD (ROUTINE X 2)
Culture: NO GROWTH
Culture: NO GROWTH
Special Requests: ADEQUATE
Special Requests: ADEQUATE

## 2022-03-26 ENCOUNTER — Ambulatory Visit (INDEPENDENT_AMBULATORY_CARE_PROVIDER_SITE_OTHER): Payer: Medicaid Other | Admitting: Women's Health

## 2022-03-26 ENCOUNTER — Other Ambulatory Visit (HOSPITAL_COMMUNITY)
Admission: RE | Admit: 2022-03-26 | Discharge: 2022-03-26 | Disposition: A | Discharge status: Home / Self Care | Payer: Medicaid Other | Source: Ambulatory Visit | Attending: Women's Health | Admitting: Women's Health

## 2022-03-26 ENCOUNTER — Encounter: Payer: Self-pay | Admitting: Women's Health

## 2022-03-26 VITALS — BP 118/79 | HR 57 | Ht 64.0 in | Wt 311.0 lb

## 2022-03-26 DIAGNOSIS — N9089 Other specified noninflammatory disorders of vulva and perineum: Secondary | ICD-10-CM

## 2022-03-26 DIAGNOSIS — R300 Dysuria: Secondary | ICD-10-CM | POA: Insufficient documentation

## 2022-03-26 DIAGNOSIS — L292 Pruritus vulvae: Secondary | ICD-10-CM | POA: Insufficient documentation

## 2022-03-26 LAB — POCT URINALYSIS DIPSTICK
Glucose, UA: NEGATIVE
Ketones, UA: NEGATIVE
Nitrite, UA: NEGATIVE
Protein, UA: NEGATIVE

## 2022-03-26 NOTE — Progress Notes (Signed)
GYN VISIT Patient name: Lindsay James MRN [id number]  Date of birth: June 09, 1990 Chief Complaint:   Follow-up (On UTI; still having burning with urination; pain in vaginal area)  History of Present Illness:   Lindsay James is a 32 y.o. [phone] African-American female 3wks s/p SVB being seen today for f/u after ED visit 03/16/22, treated for UTI, given IV Rocephin and rx for keflex which she finished Friday. Still has burning w/ urination. Did have periurethral tears, but states it does not burn when urine hits skin. No frequency.  Also some vaginal itching/irritation. No sex since birth of baby, plans Nexplanon. Breast & bottlefeeding. Denies ppd/anxiety.   Patient's last menstrual period was 03/03/2022. The current method of family planning is abstinence.      11/30/2021    9:07 AM 09/01/2021   10:03 AM 01/10/2021    8:26 AM 02/15/2020    2:50 PM 01/26/2020    9:40 AM  Depression screen PHQ 2/9  Decreased Interest 1 3 0 0 3  Down, Depressed, Hopeless 1 3 0 0 2  PHQ - 2 Score 2 6 0 0 5  Altered sleeping 3 3  1 3   Tired, decreased energy 3 3  1 1   Change in appetite 1 2  1 3   Feeling bad or failure about yourself  0 0  1 2  Trouble concentrating 1 2  0 0  Moving slowly or fidgety/restless 1 3  1  0  Suicidal thoughts 0 0  0 0  PHQ-9 Score 11 19  5 14         11/30/2021    9:08 AM 09/01/2021   10:03 AM 02/15/2020    2:51 PM 01/26/2020    9:43 AM  GAD 7 : Generalized Anxiety Score  Nervous, Anxious, on Edge 1 3 1 1   Control/stop worrying 1 3 1 1   Worry too much - different things 2 3 1 1   Trouble relaxing 2 3 1 1   Restless 0 3 0 1  Easily annoyed or irritable 1 3 0 0  Afraid - awful might happen 1 3 0 1  Total GAD 7 Score 8 21 4 6      Review of Systems:   Pertinent items are noted in HPI Denies fever/chills, dizziness, headaches, visual disturbances, fatigue, shortness of breath, chest pain, abdominal pain, vomiting, abnormal vaginal  discharge/itching/odor/irritation, problems with periods, bowel movements, urination, or intercourse unless otherwise stated above.  Pertinent History Reviewed:  Reviewed past medical,surgical, social, obstetrical and family history.  Reviewed problem list, medications and allergies. Physical Assessment:   Vitals:   03/26/22 0942  BP: 118/79  Pulse: (!) 57  Weight: (!) 311 lb (141.1 kg)  Height: 5\' 4"  (1.626 m)  Body mass index is 53.38 kg/m.       Physical Examination:   General appearance: alert, well appearing, and in no distress  Mental status: alert, oriented to person, place, and time  Skin: warm & dry   Cardiovascular: normal heart rate noted  Respiratory: normal respiratory effort, no distress  Abdomen: soft, non-tender   Pelvic: lacs healing well, blind CV swab obtained  Extremities: no edema   Chaperone: Alice Rieger    Results for orders placed or performed in visit on 03/26/22 (from the past 24 hour(s))  POCT Urinalysis Dipstick   Collection Time: 03/26/22  9:50 AM  Result Value Ref Range   Color, UA     Clarity, UA     Glucose, UA Negative Negative  Bilirubin, UA     Ketones, UA neg    Spec Grav, UA     Blood, UA moderate    pH, UA     Protein, UA Negative Negative   Urobilinogen, UA     Nitrite, UA neg    Leukocytes, UA Trace (A) Negative   Appearance     Odor      Assessment & Plan:  1) 3wks s/p SVB> breast & bottlefeeding  2) Dysuria> s/p IV Rocephin and rx keflex. Only tr leuks, mod blood (still bleeding some from birth) on today's dip. Will send urine cx (was not sent from ED).  3) Vulvar itching/irritation> CV swab sent  4) Plans Nexplanon at pp visit> switch visit to in person, abstinence until after insertion  Meds: No orders of the defined types were placed in this encounter.   Orders Placed This Encounter  Procedures   Urine Culture   POCT Urinalysis Dipstick    Return for change pp visit to in person, add nexplanon  insertion.  Cheral Marker CNM, [facility] 03/26/2022 10:17 AM

## 2022-03-27 LAB — CERVICOVAGINAL ANCILLARY ONLY
Bacterial Vaginitis (gardnerella): POSITIVE — AB
Candida Glabrata: NEGATIVE
Candida Vaginitis: NEGATIVE
Chlamydia: NEGATIVE
Comment: NEGATIVE
Comment: NEGATIVE
Comment: NEGATIVE
Comment: NEGATIVE
Comment: NEGATIVE
Comment: NORMAL
Neisseria Gonorrhea: NEGATIVE
Trichomonas: NEGATIVE

## 2022-03-27 MED ORDER — METRONIDAZOLE 500 MG PO TABS: 500.0000 mg | ORAL_TABLET | Freq: Two times a day (BID) | ORAL | 0 refills | Status: DC

## 2022-03-27 NOTE — Addendum Note (Signed)
Addended by: Roma Schanz on: 03/27/2022 02:58 PM   Modules accepted: Orders

## 2022-03-29 ENCOUNTER — Telehealth: Payer: Self-pay | Admitting: *Deleted

## 2022-03-29 ENCOUNTER — Other Ambulatory Visit: Payer: Self-pay | Admitting: Adult Health

## 2022-03-29 LAB — URINE CULTURE

## 2022-03-29 MED ORDER — AMOXICILLIN-POT CLAVULANATE 875-125 MG PO TABS: 1.0000 | ORAL_TABLET | Freq: Two times a day (BID) | ORAL | 0 refills | Status: DC

## 2022-03-29 NOTE — Progress Notes (Unsigned)
Urine culture + acinetobacter baumannii, will augmentin

## 2022-03-29 NOTE — Telephone Encounter (Signed)
Patient made aware urine culture positive. Antibiotics sent in to pharmacy.

## 2022-04-04 ENCOUNTER — Encounter: Payer: Self-pay | Admitting: Family Medicine

## 2022-04-04 ENCOUNTER — Ambulatory Visit (INDEPENDENT_AMBULATORY_CARE_PROVIDER_SITE_OTHER): Payer: Medicaid Other | Admitting: Family Medicine

## 2022-04-04 VITALS — BP 103/57 | HR 73 | Wt 298.8 lb

## 2022-04-04 DIAGNOSIS — Z3202 Encounter for pregnancy test, result negative: Secondary | ICD-10-CM | POA: Diagnosis not present

## 2022-04-04 DIAGNOSIS — Z30017 Encounter for initial prescription of implantable subdermal contraceptive: Secondary | ICD-10-CM | POA: Diagnosis not present

## 2022-04-04 LAB — POCT URINE PREGNANCY: Preg Test, Ur: NEGATIVE

## 2022-04-04 MED ORDER — ETONOGESTREL 68 MG ~~LOC~~ IMPL
68.0000 mg | DRUG_IMPLANT | Freq: Once | SUBCUTANEOUS | Status: AC
  Administered 2022-04-04: 68 mg via SUBCUTANEOUS

## 2022-04-04 NOTE — Progress Notes (Signed)
POSTPARTUM VISIT Patient name: Lindsay James MRN [id number]  Date of birth: 03/13/1991 Chief Complaint:   Postpartum Care (Hands peeling) and nexplanon insertion  History of Present Illness:   Lindsay James is a 32 y.o. G21P2012 African American female being seen today for a postpartum visit. She is 4 weeks postpartum following a spontaneous vaginal delivery at 39 gestational weeks. IOL: no, for n/a. Anesthesia: local.  Laceration: 2nd degree and periurethral.  Complications: none. Inpatient contraception: no.   Pregnancy uncomplicated. Tobacco use: no. Substance use disorder: no. Last pap smear: 09/01/21 and results were NILM w/ HRHPV negative. Next pap smear due: 09/01/24 Patient's last menstrual period was 03/03/2022.  Postpartum course has been complicated by BV and UTI . Bleeding scant staining. Bowel function is normal. Bladder function is normal. Urinary incontinence? no, fecal incontinence? no Patient is not sexually active. Last sexual activity: prior to birth of baby. Desired contraception: Nexplanon. Patient does not know want a pregnancy in the future.  Desired family size is 3 children.   Upstream - 04/04/22 9528       Pregnancy Intention Screening   Does the patient want to become pregnant in the next year? No    Does the patient's partner want to become pregnant in the next year? No    Would the patient like to discuss contraceptive options today? Yes      Contraception Wrap Up   Current Method Abstinence    End Method Hormonal Implant    Contraception Counseling Provided Yes            The pregnancy intention screening data noted above was reviewed. Potential methods of contraception were discussed. The patient elected to proceed with Hormonal Implant.  Edinburgh Postpartum Depression Screening: negative  Edinburgh Postnatal Depression Scale - 04/04/22 0937       Edinburgh Postnatal Depression Scale:  In the Past 7 Days   I have been able to laugh and see  the funny side of things. 0    I have looked forward with enjoyment to things. 0    I have blamed myself unnecessarily when things went wrong. 0    I have been anxious or worried for no good reason. 0    I have felt scared or panicky for no good reason. 0    Things have been getting on top of me. 0    I have been so unhappy that I have had difficulty sleeping. 0    I have felt sad or miserable. 0    I have been so unhappy that I have been crying. 0    The thought of harming myself has occurred to me. 0    Edinburgh Postnatal Depression Scale Total 0                11/30/2021    9:08 AM 09/01/2021   10:03 AM 02/15/2020    2:51 PM 01/26/2020    9:43 AM  GAD 7 : Generalized Anxiety Score  Nervous, Anxious, on Edge 1 3 1 1   Control/stop worrying 1 3 1 1   Worry too much - different things 2 3 1 1   Trouble relaxing 2 3 1 1   Restless 0 3 0 1  Easily annoyed or irritable 1 3 0 0  Afraid - awful might happen 1 3 0 1  Total GAD 7 Score 8 21 4 6      Baby's course has been uncomplicated. Baby is feeding by breast and bottle: milk supply  adequate. Infant has a pediatrician/family doctor? Yes.  Childcare strategy if returning to work/school: family.  Pt has material needs met for her and baby: Yes.   Review of Systems:   Pertinent items are noted in HPI Denies Abnormal vaginal discharge w/ itching/odor/irritation, headaches, visual changes, shortness of breath, chest pain, abdominal pain, severe nausea/vomiting, or problems with urination or bowel movements. Pertinent History Reviewed:  Reviewed past medical,surgical, obstetrical and family history.  Reviewed problem list, medications and allergies. OB History  Gravida Para Term Preterm AB Living  3 2 2   1 2   SAB IAB Ectopic Multiple Live Births  1     0 2    # Outcome Date GA Lbr Len/2nd Weight Sex Delivery Anes PTL Lv  3 Term 03/03/22 [redacted]w[redacted]d 09:12 / 00:09 6 lb 15.1 oz (3.15 kg) F Vag-Spont Local  LIV  2 SAB 2020          1 Term  07/06/14 [redacted]w[redacted]d  7 lb 9 oz (3.43 kg) M Vag-Spont EPI N    Physical Assessment:   Vitals:   04/04/22 0935  BP: (!) 103/57  Pulse: 73  Weight: 298 lb 12.8 oz (135.5 kg)  Body mass index is 51.29 kg/m.       Physical Examination:   General appearance: alert, well appearing, and in no distress  Mental status: alert, oriented to person, place, and time  Skin: warm & dry   Cardiovascular: normal heart rate noted   Respiratory: normal respiratory effort, no distress   Breasts: deferred, no complaints   Abdomen: soft, non-tender   Pelvic: exam declined by the patient. Thin prep pap obtained: No  Rectal: not examined  Extremities: Edema: none   Chaperone: N/A    Nexplanon Insertion Procedure Patient identified, informed consent performed, consent signed.   Patient does understand that irregular bleeding is a very common side effect of this medication. She was advised to have backup contraception for one week after placement. Pregnancy test in clinic today was negative.  Appropriate time out taken.  Patient's left arm was prepped and draped in the usual sterile fashion. The ruler used to measure and mark insertion area.  Patient was prepped with alcohol swab and then injected with 3 ml of 1% lidocaine.  She was prepped with betadine, Nexplanon removed from packaging,  Device confirmed in needle, then inserted full length of needle and withdrawn per handbook instructions. Nexplanon was able to palpated in the patient's arm; patient palpated the insert herself. There was minimal blood loss.  Patient insertion site covered with guaze and a pressure bandage to reduce any bruising.  The patient tolerated the procedure well and was given post procedure instructions.        LOT [id number] Exp Jan 12 2024  No results found for this or any previous visit (from the past 24 hour(s)).  Assessment & Plan:  1) Postpartum exam 2) 4 wks s/p spontaneous vaginal delivery 3) breast & bottle feeding 4) Depression  screening 5) Contraception management: Nexplanon   Essential components of care per ACOG recommendations:  1.  Mood and well being:  If positive depression screen, discussed and plan developed.  If using tobacco we discussed reduction/cessation and risk of relapse If current substance abuse, we discussed and referral to local resources was offered.   2. Infant care and feeding:  If breastfeeding, discussed returning to work, pumping, breastfeeding-associated pain, guidance regarding return to fertility while lactating if not using another method. If needed, patient was provided  with a letter to be allowed to pump q 2-3hrs to support lactation in a private location with access to a refrigerator to store breastmilk.   Recommended that all caregivers be immunized for flu, pertussis and other preventable communicable diseases If pt does not have material needs met for her/baby, referred to local resources for help obtaining these.  3. Sexuality, contraception and birth spacing Provided guidance regarding sexuality, management of dyspareunia, and resumption of intercourse Discussed avoiding interpregnancy interval <31mths and recommended birth spacing of 18 months  4. Sleep and fatigue Discussed coping options for fatigue and sleep disruption Encouraged family/partner/community support of 4 hrs of uninterrupted sleep to help with mood and fatigue  5. Physical recovery  If pt had a C/S, assessed incisional pain and providing guidance on normal vs prolonged recovery If pt had a laceration, perineal healing and pain reviewed.  If urinary or fecal incontinence, discussed management and referred to PT or uro/gyn if indicated  Patient is safe to resume physical activity. Discussed attainment of healthy weight.  6.  Chronic disease management Discussed pregnancy complications if any, and their implications for future childbearing and long-term maternal health. Review recommendations for prevention  of recurrent pregnancy complications, such as 17 hydroxyprogesterone caproate to reduce risk for recurrent PTB not applicable, or aspirin to reduce risk of preeclampsia not applicable. Pt had GDM: no. If yes, 2hr GTT scheduled: not applicable. Reviewed medications and non-pregnant dosing including consideration of whether pt is breastfeeding using a reliable resource such as LactMed: not applicable Referred for f/u w/ PCP or subspecialist providers as indicated: not applicable  7. Health maintenance Mammogram at 31yo or earlier if indicated Pap smears as indicated  Meds: No orders of the defined types were placed in this encounter.   Follow-up: No follow-ups on file.   No orders of the defined types were placed in this encounter.   Concepcion Living CNM, [facility] 04/04/2022 9:43 AM

## 2022-04-18 ENCOUNTER — Telehealth: Payer: Medicaid Other | Admitting: Advanced Practice Midwife

## 2022-05-22 ENCOUNTER — Other Ambulatory Visit: Payer: Self-pay

## 2022-05-22 ENCOUNTER — Emergency Department (HOSPITAL_COMMUNITY)
Admission: EM | Admit: 2022-05-22 | Discharge: 2022-05-22 | Disposition: A | Discharge status: Home / Self Care | Payer: BC Managed Care – PPO | Attending: Student | Admitting: Student

## 2022-05-22 ENCOUNTER — Emergency Department (HOSPITAL_COMMUNITY): Payer: BC Managed Care – PPO

## 2022-05-22 ENCOUNTER — Encounter (HOSPITAL_COMMUNITY): Payer: Self-pay | Admitting: Emergency Medicine

## 2022-05-22 DIAGNOSIS — M1711 Unilateral primary osteoarthritis, right knee: Secondary | ICD-10-CM | POA: Diagnosis not present

## 2022-05-22 DIAGNOSIS — Z79899 Other long term (current) drug therapy: Secondary | ICD-10-CM | POA: Insufficient documentation

## 2022-05-22 DIAGNOSIS — M25561 Pain in right knee: Secondary | ICD-10-CM | POA: Diagnosis not present

## 2022-05-22 DIAGNOSIS — M25562 Pain in left knee: Secondary | ICD-10-CM | POA: Diagnosis not present

## 2022-05-22 DIAGNOSIS — Y92410 Unspecified street and highway as the place of occurrence of the external cause: Secondary | ICD-10-CM | POA: Diagnosis not present

## 2022-05-22 DIAGNOSIS — I10 Essential (primary) hypertension: Secondary | ICD-10-CM | POA: Diagnosis not present

## 2022-05-22 DIAGNOSIS — Z91010 Allergy to peanuts: Secondary | ICD-10-CM | POA: Insufficient documentation

## 2022-05-22 DIAGNOSIS — R0789 Other chest pain: Secondary | ICD-10-CM | POA: Insufficient documentation

## 2022-05-22 DIAGNOSIS — M25531 Pain in right wrist: Secondary | ICD-10-CM | POA: Diagnosis not present

## 2022-05-22 DIAGNOSIS — M1712 Unilateral primary osteoarthritis, left knee: Secondary | ICD-10-CM | POA: Diagnosis not present

## 2022-05-22 DIAGNOSIS — Z041 Encounter for examination and observation following transport accident: Secondary | ICD-10-CM | POA: Diagnosis not present

## 2022-05-22 DIAGNOSIS — Z87891 Personal history of nicotine dependence: Secondary | ICD-10-CM | POA: Insufficient documentation

## 2022-05-22 MED ORDER — NAPROXEN 375 MG PO TABS: 375.0000 mg | ORAL_TABLET | Freq: Two times a day (BID) | ORAL | 0 refills | Status: DC

## 2022-05-22 MED ORDER — NAPROXEN 250 MG PO TABS
500.0000 mg | ORAL_TABLET | Freq: Once | ORAL | Status: AC
  Administered 2022-05-22: 500 mg via ORAL
  Filled 2022-05-22: qty 2
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
## 2022-05-29 ENCOUNTER — Encounter: Payer: Self-pay | Admitting: Emergency Medicine

## 2022-05-29 ENCOUNTER — Ambulatory Visit
Admission: EM | Admit: 2022-05-29 | Discharge: 2022-05-29 | Disposition: A | Discharge status: Home / Self Care | Payer: BC Managed Care – PPO

## 2022-05-29 ENCOUNTER — Other Ambulatory Visit: Payer: Self-pay

## 2022-05-29 DIAGNOSIS — S8011XA Contusion of right lower leg, initial encounter: Secondary | ICD-10-CM

## 2022-05-29 DIAGNOSIS — S8012XA Contusion of left lower leg, initial encounter: Secondary | ICD-10-CM | POA: Diagnosis not present
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
## 2022-05-29 NOTE — Discharge Instructions (Signed)
Please wear the Ace wraps on both of your lower extremities during the day while you are awake.  This should help with the pain and swelling from the bruising.  Every hour, sit with your legs elevated for 15 minutes and apply an ice pack.  You can take Tylenol as needed for pain.
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
## 2022-06-28 ENCOUNTER — Ambulatory Visit (INDEPENDENT_AMBULATORY_CARE_PROVIDER_SITE_OTHER): Payer: BC Managed Care – PPO | Admitting: Family Medicine

## 2022-06-28 ENCOUNTER — Encounter: Payer: Self-pay | Admitting: Family Medicine

## 2022-06-28 VITALS — BP 118/84 | HR 72 | Temp 97.9°F | Ht 64.0 in | Wt 312.6 lb

## 2022-06-28 DIAGNOSIS — E66813 Body mass index (BMI) 50.0-59.9, adult: Secondary | ICD-10-CM | POA: Insufficient documentation

## 2022-06-28 DIAGNOSIS — Z6841 Body Mass Index (BMI) 40.0 and over, adult: Secondary | ICD-10-CM

## 2022-06-28 DIAGNOSIS — M25561 Pain in right knee: Secondary | ICD-10-CM | POA: Insufficient documentation

## 2022-06-28 DIAGNOSIS — G43709 Chronic migraine without aura, not intractable, without status migrainosus: Secondary | ICD-10-CM | POA: Insufficient documentation

## 2022-06-28 DIAGNOSIS — Z0001 Encounter for general adult medical examination with abnormal findings: Secondary | ICD-10-CM

## 2022-06-28 DIAGNOSIS — M25562 Pain in left knee: Secondary | ICD-10-CM

## 2022-06-28 DIAGNOSIS — Z0000 Encounter for general adult medical examination without abnormal findings: Secondary | ICD-10-CM | POA: Insufficient documentation
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
## 2022-06-28 NOTE — Assessment & Plan Note (Signed)

## 2022-06-28 NOTE — Assessment & Plan Note (Signed)
Chronic. Negative SNOOPP10. Has tried NSAIDs. Will refer to neurology for further evaluation and treatment options given chronicity.

## 2022-06-28 NOTE — Assessment & Plan Note (Signed)
Counseled on importance of maintaining a healthy weight for overall health. She is 3 months postpartum. Is interested in working on weight loss. I encouraged a low calorie, heart healthy diet and 150 minutes of moderate intensity exercise weekly. Will refer to MWM and follow up in office in 3 months. Fasting labs done today.
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
## 2022-06-29 LAB — CBC WITH DIFFERENTIAL/PLATELET
Absolute Monocytes: 673 cells/uL (ref 200–950)
Basophils Absolute: 20 cells/uL (ref 0–200)
Basophils Relative: 0.2 %
Eosinophils Absolute: 357 cells/uL (ref 15–500)
Eosinophils Relative: 3.5 %
HCT: 41.1 % (ref 35.0–45.0)
Hemoglobin: 12.4 g/dL (ref 11.7–15.5)
Lymphs Abs: 2264 cells/uL (ref 850–3900)
MCH: 22.5 pg — ABNORMAL LOW (ref 27.0–33.0)
MCHC: 30.2 g/dL — ABNORMAL LOW (ref 32.0–36.0)
MCV: 74.7 fL — ABNORMAL LOW (ref 80.0–100.0)
MPV: 10.9 fL (ref 7.5–12.5)
Monocytes Relative: 6.6 %
Neutro Abs: 6885 cells/uL (ref 1500–7800)
Neutrophils Relative %: 67.5 %
Platelets: 300 10*3/uL (ref 140–400)
RBC: 5.5 10*6/uL — ABNORMAL HIGH (ref 3.80–5.10)
RDW: 15.5 % — ABNORMAL HIGH (ref 11.0–15.0)
Total Lymphocyte: 22.2 %
WBC: 10.2 10*3/uL (ref 3.8–10.8)

## 2022-06-29 LAB — LIPID PANEL
Cholesterol: 168 mg/dL (ref <200)
HDL: 40 mg/dL — ABNORMAL LOW (ref >=50)
LDL Cholesterol (Calc): 112 mg/dL (calc) — ABNORMAL HIGH
Non-HDL Cholesterol (Calc): 128 mg/dL (calc) (ref <130)
Total CHOL/HDL Ratio: 4.2 (calc) (ref <5.0)
Triglycerides: 69 mg/dL (ref <150)

## 2022-06-29 LAB — COMPLETE METABOLIC PANEL WITH GFR
AG Ratio: 1.3 (calc) (ref 1.0–2.5)
ALT: 21 U/L (ref 6–29)
AST: 17 U/L (ref 10–30)
Albumin: 3.9 g/dL (ref 3.6–5.1)
Alkaline phosphatase (APISO): 94 U/L (ref 31–125)
BUN/Creatinine Ratio: 13 (calc) (ref 6–22)
BUN: 14 mg/dL (ref 7–25)
CO2: 25 mmol/L (ref 20–32)
Calcium: 9.3 mg/dL (ref 8.6–10.2)
Chloride: 104 mmol/L (ref 98–110)
Creat: 1.07 mg/dL — ABNORMAL HIGH (ref 0.50–0.97)
Globulin: 3 g/dL (calc) (ref 1.9–3.7)
Glucose, Bld: 74 mg/dL (ref 65–99)
Potassium: 4.5 mmol/L (ref 3.5–5.3)
Sodium: 138 mmol/L (ref 135–146)
Total Bilirubin: 0.5 mg/dL (ref 0.2–1.2)
Total Protein: 6.9 g/dL (ref 6.1–8.1)
eGFR: 72 mL/min/{1.73_m2} (ref >=60)

## 2022-06-29 LAB — TSH: TSH: 1.19 mIU/L

## 2022-06-29 LAB — HEMOGLOBIN A1C
Hgb A1c MFr Bld: 5.4 % of total Hgb (ref <5.7)
Mean Plasma Glucose: 108 mg/dL
eAG (mmol/L): 6 mmol/L

## 2022-07-02 ENCOUNTER — Other Ambulatory Visit: Payer: Self-pay | Admitting: Family Medicine

## 2022-07-05 NOTE — Addendum Note (Signed)
Addended by: Park Meo on: 07/05/2022 01:20 PM   Modules accepted: Level of Service

## 2022-07-10 ENCOUNTER — Ambulatory Visit: Payer: BC Managed Care – PPO | Admitting: Family Medicine

## 2022-07-23 ENCOUNTER — Ambulatory Visit (INDEPENDENT_AMBULATORY_CARE_PROVIDER_SITE_OTHER): Payer: BC Managed Care – PPO | Admitting: Family Medicine

## 2022-07-23 ENCOUNTER — Encounter: Payer: Self-pay | Admitting: Family Medicine

## 2022-07-23 VITALS — BP 100/78 | HR 63 | Temp 98.7°F | Ht 64.0 in | Wt 316.0 lb

## 2022-07-23 DIAGNOSIS — M25562 Pain in left knee: Secondary | ICD-10-CM

## 2022-07-23 MED ORDER — MELOXICAM 7.5 MG PO TABS: 7.5000 mg | ORAL_TABLET | Freq: Every day | ORAL | 0 refills | Status: DC
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
## 2022-08-16 ENCOUNTER — Other Ambulatory Visit: Payer: Self-pay

## 2022-08-16 ENCOUNTER — Ambulatory Visit (HOSPITAL_COMMUNITY): Payer: BC Managed Care – PPO | Attending: Family Medicine | Admitting: Physical Therapy

## 2022-08-16 ENCOUNTER — Encounter (HOSPITAL_COMMUNITY): Payer: Self-pay | Admitting: Physical Therapy

## 2022-08-16 DIAGNOSIS — M25562 Pain in left knee: Secondary | ICD-10-CM | POA: Insufficient documentation

## 2022-08-16 DIAGNOSIS — R29898 Other symptoms and signs involving the musculoskeletal system: Secondary | ICD-10-CM | POA: Insufficient documentation

## 2022-08-16 DIAGNOSIS — M6281 Muscle weakness (generalized): Secondary | ICD-10-CM | POA: Diagnosis not present

## 2022-08-16 DIAGNOSIS — R2689 Other abnormalities of gait and mobility: Secondary | ICD-10-CM | POA: Insufficient documentation
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
## 2022-08-20 ENCOUNTER — Encounter (HOSPITAL_COMMUNITY): Payer: BC Managed Care – PPO | Admitting: Physical Therapy

## 2022-08-23 ENCOUNTER — Ambulatory Visit (HOSPITAL_COMMUNITY): Payer: BC Managed Care – PPO | Attending: Family Medicine | Admitting: Physical Therapy

## 2022-08-23 DIAGNOSIS — M25562 Pain in left knee: Secondary | ICD-10-CM | POA: Diagnosis not present

## 2022-08-23 DIAGNOSIS — R2689 Other abnormalities of gait and mobility: Secondary | ICD-10-CM | POA: Diagnosis not present

## 2022-08-23 DIAGNOSIS — M6281 Muscle weakness (generalized): Secondary | ICD-10-CM | POA: Insufficient documentation

## 2022-08-23 DIAGNOSIS — R29898 Other symptoms and signs involving the musculoskeletal system: Secondary | ICD-10-CM | POA: Insufficient documentation
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
## 2022-08-28 ENCOUNTER — Ambulatory Visit (HOSPITAL_COMMUNITY): Payer: BC Managed Care – PPO

## 2022-08-28 ENCOUNTER — Encounter (HOSPITAL_COMMUNITY): Payer: Self-pay

## 2022-08-28 DIAGNOSIS — R29898 Other symptoms and signs involving the musculoskeletal system: Secondary | ICD-10-CM | POA: Diagnosis not present

## 2022-08-28 DIAGNOSIS — M6281 Muscle weakness (generalized): Secondary | ICD-10-CM | POA: Diagnosis not present

## 2022-08-28 DIAGNOSIS — M25562 Pain in left knee: Secondary | ICD-10-CM

## 2022-08-28 DIAGNOSIS — R2689 Other abnormalities of gait and mobility: Secondary | ICD-10-CM
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
## 2022-08-30 ENCOUNTER — Ambulatory Visit (HOSPITAL_COMMUNITY): Payer: BC Managed Care – PPO | Admitting: Physical Therapy

## 2022-08-30 DIAGNOSIS — R29898 Other symptoms and signs involving the musculoskeletal system: Secondary | ICD-10-CM

## 2022-08-30 DIAGNOSIS — M6281 Muscle weakness (generalized): Secondary | ICD-10-CM | POA: Diagnosis not present

## 2022-08-30 DIAGNOSIS — R2689 Other abnormalities of gait and mobility: Secondary | ICD-10-CM

## 2022-08-30 DIAGNOSIS — M25562 Pain in left knee: Secondary | ICD-10-CM
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
## 2022-09-03 ENCOUNTER — Ambulatory Visit (HOSPITAL_COMMUNITY): Payer: BC Managed Care – PPO | Admitting: Physical Therapy

## 2022-09-03 DIAGNOSIS — M6281 Muscle weakness (generalized): Secondary | ICD-10-CM | POA: Diagnosis not present

## 2022-09-03 DIAGNOSIS — R2689 Other abnormalities of gait and mobility: Secondary | ICD-10-CM | POA: Diagnosis not present

## 2022-09-03 DIAGNOSIS — R29898 Other symptoms and signs involving the musculoskeletal system: Secondary | ICD-10-CM

## 2022-09-03 DIAGNOSIS — M25562 Pain in left knee: Secondary | ICD-10-CM
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
## 2022-09-05 ENCOUNTER — Telehealth (HOSPITAL_COMMUNITY): Payer: Self-pay | Admitting: Physical Therapy

## 2022-09-05 ENCOUNTER — Encounter (HOSPITAL_COMMUNITY): Payer: BC Managed Care – PPO | Admitting: Physical Therapy

## 2022-09-05 ENCOUNTER — Ambulatory Visit (INDEPENDENT_AMBULATORY_CARE_PROVIDER_SITE_OTHER): Payer: BC Managed Care – PPO | Admitting: Family Medicine

## 2022-09-05 ENCOUNTER — Encounter: Payer: Self-pay | Admitting: Family Medicine

## 2022-09-05 VITALS — BP 112/82 | HR 78 | Temp 98.5°F | Ht 64.0 in | Wt 320.0 lb

## 2022-09-05 DIAGNOSIS — L84 Corns and callosities: Secondary | ICD-10-CM | POA: Diagnosis not present

## 2022-09-05 NOTE — Assessment & Plan Note (Signed)
Referral to podiatry placed for removal per patient request. Recommended better fitting shoes and may try a callus cushion.

## 2022-09-05 NOTE — Telephone Encounter (Signed)
Pt did not show for appt. Called patient who states she overslept.  Reminded of next appt on Monday.  Lurena Nida, PTA/CLT [facility] Ph: [phone]

## 2022-09-05 NOTE — Progress Notes (Signed)
Subjective:  HPI: Lindsay James is a 32 y.o. female presenting on 09/05/2022 for Follow-up (Referral podiatrist)   HPI Patient is in today for concerns about a callus on her left foot. It has been present for several months and is non-healing. She denies swelling, erythema, or drainage. She has tried nothing. She did have one to her right foot that has healed.  Review of Systems  All other systems reviewed and are negative.   Relevant past medical history reviewed and updated as indicated.   Past Medical History:  Diagnosis Date   Acute appendicitis    Anemia    Chlamydia    Gonorrhea    High cholesterol    Hypertension    Migraine    UTI (urinary tract infection)      Past Surgical History:  Procedure Laterality Date   IRRIGATION AND DEBRIDEMENT ABSCESS Left 01/31/2022   Procedure: INCISION AND DRAINAGE OF LEFT THIGH ABSCESS;  Surgeon: Diamantina Monks, MD;  Location: [facility];  Service: General;  Laterality: Left;   LAPAROSCOPIC APPENDECTOMY N/A 01/18/2020   Procedure: APPENDECTOMY LAPAROSCOPIC;  Surgeon: Franky Macho, MD;  Location: [facility];  Service: General;  Laterality: N/A;    Allergies and medications reviewed and updated.   Current Outpatient Medications:    cetirizine (ZYRTEC) 10 MG chewable tablet, Chew 10 mg by mouth daily., Disp: , Rfl:    meloxicam (MOBIC) 7.5 MG tablet, Take 1 tablet (7.5 mg total) by mouth daily., Disp: 30 tablet, Rfl: 0   Prenatal Vit-Fe Fumarate-FA (PRENATAL MULTIVITAMIN) TABS tablet, Take 1 tablet by mouth daily at 12 noon., Disp: 30 tablet, Rfl: 1   fluticasone (FLONASE) 50 MCG/ACT nasal spray, Place 2 sprays into both nostrils daily. (Patient not taking: Reported on 09/05/2022), Disp: 16 g, Rfl: 0  Allergies  Allergen Reactions   Peanut-Containing Drug Products Rash    Objective:   BP 112/82   Pulse 78   Temp 98.5 F (36.9 C) (Oral)   Ht 5\' 4"  (1.626 m)   Wt (!) 320 lb (145.2 kg)   SpO2 99%   BMI 54.93 kg/m       09/05/2022   10:46 AM 07/23/2022   11:23 AM 06/28/2022   10:40 AM  Vitals with BMI  Height 5\' 4"  5\' 4"  5\' 4"   Weight 320 lbs 316 lbs 312 lbs 10 oz  BMI 54.9 54.21 53.63  Systolic 112 100 960  Diastolic 82 78 84  Pulse 78 63 72     Physical Exam Vitals and nursing note reviewed.  Constitutional:      Appearance: Normal appearance. She is normal weight.  HENT:     Head: Normocephalic and atraumatic.  Musculoskeletal:       Feet:  Feet:     Left foot:     Skin integrity: Callus present.     Comments: 0.5 cm papule with central core Skin:    General: Skin is warm and dry.  Neurological:     General: No focal deficit present.     Mental Status: She is alert and oriented to person, place, and time. Mental status is at baseline.  Psychiatric:        Mood and Affect: Mood normal.        Behavior: Behavior normal.        Thought Content: Thought content normal.        Judgment: Judgment normal.     Assessment & Plan:  Corn of foot Assessment & Plan: Referral  to podiatry placed for removal per patient request. Recommended better fitting shoes and may try a callus cushion.      Follow up plan: Return if symptoms worsen or fail to improve.  Lindsay Meo, FNP

## 2022-09-10 ENCOUNTER — Ambulatory Visit (HOSPITAL_COMMUNITY): Payer: BC Managed Care – PPO | Admitting: Physical Therapy

## 2022-09-10 DIAGNOSIS — R2689 Other abnormalities of gait and mobility: Secondary | ICD-10-CM | POA: Diagnosis not present

## 2022-09-10 DIAGNOSIS — M25562 Pain in left knee: Secondary | ICD-10-CM | POA: Diagnosis not present

## 2022-09-10 DIAGNOSIS — R29898 Other symptoms and signs involving the musculoskeletal system: Secondary | ICD-10-CM | POA: Diagnosis not present

## 2022-09-10 DIAGNOSIS — M6281 Muscle weakness (generalized): Secondary | ICD-10-CM | POA: Diagnosis not present
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
## 2022-09-12 ENCOUNTER — Encounter (HOSPITAL_COMMUNITY): Payer: Self-pay

## 2022-09-12 ENCOUNTER — Ambulatory Visit (HOSPITAL_COMMUNITY): Payer: BC Managed Care – PPO

## 2022-09-12 DIAGNOSIS — M6281 Muscle weakness (generalized): Secondary | ICD-10-CM

## 2022-09-12 DIAGNOSIS — R2689 Other abnormalities of gait and mobility: Secondary | ICD-10-CM

## 2022-09-12 DIAGNOSIS — R29898 Other symptoms and signs involving the musculoskeletal system: Secondary | ICD-10-CM

## 2022-09-12 DIAGNOSIS — M25562 Pain in left knee: Secondary | ICD-10-CM | POA: Diagnosis not present
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
## 2022-09-17 ENCOUNTER — Ambulatory Visit (HOSPITAL_COMMUNITY): Payer: BC Managed Care – PPO | Attending: Family Medicine

## 2022-09-17 ENCOUNTER — Encounter (HOSPITAL_COMMUNITY): Payer: Self-pay

## 2022-09-17 DIAGNOSIS — R2689 Other abnormalities of gait and mobility: Secondary | ICD-10-CM | POA: Diagnosis not present

## 2022-09-17 DIAGNOSIS — R29898 Other symptoms and signs involving the musculoskeletal system: Secondary | ICD-10-CM | POA: Insufficient documentation

## 2022-09-17 DIAGNOSIS — M6281 Muscle weakness (generalized): Secondary | ICD-10-CM | POA: Diagnosis not present

## 2022-09-17 DIAGNOSIS — M25562 Pain in left knee: Secondary | ICD-10-CM | POA: Diagnosis not present
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
## 2022-09-19 ENCOUNTER — Ambulatory Visit (HOSPITAL_COMMUNITY): Payer: BC Managed Care – PPO | Admitting: Physical Therapy

## 2022-09-19 ENCOUNTER — Encounter (HOSPITAL_COMMUNITY): Payer: Self-pay | Admitting: Physical Therapy

## 2022-09-19 DIAGNOSIS — M6281 Muscle weakness (generalized): Secondary | ICD-10-CM | POA: Diagnosis not present

## 2022-09-19 DIAGNOSIS — M25562 Pain in left knee: Secondary | ICD-10-CM | POA: Diagnosis not present

## 2022-09-19 DIAGNOSIS — R29898 Other symptoms and signs involving the musculoskeletal system: Secondary | ICD-10-CM | POA: Diagnosis not present

## 2022-09-19 DIAGNOSIS — R2689 Other abnormalities of gait and mobility: Secondary | ICD-10-CM
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
## 2022-09-24 ENCOUNTER — Ambulatory Visit (HOSPITAL_COMMUNITY): Payer: BC Managed Care – PPO | Admitting: Physical Therapy

## 2022-09-24 ENCOUNTER — Encounter (HOSPITAL_COMMUNITY): Payer: Self-pay | Admitting: Physical Therapy

## 2022-09-24 DIAGNOSIS — R29898 Other symptoms and signs involving the musculoskeletal system: Secondary | ICD-10-CM | POA: Diagnosis not present

## 2022-09-24 DIAGNOSIS — R2689 Other abnormalities of gait and mobility: Secondary | ICD-10-CM

## 2022-09-24 DIAGNOSIS — M25562 Pain in left knee: Secondary | ICD-10-CM

## 2022-09-24 DIAGNOSIS — M6281 Muscle weakness (generalized): Secondary | ICD-10-CM | POA: Diagnosis not present
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
## 2022-09-26 ENCOUNTER — Encounter (HOSPITAL_COMMUNITY): Payer: BC Managed Care – PPO | Admitting: Physical Therapy

## 2022-09-27 ENCOUNTER — Ambulatory Visit (INDEPENDENT_AMBULATORY_CARE_PROVIDER_SITE_OTHER): Payer: BC Managed Care – PPO | Admitting: Family Medicine

## 2022-09-27 ENCOUNTER — Encounter: Payer: Self-pay | Admitting: Neurology

## 2022-09-27 ENCOUNTER — Encounter: Payer: Self-pay | Admitting: Family Medicine

## 2022-09-27 VITALS — BP 114/80 | HR 73 | Temp 98.5°F | Ht 64.0 in | Wt 326.0 lb

## 2022-09-27 DIAGNOSIS — N939 Abnormal uterine and vaginal bleeding, unspecified: Secondary | ICD-10-CM | POA: Diagnosis not present

## 2022-09-27 DIAGNOSIS — G43709 Chronic migraine without aura, not intractable, without status migrainosus: Secondary | ICD-10-CM

## 2022-09-27 LAB — PREGNANCY, URINE: Preg Test, Ur: NEGATIVE

## 2022-09-27 MED ORDER — FLUTICASONE PROPIONATE 50 MCG/ACT NA SUSP: 2.0000 | Freq: Every day | NASAL | 0 refills | Status: DC

## 2022-09-27 NOTE — Progress Notes (Signed)
Subjective:  HPI: Lindsay James is a 32 y.o. female presenting on 09/27/2022 for Follow-up (3 mos f/u wt loss/need to do urine pregnancy test/`)   HPI Patient is in today for worsening migraines for last 2 weeks, right sided shooting pain from the back of her head to front once yesterday. She does endorse blurred vision. She has had migraines since she was a child. They occur daily throughout the day with only a few hours of relief a day. Sleep helps but they are worse when she wakes up and eats. She describes them as bilateral and in the forehead, feels like a tight band. They are worse when she coughs and sneezes.  Denies sensitivity to light or sound, nausea, vomiting. Has tried Advil without improvement.  and a very light period with spotting this cycle. She did have an implant placed in January. She endorses light abdominal cramping, no painful intercourse, vaginal discharge, no new sexual partners.  Review of Systems  All other systems reviewed and are negative.   Relevant past medical history reviewed and updated as indicated.   Past Medical History:  Diagnosis Date   Acute appendicitis    Anemia    Chlamydia    Gonorrhea    High cholesterol    Hypertension    Migraine    UTI (urinary tract infection)      Past Surgical History:  Procedure Laterality Date   IRRIGATION AND DEBRIDEMENT ABSCESS Left 01/31/2022   Procedure: INCISION AND DRAINAGE OF LEFT THIGH ABSCESS;  Surgeon: Diamantina Monks, MD;  Location: [facility];  Service: General;  Laterality: Left;   LAPAROSCOPIC APPENDECTOMY N/A 01/18/2020   Procedure: APPENDECTOMY LAPAROSCOPIC;  Surgeon: Franky Macho, MD;  Location: [facility];  Service: General;  Laterality: N/A;    Allergies and medications reviewed and updated.   Current Outpatient Medications:    cetirizine (ZYRTEC) 10 MG chewable tablet, Chew 10 mg by mouth daily., Disp: , Rfl:    meloxicam (MOBIC) 7.5 MG tablet, Take 1 tablet (7.5 mg total) by mouth  daily., Disp: 30 tablet, Rfl: 0   Prenatal Vit-Fe Fumarate-FA (PRENATAL MULTIVITAMIN) TABS tablet, Take 1 tablet by mouth daily at 12 noon., Disp: 30 tablet, Rfl: 1   fluticasone (FLONASE) 50 MCG/ACT nasal spray, Place 2 sprays into both nostrils daily., Disp: 16 g, Rfl: 0  Allergies  Allergen Reactions   Peanut-Containing Drug Products Rash    Objective:   BP 114/80   Pulse 73   Temp 98.5 F (36.9 C) (Oral)   Ht 5\' 4"  (1.626 m)   Wt (!) 326 lb (147.9 kg)   LMP 06/22/2022 (Approximate)   SpO2 98%   BMI 55.96 kg/m      09/27/2022   10:16 AM 09/05/2022   10:46 AM 07/23/2022   11:23 AM  Vitals with BMI  Height 5\' 4"  5\' 4"  5\' 4"   Weight 326 lbs 320 lbs 316 lbs  BMI 55.93 54.9 54.21  Systolic 114 112 409  Diastolic 80 82 78  Pulse 73 78 63     Physical Exam Vitals and nursing note reviewed.  Constitutional:      Appearance: Normal appearance. She is normal weight.  HENT:     Head: Normocephalic and atraumatic.  Skin:    General: Skin is warm and dry.  Neurological:     General: No focal deficit present.     Mental Status: She is alert and oriented to person, place, and time. Mental status is at baseline.  Psychiatric:        Mood and Affect: Mood normal.        Behavior: Behavior normal.        Thought Content: Thought content normal.        Judgment: Judgment normal.     Assessment & Plan:  Chronic migraine without aura without status migrainosus, not intractable Assessment & Plan: Chronic. Neurology referral has been authorized, provided patient with contact information to call and schedule appt as they were previously unable to reach her. Has tried NSAIDs, provided with Nurtec sample. Will order MRI given worsening severity and frequency and symptoms worsening with coughing, sneezing, etc. No neurologic changes on exam. Seek immediate medical care for thunderclap headache, severely worsening symptoms, altered mental status.  Orders: -     MR BRAIN W WO CONTRAST;  Future  Abnormal uterine bleeding (AUB) Assessment & Plan: Negative urine pregnancy. Reassured patient this could be due to her implant and urged her to reach out to her OB if symptoms persist.  Orders: -     Pregnancy, urine  Other orders -     Fluticasone Propionate; Place 2 sprays into both nostrils daily.  Dispense: 16 g; Refill: 0     Follow up plan: Return if symptoms worsen or fail to improve.  Park Meo, FNP

## 2022-09-27 NOTE — Assessment & Plan Note (Signed)
Chronic. Neurology referral has been authorized, provided patient with contact information to call and schedule appt as they were previously unable to reach her. Has tried NSAIDs, provided with Nurtec sample. Will order MRI given worsening severity and frequency and symptoms worsening with coughing, sneezing, etc. No neurologic changes on exam. Seek immediate medical care for thunderclap headache, severely worsening symptoms, altered mental status.

## 2022-09-27 NOTE — Assessment & Plan Note (Signed)
Negative urine pregnancy. Reassured patient this could be due to her implant and urged her to reach out to her OB if symptoms persist.

## 2022-10-01 ENCOUNTER — Encounter (HOSPITAL_COMMUNITY): Payer: Self-pay

## 2022-10-01 ENCOUNTER — Other Ambulatory Visit: Payer: Self-pay

## 2022-10-01 ENCOUNTER — Emergency Department (HOSPITAL_COMMUNITY): Payer: BC Managed Care – PPO

## 2022-10-01 ENCOUNTER — Emergency Department (HOSPITAL_COMMUNITY)
Admission: EM | Admit: 2022-10-01 | Discharge: 2022-10-02 | Disposition: A | Discharge status: Home / Self Care | Payer: BC Managed Care – PPO | Attending: Emergency Medicine | Admitting: Emergency Medicine

## 2022-10-01 ENCOUNTER — Encounter (HOSPITAL_COMMUNITY): Payer: BC Managed Care – PPO | Admitting: Physical Therapy

## 2022-10-01 DIAGNOSIS — R079 Chest pain, unspecified: Secondary | ICD-10-CM | POA: Insufficient documentation

## 2022-10-01 DIAGNOSIS — Z91010 Allergy to peanuts: Secondary | ICD-10-CM | POA: Insufficient documentation

## 2022-10-01 DIAGNOSIS — N189 Chronic kidney disease, unspecified: Secondary | ICD-10-CM | POA: Diagnosis not present

## 2022-10-01 DIAGNOSIS — D72829 Elevated white blood cell count, unspecified: Secondary | ICD-10-CM | POA: Insufficient documentation

## 2022-10-01 DIAGNOSIS — F17200 Nicotine dependence, unspecified, uncomplicated: Secondary | ICD-10-CM | POA: Diagnosis not present

## 2022-10-01 DIAGNOSIS — R0789 Other chest pain: Secondary | ICD-10-CM | POA: Diagnosis not present

## 2022-10-01 LAB — CBC
HCT: 37.3 % (ref 36.0–46.0)
Hemoglobin: 11.7 g/dL — ABNORMAL LOW (ref 12.0–15.0)
MCH: 23 pg — ABNORMAL LOW (ref 26.0–34.0)
MCHC: 31.4 g/dL (ref 30.0–36.0)
MCV: 73.4 fL — ABNORMAL LOW (ref 80.0–100.0)
Platelets: 255 10*3/uL (ref 150–400)
RBC: 5.08 MIL/uL (ref 3.87–5.11)
RDW: 17.1 % — ABNORMAL HIGH (ref 11.5–15.5)
WBC: 12.9 10*3/uL — ABNORMAL HIGH (ref 4.0–10.5)
nRBC: 0 % (ref 0.0–0.2)

## 2022-10-01 LAB — TROPONIN I (HIGH SENSITIVITY): Troponin I (High Sensitivity): 2 ng/L (ref <18)

## 2022-10-01 LAB — BASIC METABOLIC PANEL
Anion gap: 10 (ref 5–15)
BUN: 19 mg/dL (ref 6–20)
CO2: 23 mmol/L (ref 22–32)
Calcium: 8.7 mg/dL — ABNORMAL LOW (ref 8.9–10.3)
Chloride: 103 mmol/L (ref 98–111)
Creatinine, Ser: 1.24 mg/dL — ABNORMAL HIGH (ref 0.44–1.00)
GFR, Estimated: 60 mL/min — ABNORMAL LOW (ref >60)
Glucose, Bld: 99 mg/dL (ref 70–99)
Potassium: 3.6 mmol/L (ref 3.5–5.1)
Sodium: 136 mmol/L (ref 135–145)
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
## 2022-10-01 NOTE — ED Triage Notes (Signed)
Pt c/o x last few days that radiates to left arm. Describes pain as "sharp needles". States pain got worse today.

## 2022-10-02 ENCOUNTER — Encounter (HOSPITAL_COMMUNITY): Payer: BC Managed Care – PPO

## 2022-10-02 DIAGNOSIS — R079 Chest pain, unspecified: Secondary | ICD-10-CM | POA: Diagnosis not present

## 2022-10-02 LAB — D-DIMER, QUANTITATIVE: D-Dimer, Quant: <0.27 ug/mL-FEU (ref 0.00–0.50)

## 2022-10-02 LAB — TROPONIN I (HIGH SENSITIVITY): Troponin I (High Sensitivity): 3 ng/L (ref <18)

## 2022-10-02 MED ORDER — IBUPROFEN 800 MG PO TABS: 800.0000 mg | ORAL_TABLET | Freq: Four times a day (QID) | ORAL | 0 refills | Status: DC | PRN

## 2022-10-02 NOTE — ED Notes (Signed)
Called lab to find out why 2nd drawn dimer was not in process. Lab states this hemolyzed as well.

## 2022-10-03 ENCOUNTER — Telehealth: Payer: Self-pay

## 2022-10-03 NOTE — Transitions of Care (Post Inpatient/ED Visit) (Signed)
   10/03/2022  Name: Lindsay James MRN: [id number] DOB: 15-Aug-1990  Today's TOC FU Call Status: Today's TOC FU Call Status:: Unsuccessul Call (1st Attempt) Unsuccessful Call (1st Attempt) Date: 10/03/22  Attempted to reach the patient regarding the most recent Inpatient/ED visit.  Follow Up Plan: Additional outreach attempts will be made to reach the patient to complete the Transitions of Care (Post Inpatient/ED visit) call.   Signature  Woodfin Ganja LPN [facility] Nurse Health Advisor Direct Dial [phone]

## 2022-10-04 NOTE — Transitions of Care (Post Inpatient/ED Visit) (Signed)
   10/04/2022  Name: Lindsay James MRN: [id number] DOB: March 14, 1991  Today's TOC FU Call Status: Today's TOC FU Call Status:: Successful TOC FU Call Competed Unsuccessful Call (1st Attempt) Date: 10/03/22 [facility] FU Call Complete Date: 10/04/22  Transition Care Management Follow-up Telephone Call Date of Discharge: 10/02/22 [facility] Type of Discharge: Emergency Department Reason for ED Visit: Cardiac Conditions, Other: (nonspecific chest pain) How have you been since you were released from the hospital?: Better Any questions or concerns?: No  Items Reviewed: Did you receive and understand the discharge instructions provided?: Yes Medications obtained,verified, and reconciled?: Yes (Medications Reviewed) Any new allergies since your discharge?: No Dietary orders reviewed?: Yes Do you have support at home?: Yes  Medications Reviewed Today: Medications Reviewed Today     Reviewed by Merleen Nicely, LPN (Licensed Practical Nurse) on 10/04/22 at 1128  Med List Status: <None>   Medication Order Taking? Sig Documenting Provider Last Dose Status Informant  cetirizine (ZYRTEC) 10 MG chewable tablet [id number] Yes Chew 10 mg by mouth daily. [provider] Taking Active   fluticasone (FLONASE) 50 MCG/ACT nasal spray [id number] Yes Place 2 sprays into both nostrils daily. Park Meo, FNP Taking Active   ibuprofen (ADVIL) 800 MG tablet [id number] Yes Take 1 tablet (800 mg total) by mouth every 6 (six) hours as needed for moderate pain. Gilda Crease, MD Taking Active   meloxicam [facility]) 7.5 MG tablet [id number] Yes Take 1 tablet (7.5 mg total) by mouth daily. Park Meo, FNP Taking Active   Prenatal Vit-Fe Fumarate-FA (PRENATAL MULTIVITAMIN) TABS tablet [id number] Yes Take 1 tablet by mouth daily at 12 noon. [place] Bing, MD Taking Active     Discontinued 10/28/19 1256             Home Care and Equipment/Supplies: Were Home Health  Services Ordered?: NA Any new equipment or medical supplies ordered?: NA  Functional Questionnaire: Do you need assistance with bathing/showering or dressing?: No Do you need assistance with meal preparation?: No Do you need assistance with eating?: No Do you have difficulty maintaining continence: No Do you need assistance with getting out of bed/getting out of a chair/moving?: No Do you have difficulty managing or taking your medications?: No  Follow up appointments reviewed: PCP Follow-up appointment confirmed?: Yes Date of PCP follow-up appointment?: 10/09/22 Follow-up Provider: Kurtis Bushman FNP Specialist [facility] Follow-up appointment confirmed?: No Do you need transportation to your follow-up appointment?: No Do you understand care options if your condition(s) worsen?: Yes-patient verbalized understanding    SIGNATURE  Woodfin Ganja LPN [facility] Nurse Health Advisor Direct Dial [phone]

## 2022-10-09 ENCOUNTER — Ambulatory Visit (INDEPENDENT_AMBULATORY_CARE_PROVIDER_SITE_OTHER): Payer: BC Managed Care – PPO | Admitting: Family Medicine

## 2022-10-09 VITALS — BP 110/72 | HR 59 | Temp 98.6°F | Ht 64.0 in | Wt 326.0 lb

## 2022-10-09 DIAGNOSIS — G43709 Chronic migraine without aura, not intractable, without status migrainosus: Secondary | ICD-10-CM | POA: Diagnosis not present

## 2022-10-09 DIAGNOSIS — R079 Chest pain, unspecified: Secondary | ICD-10-CM | POA: Diagnosis not present

## 2022-10-09 MED ORDER — NURTEC 75 MG PO TBDP: 75.0000 mg | ORAL_TABLET | Freq: Every day | ORAL | 0 refills | Status: DC | PRN

## 2022-10-09 NOTE — Assessment & Plan Note (Signed)
Symptoms resolved. This was an isolated event. We discussed the importance of seeking medical care for chest pain, dyspnea with exertion, palpitations, vision changes, lightheadedness, dizziness.

## 2022-10-09 NOTE — Assessment & Plan Note (Signed)
Improved with Nurtec sample, will provide rx today. Appointment scheduled for MRI and Neurology. Symptoms stable

## 2022-10-09 NOTE — Progress Notes (Signed)
Subjective:  HPI: Lindsay James is a 32 y.o. female presenting on 10/09/2022 for Well Child (TCM DC [facility] ER 10-02-22 Dx: nonspecific chest pain/)   HPI Patient is in today for hospital follow-up. She presented to the ED at [facility] 7 days ago after experiencing left sided chest pain while at work that radiated down her left arm. Her workup was negative and her symptoms have not occurred since. She denies chest pain, palpitations, shortness of breath, vision changes, lightheadedness or dizziness.  ER FOLLOW UP Time since discharge: 7 days Hospital/facility: [facility] Diagnosis: Nonspecific chest pain Procedures/tests: EKG, BMP, CBC, Troponin, CXR, D-dimer Consultants: none New medications: Ibuprofen Discharge instructions: Ibuprofen PRN Status: better   Review of Systems  All other systems reviewed and are negative.   Relevant past medical history reviewed and updated as indicated.   Past Medical History:  Diagnosis Date   Acute appendicitis    Anemia    Chlamydia    Gonorrhea    High cholesterol    Hypertension    Migraine    UTI (urinary tract infection)      Past Surgical History:  Procedure Laterality Date   IRRIGATION AND DEBRIDEMENT ABSCESS Left 01/31/2022   Procedure: INCISION AND DRAINAGE OF LEFT THIGH ABSCESS;  Surgeon: Diamantina Monks, MD;  Location: [facility] OR;  Service: General;  Laterality: Left;   LAPAROSCOPIC APPENDECTOMY N/A 01/18/2020   Procedure: APPENDECTOMY LAPAROSCOPIC;  Surgeon: Franky Macho, MD;  Location: [facility] ORS;  Service: General;  Laterality: N/A;    Allergies and medications reviewed and updated.   Current Outpatient Medications:    cetirizine (ZYRTEC) 10 MG chewable tablet, Chew 10 mg by mouth daily., Disp: , Rfl:    fluticasone (FLONASE) 50 MCG/ACT nasal spray, Place 2 sprays into both nostrils daily., Disp: 16 g, Rfl: 0   ibuprofen (ADVIL) 800 MG tablet, Take 1 tablet (800 mg total) by mouth every 6 (six) hours as needed for moderate  pain., Disp: 20 tablet, Rfl: 0   meloxicam (MOBIC) 7.5 MG tablet, Take 1 tablet (7.5 mg total) by mouth daily., Disp: 30 tablet, Rfl: 0   Prenatal Vit-Fe Fumarate-FA (PRENATAL MULTIVITAMIN) TABS tablet, Take 1 tablet by mouth daily at 12 noon., Disp: 30 tablet, Rfl: 1   Rimegepant Sulfate (NURTEC) 75 MG TBDP, Take 1 tablet (75 mg total) by mouth daily as needed (headache)., Disp: 18 tablet, Rfl: 0  Allergies  Allergen Reactions   Peanut-Containing Drug Products Rash    Objective:   BP 110/72   Pulse (!) 59   Temp 98.6 F (37 C) (Oral)   Ht 5\' 4"  (1.626 m)   Wt (!) 326 lb (147.9 kg)   LMP 06/22/2022 (Approximate)   SpO2 97%   BMI 55.96 kg/m      10/09/2022   12:17 PM 10/01/2022   11:18 PM 10/01/2022   11:15 PM  Vitals with BMI  Height 5\' 4"     Weight 326 lbs    BMI 55.93    Systolic 110    Diastolic 72    Pulse 59 88 83     Physical Exam Vitals and nursing note reviewed.  Constitutional:      Appearance: Normal appearance. She is normal weight.  HENT:     Head: Normocephalic and atraumatic.  Cardiovascular:     Rate and Rhythm: Normal rate and regular rhythm.     Pulses: Normal pulses.     Heart sounds: Normal heart sounds.  Pulmonary:  Effort: Pulmonary effort is normal.     Breath sounds: Normal breath sounds.  Skin:    General: Skin is warm and dry.  Neurological:     General: No focal deficit present.     Mental Status: She is alert and oriented to person, place, and time. Mental status is at baseline.  Psychiatric:        Mood and Affect: Mood normal.        Behavior: Behavior normal.        Thought Content: Thought content normal.        Judgment: Judgment normal.     Assessment & Plan:  Nonspecific chest pain Assessment & Plan: Symptoms resolved. This was an isolated event. We discussed the importance of seeking medical care for chest pain, dyspnea with exertion, palpitations, vision changes, lightheadedness, dizziness.    Chronic migraine  without aura without status migrainosus, not intractable Assessment & Plan: Improved with Nurtec sample, will provide rx today. Appointment scheduled for MRI and Neurology. Symptoms stable   Other orders -     Nurtec; Take 1 tablet (75 mg total) by mouth daily as needed (headache).  Dispense: 18 tablet; Refill: 0     Follow up plan: No follow-ups on file.  Park Meo, FNP

## 2022-10-12 ENCOUNTER — Telehealth: Payer: Self-pay

## 2022-10-12 NOTE — Telephone Encounter (Signed)
PA-Nurtec send to plan. Waiting for insurance to approve

## 2022-10-18 ENCOUNTER — Telehealth: Payer: Self-pay

## 2022-10-18 NOTE — Telephone Encounter (Signed)
PA-Nurtec send to plan

## 2022-10-19 NOTE — Telephone Encounter (Signed)
FYI:  PA-Nurtec  Has been denied by insurance 10/18/22.  Pls advice

## 2022-11-13 ENCOUNTER — Encounter: Payer: Self-pay | Admitting: Family Medicine

## 2022-11-16 ENCOUNTER — Ambulatory Visit
Admission: RE | Admit: 2022-11-16 | Discharge: 2022-11-16 | Disposition: A | Discharge status: Home / Self Care | Payer: BC Managed Care – PPO | Source: Ambulatory Visit | Attending: Family Medicine | Admitting: Family Medicine

## 2022-11-16 DIAGNOSIS — G43709 Chronic migraine without aura, not intractable, without status migrainosus: Secondary | ICD-10-CM | POA: Diagnosis not present

## 2022-11-16 MED ORDER — GADOPICLENOL 0.5 MMOL/ML IV SOLN
10.0000 mL | Freq: Once | INTRAVENOUS | Status: AC | PRN
  Administered 2022-11-16: 10 mL via INTRAVENOUS

## 2022-11-27 ENCOUNTER — Other Ambulatory Visit: Payer: Self-pay | Admitting: Family Medicine

## 2022-11-27 DIAGNOSIS — G43709 Chronic migraine without aura, not intractable, without status migrainosus: Secondary | ICD-10-CM

## 2023-01-24 NOTE — Progress Notes (Deleted)
NEUROLOGY CONSULTATION NOTE  GIAVONNI CIZEK MRN: [id number] DOB: 1990/08/08  Referring provider: Kurtis Bushman, FNP Primary care provider: Kurtis Bushman, FNP  Reason for consult:  migraines  Assessment/Plan:   Migraine *** aura, without status migrainosus, not intractable 7 mm pineal cyst.  Incidental finding White matter changes on brain MRI likely sequelae of migraine   Migraine prevention:  *** Migraine rescue:  *** Limit use of pain relievers to no more than 2 days out of week to prevent risk of rebound or medication-overuse headache. Keep headache diary Follow up ***    Subjective:  Lindsay James is a 32 year old female who presents for migraines.  History supplemented by referring provider's note.  Onset:  *** Location:  *** Quality:  *** Intensity:  ***.  *** denies new headache, thunderclap headache or severe headache that wakes *** from sleep. Aura:  *** Prodrome:  *** Postdrome:  *** Associated symptoms:  ***.  *** denies associated unilateral numbness or weakness. Duration:  *** Frequency:  *** Frequency of abortive medication: *** Triggers:  *** Relieving factors:  *** Activity:  ***  MRI of brain with and without contrast on 11/16/2022 personally reviewed:  1. No evidence of an acute intracranial abnormality. 2. Several small foci of T2 FLAIR hyperintense signal abnormality within the cerebral white matter (with a subcortical white matter predominance). These signal changes are nonspecific and differential considerations include sequelae of chronic migraine headaches and early/age advanced chronic small vessel ischemic disease, among others.3. 7 mm pineal cyst.  Past NSAIDS/analgesics:  naproxen, Tylenol, BC powder, Toradol Past abortive triptans:  *** Past abortive ergotamine:  *** Past muscle relaxants:  Flexeril Past anti-emetic:  promethazine, Zofran Past antihypertensive medications:  *** Past antidepressant medications:  *** Past  anticonvulsant medications:  gabapentin Past anti-CGRP:  *** Past vitamins/Herbal/Supplements:  *** Past antihistamines/decongestants:  Benadryl Other past therapies:  ***  Current NSAIDS/analgesics:  ibuprofen 800mg , meloxicam Current triptans:  none Current ergotamine:  none Current anti-emetic:  none Current muscle relaxants:  none Current Antihypertensive medications:  none Current Antidepressant medications:  none Current Anticonvulsant medications:  none Current anti-CGRP:  Nurtec 75mg  daily PRN Current Vitamins/Herbal/Supplements:  MVI Current Antihistamines/Decongestants:  Flonase Other therapy:  *** Birth control:  *** Other medications:  ***   Caffeine:  *** Alcohol:  *** Smoker:  *** Diet:  *** Exercise:  *** Depression:  ***; Anxiety:  *** Other pain:  *** Sleep hygiene:  *** Family history of headache:  ***      PAST MEDICAL HISTORY: Past Medical History:  Diagnosis Date   Acute appendicitis    Anemia    Chlamydia    Gonorrhea    High cholesterol    Hypertension    Migraine    UTI (urinary tract infection)     PAST SURGICAL HISTORY: Past Surgical History:  Procedure Laterality Date   IRRIGATION AND DEBRIDEMENT ABSCESS Left 01/31/2022   Procedure: INCISION AND DRAINAGE OF LEFT THIGH ABSCESS;  Surgeon: Diamantina Monks, MD;  Location: [facility] OR;  Service: General;  Laterality: Left;   LAPAROSCOPIC APPENDECTOMY N/A 01/18/2020   Procedure: APPENDECTOMY LAPAROSCOPIC;  Surgeon: Franky Macho, MD;  Location: [facility] ORS;  Service: General;  Laterality: N/A;    MEDICATIONS: Current Outpatient Medications on File Prior to Visit  Medication Sig Dispense Refill   cetirizine (ZYRTEC) 10 MG chewable tablet Chew 10 mg by mouth daily.     fluticasone (FLONASE) 50 MCG/ACT nasal spray Place 2 sprays into both nostrils daily. 16  g 0   ibuprofen (ADVIL) 800 MG tablet Take 1 tablet (800 mg total) by mouth every 6 (six) hours as needed for moderate pain. 20 tablet 0    meloxicam (MOBIC) 7.5 MG tablet Take 1 tablet (7.5 mg total) by mouth daily. 30 tablet 0   Prenatal Vit-Fe Fumarate-FA (PRENATAL MULTIVITAMIN) TABS tablet Take 1 tablet by mouth daily at 12 noon. 30 tablet 1   Rimegepant Sulfate (NURTEC) 75 MG TBDP Take 1 tablet (75 mg total) by mouth daily as needed (headache). 18 tablet 0   [DISCONTINUED] promethazine (PHENERGAN) 25 MG tablet Take 1 tablet (25 mg total) by mouth every 6 (six) hours as needed for nausea or vomiting. 30 tablet 0   No current facility-administered medications on file prior to visit.    ALLERGIES: Allergies  Allergen Reactions   Peanut-Containing Drug Products Rash    FAMILY HISTORY: Family History  Problem Relation Age of Onset   Diabetes Maternal Grandmother    Cancer Father    Asthma Mother    Arthritis/Rheumatoid Mother    Kidney Stones Mother     Objective:  *** General: No acute distress.  Patient appears well-groomed.   Head:  Normocephalic/atraumatic Eyes:  fundi examined but not visualized Neck: supple, no paraspinal tenderness, full range of motion Back: No paraspinal tenderness Heart: regular rate and rhythm Lungs: Clear to auscultation bilaterally. Vascular: No carotid bruits. Neurological Exam: Mental status: alert and oriented to person, place, and time, speech fluent and not dysarthric, language intact. Cranial nerves: CN I: not tested CN II: pupils equal, round and reactive to light, visual fields intact CN III, IV, VI:  full range of motion, no nystagmus, no ptosis CN V: facial sensation intact. CN VII: upper and lower face symmetric CN VIII: hearing intact CN IX, X: gag intact, uvula midline CN XI: sternocleidomastoid and trapezius muscles intact CN XII: tongue midline Bulk & Tone: normal, no fasciculations. Motor:  muscle strength 5/5 throughout Sensation:  Pinprick, temperature and vibratory sensation intact. Deep Tendon Reflexes:  2+ throughout,  toes downgoing.   Finger to nose  testing:  Without dysmetria.   Heel to shin:  Without dysmetria.   Gait:  Normal station and stride.  Romberg negative.    Thank you for allowing me to take part in the care of this patient.  Shon Millet, DO  CC: ***

## 2023-01-28 ENCOUNTER — Ambulatory Visit: Payer: BC Managed Care – PPO | Admitting: Neurology

## 2023-01-29 NOTE — Progress Notes (Unsigned)
 NEUROLOGY CONSULTATION NOTE  Lindsay James MRN: [id number] DOB: 1990/08/08  Referring provider: Kurtis Bushman, FNP Primary care provider: Kurtis Bushman, FNP  Reason for consult:  migraines  Assessment/Plan:   Migraine *** aura, without status migrainosus, not intractable 7 mm pineal cyst.  Incidental finding White matter changes on brain MRI likely sequelae of migraine   Migraine prevention:  *** Migraine rescue:  *** Limit use of pain relievers to no more than 2 days out of week to prevent risk of rebound or medication-overuse headache. Keep headache diary Follow up ***    Subjective:  Lindsay James is a 32 year old female who presents for migraines.  History supplemented by referring provider's note.  Onset:  *** Location:  *** Quality:  *** Intensity:  ***.  *** denies new headache, thunderclap headache or severe headache that wakes *** from sleep. Aura:  *** Prodrome:  *** Postdrome:  *** Associated symptoms:  ***.  *** denies associated unilateral numbness or weakness. Duration:  *** Frequency:  *** Frequency of abortive medication: *** Triggers:  *** Relieving factors:  *** Activity:  ***  MRI of brain with and without contrast on 11/16/2022 personally reviewed:  1. No evidence of an acute intracranial abnormality. 2. Several small foci of T2 FLAIR hyperintense signal abnormality within the cerebral white matter (with a subcortical white matter predominance). These signal changes are nonspecific and differential considerations include sequelae of chronic migraine headaches and early/age advanced chronic small vessel ischemic disease, among others.3. 7 mm pineal cyst.  Past NSAIDS/analgesics:  naproxen, Tylenol, BC powder, Toradol Past abortive triptans:  *** Past abortive ergotamine:  *** Past muscle relaxants:  Flexeril Past anti-emetic:  promethazine, Zofran Past antihypertensive medications:  *** Past antidepressant medications:  *** Past  anticonvulsant medications:  gabapentin Past anti-CGRP:  *** Past vitamins/Herbal/Supplements:  *** Past antihistamines/decongestants:  Benadryl Other past therapies:  ***  Current NSAIDS/analgesics:  ibuprofen 800mg , meloxicam Current triptans:  none Current ergotamine:  none Current anti-emetic:  none Current muscle relaxants:  none Current Antihypertensive medications:  none Current Antidepressant medications:  none Current Anticonvulsant medications:  none Current anti-CGRP:  Nurtec 75mg  daily PRN Current Vitamins/Herbal/Supplements:  MVI Current Antihistamines/Decongestants:  Flonase Other therapy:  *** Birth control:  *** Other medications:  ***   Caffeine:  *** Alcohol:  *** Smoker:  *** Diet:  *** Exercise:  *** Depression:  ***; Anxiety:  *** Other pain:  *** Sleep hygiene:  *** Family history of headache:  ***      PAST MEDICAL HISTORY: Past Medical History:  Diagnosis Date   Acute appendicitis    Anemia    Chlamydia    Gonorrhea    High cholesterol    Hypertension    Migraine    UTI (urinary tract infection)     PAST SURGICAL HISTORY: Past Surgical History:  Procedure Laterality Date   IRRIGATION AND DEBRIDEMENT ABSCESS Left 01/31/2022   Procedure: INCISION AND DRAINAGE OF LEFT THIGH ABSCESS;  Surgeon: Diamantina Monks, MD;  Location: [facility];  Service: General;  Laterality: Left;   LAPAROSCOPIC APPENDECTOMY N/A 01/18/2020   Procedure: APPENDECTOMY LAPAROSCOPIC;  Surgeon: Franky Macho, MD;  Location: [facility];  Service: General;  Laterality: N/A;    MEDICATIONS: Current Outpatient Medications on File Prior to Visit  Medication Sig Dispense Refill   cetirizine (ZYRTEC) 10 MG chewable tablet Chew 10 mg by mouth daily.     fluticasone (FLONASE) 50 MCG/ACT nasal spray Place 2 sprays into both nostrils daily. 16  g 0   ibuprofen (ADVIL) 800 MG tablet Take 1 tablet (800 mg total) by mouth every 6 (six) hours as needed for moderate pain. 20 tablet 0    meloxicam (MOBIC) 7.5 MG tablet Take 1 tablet (7.5 mg total) by mouth daily. 30 tablet 0   Prenatal Vit-Fe Fumarate-FA (PRENATAL MULTIVITAMIN) TABS tablet Take 1 tablet by mouth daily at 12 noon. 30 tablet 1   Rimegepant Sulfate (NURTEC) 75 MG TBDP Take 1 tablet (75 mg total) by mouth daily as needed (headache). 18 tablet 0   [DISCONTINUED] promethazine (PHENERGAN) 25 MG tablet Take 1 tablet (25 mg total) by mouth every 6 (six) hours as needed for nausea or vomiting. 30 tablet 0   No current facility-administered medications on file prior to visit.    ALLERGIES: Allergies  Allergen Reactions   Peanut-Containing Drug Products Rash    FAMILY HISTORY: Family History  Problem Relation Age of Onset   Diabetes Maternal Grandmother    Cancer Father    Asthma Mother    Arthritis/Rheumatoid Mother    Kidney Stones Mother     Objective:  *** General: No acute distress.  Patient appears well-groomed.   Head:  Normocephalic/atraumatic Eyes:  fundi examined but not visualized Neck: supple, no paraspinal tenderness, full range of motion Back: No paraspinal tenderness Heart: regular rate and rhythm Lungs: Clear to auscultation bilaterally. Vascular: No carotid bruits. Neurological Exam: Mental status: alert and oriented to person, place, and time, speech fluent and not dysarthric, language intact. Cranial nerves: CN I: not tested CN II: pupils equal, round and reactive to light, visual fields intact CN III, IV, VI:  full range of motion, no nystagmus, no ptosis CN V: facial sensation intact. CN VII: upper and lower face symmetric CN VIII: hearing intact CN IX, X: gag intact, uvula midline CN XI: sternocleidomastoid and trapezius muscles intact CN XII: tongue midline Bulk & Tone: normal, no fasciculations. Motor:  muscle strength 5/5 throughout Sensation:  Pinprick, temperature and vibratory sensation intact. Deep Tendon Reflexes:  2+ throughout,  toes downgoing.   Finger to nose  testing:  Without dysmetria.   Heel to shin:  Without dysmetria.   Gait:  Normal station and stride.  Romberg negative.    Thank you for allowing me to take part in the care of this patient.  Shon Millet, DO  CC: ***

## 2023-01-30 ENCOUNTER — Encounter: Payer: Self-pay | Admitting: Neurology

## 2023-01-30 ENCOUNTER — Ambulatory Visit (INDEPENDENT_AMBULATORY_CARE_PROVIDER_SITE_OTHER): Payer: Medicaid Other | Admitting: Neurology

## 2023-01-30 VITALS — BP 135/90 | HR 65 | Resp 20 | Ht 64.0 in

## 2023-01-30 DIAGNOSIS — M5481 Occipital neuralgia: Secondary | ICD-10-CM | POA: Diagnosis not present

## 2023-01-30 MED ORDER — GABAPENTIN 100 MG PO CAPS: ORAL_CAPSULE | ORAL | 0 refills | Status: DC

## 2023-01-30 NOTE — Patient Instructions (Signed)
Start gabapentin 100mg  - increase dose as directed to goal of 3 pills twice daily.  If no improvement by time you need refill, contact me and we can increase dose. Follow up 5 months.    Occipital Neuralgia  Occipital neuralgia is a type of headache that causes brief episodes of very bad pain in the back of the head. Pain from occipital neuralgia may spread (radiate) to other parts of the head. These headaches may be caused by irritation of the nerves that leave the spinal cord high up in the neck, just below the base of the skull (occipital nerves). The occipital nerves transmit sensations from the back of the head, the top of the head, and the areas behind the ears. What are the causes? This condition can occur without any known cause (primary headache syndrome). In other cases, this condition is caused by pressure on or irritation of one of the two occipital nerves. Pressure and irritation may be due to: Muscle spasm in the neck. Neck injury. Wear and tear of the vertebrae in the neck (osteoarthritis). Disease of the disks that separate the vertebrae. Swollen blood vessels that put pressure on the occipital nerves. Infections. Tumors. Diabetes. What are the signs or symptoms? This condition causes brief burning, stabbing, electric, shocking, or shooting pain in the back of the head that can radiate to the top of the head. It can happen on one side or both sides of the head. It can also cause: Pain behind the eye. Pain triggered by neck movement or hair brushing. Scalp tenderness. Aching in the back of the head between episodes of very bad pain. Pain that gets worse with exposure to bright lights. How is this diagnosed? Your health care provider may diagnose the condition based on a physical exam and your symptoms. Tests may be done, such as: Imaging studies of the brain and neck (cervical spine), such as an MRI or CT scan. These look for causes of pinched nerves. Applying pressure to  the nerves in the neck to try to re-create the pain. Injection of numbing medicine into the occipital nerve areas to see if pain goes away (diagnostic nerve block). How is this treated? Treatment for this condition may begin with simple measures, such as: Rest. Massage. Applying heat or cold to the area. Over-the-counter pain relievers. If these measures do not work, you may need other treatments, including: Medicines, such as: Prescription-strength anti-inflammatory medicines. Muscle relaxants. Anti-seizure medicines, which can relieve pain. Antidepressants, which can relieve pain. Injected medicines, such as medicines that numb the area (local anesthetic) and steroids. Pulsed radiofrequency ablation. This is when wires are implanted to deliver electrical impulses that block pain signals from the occipital nerve. Surgery to relieve nerve pressure. Physical therapy. Follow these instructions at home: Managing pain     Avoid any activities that cause pain. Rest when you have an attack of pain. Try gentle massage to relieve pain. Try a different pillow or sleeping position. If directed, apply heat to the affected area as often as told by your health care provider. Use the heat source that your health care provider recommends, such as a moist heat pack or a heating pad. Place a towel between your skin and the heat source. Leave the heat on for 20-30 minutes. Remove the heat if your skin turns bright red. This is especially important if you are unable to feel pain, heat, or cold. You have a greater risk of getting burned. If directed, put ice on the back  of your head and neck area. To do this: Put ice in a plastic bag. Place a towel between your skin and the bag. Leave the ice on for 20 minutes, 2-3 times a day. Remove the ice if your skin turns bright red. This is very important. If you cannot feel pain, heat, or cold, you have a greater risk of damage to the area. General  instructions Take over-the-counter and prescription medicines only as told by your health care provider. Avoid things that make your symptoms worse, such as bright lights. Try to stay active. Get regular exercise that does not cause pain. Ask your health care provider to suggest safe exercises for you. Work with a physical therapist to learn stretching exercises you can do at home. Practice good posture. Keep all follow-up visits. This is important. Contact a health care provider if: Your medicine is not working. You have new or worsening symptoms. Get help right away if: You have very bad head pain that does not go away. You have a sudden change in vision, balance, or speech. These symptoms may represent a serious problem that is an emergency. Do not wait to see if the symptoms will go away. Get medical help right away. Call your local emergency services (911 in the [place] Do not drive yourself to the hospital. Summary Occipital neuralgia is a type of headache that causes brief episodes of very bad pain in the back of the head. Pain from occipital neuralgia may spread (radiate) to other parts of the head. Treatment for this condition includes rest, massage, and medicines. This information is not intended to replace advice given to you by your health care provider. Make sure you discuss any questions you have with your health care provider. Document Revised: 01/03/2020 Document Reviewed: 01/03/2020 Elsevier Patient Education  2024 [employer].

## 2023-03-21 ENCOUNTER — Ambulatory Visit
Admission: EM | Admit: 2023-03-21 | Discharge: 2023-03-21 | Disposition: A | Discharge status: Home / Self Care | Payer: Medicaid Other | Attending: Family Medicine | Admitting: Family Medicine

## 2023-03-21 DIAGNOSIS — J069 Acute upper respiratory infection, unspecified: Secondary | ICD-10-CM

## 2023-03-21 MED ORDER — PSEUDOEPH-BROMPHEN-DM 30-2-10 MG/5ML PO SYRP: 5.0000 mL | ORAL_SOLUTION | Freq: Four times a day (QID) | ORAL | 0 refills | Status: DC | PRN

## 2023-03-21 MED ORDER — FLUTICASONE PROPIONATE 50 MCG/ACT NA SUSP: 1.0000 | Freq: Two times a day (BID) | NASAL | 0 refills | Status: AC

## 2023-03-21 NOTE — ED Provider Notes (Signed)
 [facility]    CSN: [id number] Arrival date & time: 03/21/23  1440      History   Chief Complaint Chief Complaint  Patient presents with   Cough   Fatigue   Sore Throat    HPI Lindsay James is a 33 y.o. female.   Patient presenting today with about a week of slowly improving cough, fatigue, sore throat, body aches, fever, congestion.  Denies chest pain, shortness of breath, abdominal pain, vomiting, diarrhea.  Taking Tylenol  and DayQuil with mild temporary benefit.  No known history of chronic pulmonary disease.  Children all sick with similar symptoms.    Past Medical History:  Diagnosis Date   Acute appendicitis    Anemia    Chlamydia    Gonorrhea    High cholesterol    Hypertension    Migraine    UTI (urinary tract infection)     Patient Active Problem List   Diagnosis Date Noted   Nonspecific chest pain 10/09/2022   Abnormal uterine bleeding (AUB) 09/27/2022   Corn of foot 09/05/2022   Left anterior knee pain 07/23/2022   Class 3 severe obesity due to excess calories without serious comorbidity with body mass index (BMI) of 50.0 to 59.9 in adult (HCC) 06/28/2022   Chronic migraine without aura without status migrainosus, not intractable 06/28/2022   Physical exam, annual 06/28/2022   Acute pain of both knees 06/28/2022   B12 deficiency 01/30/2022   Alpha thalassemia silent carrier 09/18/2021    Past Surgical History:  Procedure Laterality Date   IRRIGATION AND DEBRIDEMENT ABSCESS Left 01/31/2022   Procedure: INCISION AND DRAINAGE OF LEFT THIGH ABSCESS;  Surgeon: Paola Dreama SAILOR, MD;  Location: [facility];  Service: General;  Laterality: Left;   LAPAROSCOPIC APPENDECTOMY N/A 01/18/2020   Procedure: APPENDECTOMY LAPAROSCOPIC;  Surgeon: Mavis Anes, MD;  Location: [facility];  Service: General;  Laterality: N/A;    OB History     Gravida  3   Para  2   Term  2   Preterm      AB  1   Living  2      SAB  1   IAB      Ectopic       Multiple  0   Live Births  2            Home Medications    Prior to Admission medications   Medication Sig Start Date End Date Taking? Authorizing Provider  brompheniramine-pseudoephedrine-DM 30-2-10 MG/5ML syrup Take 5 mLs by mouth 4 (four) times daily as needed. 03/21/23  Yes Stuart Vernell Norris, PA-C  gabapentin  (NEURONTIN ) 100 MG capsule Take 1 capsule twice daily for one week, then 2 capsules twice daily for one week, then increase to 3 capsules twice daily 01/30/23  Yes Jaffe, Adam R, DO  cetirizine  (ZYRTEC ) 10 MG chewable tablet Chew 10 mg by mouth daily.    [provider]  fluticasone  (FLONASE ) 50 MCG/ACT nasal spray Place 1 spray into both nostrils 2 (two) times daily. 03/21/23   Stuart Vernell Norris, PA-C  ibuprofen  (ADVIL ) 800 MG tablet Take 1 tablet (800 mg total) by mouth every 6 (six) hours as needed for moderate pain. 10/02/22   Haze Lonni PARAS, MD  meloxicam  (MOBIC ) 7.5 MG tablet Take 1 tablet (7.5 mg total) by mouth daily. 07/23/22   Kayla Jeoffrey RAMAN, FNP  Prenatal Vit-Fe Fumarate-FA (PRENATAL MULTIVITAMIN) TABS tablet Take 1 tablet by mouth daily at 12 noon. Patient not  taking: Reported on 01/30/2023 02/01/22   Izell Harari, MD  Rimegepant Sulfate (NURTEC) 75 MG TBDP Take 1 tablet (75 mg total) by mouth daily as needed (headache). Patient not taking: Reported on 01/30/2023 10/09/22   Kayla Jeoffrey RAMAN, FNP  promethazine  (PHENERGAN ) 25 MG tablet Take 1 tablet (25 mg total) by mouth every 6 (six) hours as needed for nausea or vomiting. 07/22/19 10/28/19  Pollina, Lonni PARAS, MD    Family History Family History  Problem Relation Age of Onset   Diabetes Maternal Grandmother    Cancer Father    Asthma Mother    Arthritis/Rheumatoid Mother    Kidney Stones Mother     Social History Social History   Tobacco Use   Smoking status: Former    Current packs/day: 0.00    Types: Cigarettes, Cigars    Quit date: 06/2021    Years since quitting: 1.7    Smokeless tobacco: Never   Tobacco comments:    1 black & mild daily.  stopped cigarettes  nov 2021  Vaping Use   Vaping status: Never Used  Substance Use Topics   Alcohol use: Not Currently    Comment: socially   Drug use: Not Currently    Types: Marijuana    Comment: April 2023     Allergies   Peanut-containing drug products   Review of Systems Review of Systems Per HPI  Physical Exam Triage Vital Signs ED Triage Vitals [03/21/23 1530]  Encounter Vitals Group     BP 121/84     Systolic BP Percentile      Diastolic BP Percentile      Pulse Rate (!) 111     Resp 16     Temp 98.6 F (37 C)     Temp Source Oral     SpO2 95 %     Weight      Height      Head Circumference      Peak Flow      Pain Score      Pain Loc      Pain Education      Exclude from Growth Chart    No data found.  Updated Vital Signs BP 121/84 (BP Location: Left Arm)   Pulse (!) 111   Temp 98.6 F (37 C) (Oral)   Resp 16   LMP 03/08/2023 (Approximate)   SpO2 95%   Breastfeeding No   Visual Acuity Right Eye Distance:   Left Eye Distance:   Bilateral Distance:    Right Eye Near:   Left Eye Near:    Bilateral Near:     Physical Exam Vitals and nursing note reviewed.  Constitutional:      Appearance: Normal appearance.  HENT:     Head: Atraumatic.     Right Ear: Tympanic membrane and external ear normal.     Left Ear: Tympanic membrane and external ear normal.     Nose: Rhinorrhea present.     Mouth/Throat:     Mouth: Mucous membranes are moist.     Pharynx: Posterior oropharyngeal erythema present.  Eyes:     Extraocular Movements: Extraocular movements intact.     Conjunctiva/sclera: Conjunctivae normal.  Cardiovascular:     Rate and Rhythm: Normal rate and regular rhythm.     Heart sounds: Normal heart sounds.  Pulmonary:     Effort: Pulmonary effort is normal.     Breath sounds: Normal breath sounds. No wheezing or rales.  Musculoskeletal:  General: Normal  range of motion.     Cervical back: Normal range of motion and neck supple.  Skin:    General: Skin is warm and dry.  Neurological:     Mental Status: She is alert and oriented to person, place, and time.  Psychiatric:        Mood and Affect: Mood normal.        Thought Content: Thought content normal.      UC Treatments / Results  Labs (all labs ordered are listed, but only abnormal results are displayed) Labs Reviewed - No data to display  EKG   Radiology No results found.  Procedures Procedures (including critical care time)  Medications Ordered in UC Medications - No data to display  Initial Impression / Assessment and Plan / UC Course  I have reviewed the triage vital signs and the nursing notes.  Pertinent labs & imaging results that were available during my care of the patient were reviewed by me and considered in my medical decision making (see chart for details).     Vitals and exam overall reassuring, suspect viral respiratory infection.  Per patient symptoms are slowly improving.  Discussed Flonase , Phenergan  DM, supportive over-the-counter medications and home care.  Return for worsening symptoms.  Final Clinical Impressions(s) / UC Diagnoses   Final diagnoses:  Viral URI with cough   Discharge Instructions   None    ED Prescriptions     Medication Sig Dispense Auth. Provider   fluticasone  (FLONASE ) 50 MCG/ACT nasal spray Place 1 spray into both nostrils 2 (two) times daily. 16 g Stuart Vernell Norris, PA-C   brompheniramine-pseudoephedrine-DM 30-2-10 MG/5ML syrup Take 5 mLs by mouth 4 (four) times daily as needed. 120 mL Stuart Vernell Norris, [place]      PDMP not reviewed this encounter.   Stuart Vernell Norris, [place] 03/21/23 1626

## 2023-03-21 NOTE — ED Triage Notes (Signed)
 Patient presents to the office for cough,fatigue, and sore throat x 1 week. Patient has not taken any medication to help with symptoms.

## 2023-04-16 ENCOUNTER — Encounter (INDEPENDENT_AMBULATORY_CARE_PROVIDER_SITE_OTHER): Payer: Self-pay

## 2023-05-01 ENCOUNTER — Encounter (INDEPENDENT_AMBULATORY_CARE_PROVIDER_SITE_OTHER): Payer: Self-pay
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
## 2023-07-09 ENCOUNTER — Encounter: Payer: Self-pay | Admitting: Neurology

## 2023-07-09 ENCOUNTER — Ambulatory Visit (INDEPENDENT_AMBULATORY_CARE_PROVIDER_SITE_OTHER): Payer: Medicaid Other | Admitting: Neurology

## 2023-07-09 VITALS — BP 117/77 | HR 77 | Ht 64.0 in | Wt 334.6 lb

## 2023-07-09 DIAGNOSIS — E348 Other specified endocrine disorders: Secondary | ICD-10-CM

## 2023-07-09 DIAGNOSIS — M5481 Occipital neuralgia: Secondary | ICD-10-CM | POA: Diagnosis not present

## 2023-07-09 MED ORDER — PREGABALIN 75 MG PO CAPS: 75.0000 mg | ORAL_CAPSULE | Freq: Two times a day (BID) | ORAL | 5 refills | Status: DC

## 2023-07-09 NOTE — Patient Instructions (Signed)
 Stop gabapentin .  Start pregablin (Lyrica ) 75mg  twice daily.  We can increase dose in 2 months if needed Repeat MRI of brain with and without contrast in 6 months Follow up with me in 6 months (at least 2 weeks after repeat MRI)

## 2023-07-29 ENCOUNTER — Emergency Department (HOSPITAL_COMMUNITY)

## 2023-07-29 ENCOUNTER — Encounter (HOSPITAL_COMMUNITY): Payer: Self-pay

## 2023-07-29 ENCOUNTER — Emergency Department (HOSPITAL_COMMUNITY)
Admission: EM | Admit: 2023-07-29 | Discharge: 2023-07-29 | Disposition: A | Discharge status: Home / Self Care | Attending: Emergency Medicine | Admitting: Emergency Medicine

## 2023-07-29 ENCOUNTER — Other Ambulatory Visit: Payer: Self-pay

## 2023-07-29 DIAGNOSIS — M545 Low back pain, unspecified: Secondary | ICD-10-CM | POA: Insufficient documentation

## 2023-07-29 DIAGNOSIS — Z91010 Allergy to peanuts: Secondary | ICD-10-CM | POA: Diagnosis not present

## 2023-07-29 DIAGNOSIS — M25551 Pain in right hip: Secondary | ICD-10-CM | POA: Diagnosis present

## 2023-07-29 DIAGNOSIS — Z3202 Encounter for pregnancy test, result negative: Secondary | ICD-10-CM | POA: Diagnosis not present

## 2023-07-29 LAB — URINALYSIS, ROUTINE W REFLEX MICROSCOPIC
Bilirubin Urine: NEGATIVE
Glucose, UA: NEGATIVE mg/dL
Hgb urine dipstick: NEGATIVE
Ketones, ur: NEGATIVE mg/dL
Leukocytes,Ua: NEGATIVE
Nitrite: NEGATIVE
Protein, ur: NEGATIVE mg/dL
Specific Gravity, Urine: 1.016 (ref 1.005–1.030)
pH: 6 (ref 5.0–8.0)

## 2023-07-29 LAB — PREGNANCY, URINE: Preg Test, Ur: NEGATIVE

## 2023-07-29 MED ORDER — NAPROXEN 250 MG PO TABS
500.0000 mg | ORAL_TABLET | Freq: Once | ORAL | Status: AC
  Administered 2023-07-29: 500 mg via ORAL
  Filled 2023-07-29: qty 2

## 2023-07-29 MED ORDER — NAPROXEN 500 MG PO TABS: 500.0000 mg | ORAL_TABLET | Freq: Two times a day (BID) | ORAL | 0 refills | Status: AC

## 2023-07-29 MED ORDER — METHOCARBAMOL 500 MG PO TABS: 500.0000 mg | ORAL_TABLET | Freq: Four times a day (QID) | ORAL | 0 refills | Status: AC | PRN

## 2023-07-29 NOTE — ED Triage Notes (Signed)
 Pt arrived via POV c/o right hip pain X 4 days. Pt reports she does a lot of heavy lifting at work and is on her feet a lot, and Pt unsure if she may have injured herself. Pt reports pain shoots down her leg.

## 2023-07-29 NOTE — Discharge Instructions (Signed)
 You are seen today for low back pain.  You had some mild degenerative changes on your MRI but no bulging or herniated disc or other significant findings.  We are treating you with anti-inflammatories and muscle relaxers.  He can do gentle stretching.  Follow-up with your PCP, you may benefit from physical therapy as well.  Avoid heavy lifting until your symptoms have improved.  Come back to the ER for new or worsening symptoms.

## 2023-07-29 NOTE — ED Provider Notes (Signed)
 [place] EMERGENCY DEPARTMENT AT [facility] Provider Note   CSN: [id number] Arrival date & time: 07/29/23  1603     History  Chief Complaint  Patient presents with   Hip Pain    Lindsay James is a 33 y.o. female.  She presents today for evaluation of right hip pain x 4 days, thinks it could be due to the heavy lifting she does at work.  Denies any definite trauma, pain radiates down her right leg.  Hip Pain       Home Medications Prior to Admission medications   Medication Sig Start Date End Date Taking? Authorizing Provider  brompheniramine-pseudoephedrine-DM 30-2-10 MG/5ML syrup Take 5 mLs by mouth 4 (four) times daily as needed. 03/21/23   Corbin Dess, PA-C  cetirizine  (ZYRTEC ) 10 MG chewable tablet Chew 10 mg by mouth daily. Patient not taking: Reported on 07/09/2023    [provider]  fluticasone  (FLONASE ) 50 MCG/ACT nasal spray Place 1 spray into both nostrils 2 (two) times daily. 03/21/23   Corbin Dess, PA-C  ibuprofen  (ADVIL ) 800 MG tablet Take 1 tablet (800 mg total) by mouth every 6 (six) hours as needed for moderate pain. Patient not taking: Reported on 07/09/2023 10/02/22   Ballard Bongo, MD  pregabalin  (LYRICA ) 75 MG capsule Take 1 capsule (75 mg total) by mouth 2 (two) times daily. 07/09/23   Merriam Abbey, DO  Rimegepant Sulfate (NURTEC) 75 MG TBDP Take 1 tablet (75 mg total) by mouth daily as needed (headache). Patient not taking: Reported on 07/09/2023 10/09/22   Jenelle Mis, FNP  promethazine  (PHENERGAN ) 25 MG tablet Take 1 tablet (25 mg total) by mouth every 6 (six) hours as needed for nausea or vomiting. 07/22/19 10/28/19  Ballard Bongo, MD      Allergies    Peanut-containing drug products    Review of Systems   Review of Systems  Physical Exam Updated Vital Signs BP (!) 118/52 (BP Location: Right Arm)   Pulse 93   Temp 98.3 F (36.8 C) (Oral)   Resp 18   Ht 5\' 4"  (1.626 m)   Wt (!)  150.6 kg   SpO2 100%   BMI 56.99 kg/m  Physical Exam Vitals and nursing note reviewed.  Constitutional:      General: She is not in acute distress.    Appearance: She is well-developed.  HENT:     Head: Normocephalic and atraumatic.  Eyes:     Conjunctiva/sclera: Conjunctivae normal.  Cardiovascular:     Rate and Rhythm: Normal rate and regular rhythm.     Heart sounds: No murmur heard. Pulmonary:     Effort: Pulmonary effort is normal. No respiratory distress.     Breath sounds: Normal breath sounds.  Abdominal:     Palpations: Abdomen is soft.     Tenderness: There is no abdominal tenderness.  Musculoskeletal:        General: No swelling.     Cervical back: Neck supple.     Comments: Tenderness right lumbar paraspinous muscles, no swelling or induration of this area.  No overlying erythema.  Normal range of motion of right hip knee and ankle.  Intact pulses in right foot.  Patient can bear weight and ambulate.  Skin:    General: Skin is warm and dry.     Capillary Refill: Capillary refill takes less than 2 seconds.  Neurological:     General: No focal deficit present.     Mental  Status: She is alert.  Psychiatric:        Mood and Affect: Mood normal.     ED Results / Procedures / Treatments   Labs (all labs ordered are listed, but only abnormal results are displayed) Labs Reviewed  URINALYSIS, ROUTINE W REFLEX MICROSCOPIC  PREGNANCY, URINE    EKG None  Radiology MR Lumbar Spine Wo Contrast Result Date: 07/29/2023 CLINICAL DATA:  Low back pain, cauda equina syndrome suspected EXAM: MRI LUMBAR SPINE WITHOUT CONTRAST TECHNIQUE: Multiplanar, multisequence MR imaging of the lumbar spine was performed. No intravenous contrast was administered. COMPARISON:  Lumbar spine radiographs 10/10/2017. FINDINGS: Segmentation: Conventional anatomy assumed, with the last open disc space designated L5-S1.Concordant with prior radiographs. Alignment:  Physiologic. Vertebrae: No  worrisome osseous lesion, acute fracture or pars defect. Diffusely decreased T1 marrow signal without focally suspicious abnormality on inversion recovery imaging. Sacroiliac degenerative changes bilaterally. Conus medullaris: Extends to the L1-2 level. The conus and cauda equina appear normal. Paraspinal and other soft tissues: No significant paraspinal findings. Nonspecific dependent subcutaneous edema in the back. Disc levels: There is mild disc desiccation at L5-S1. The additional lumbar discs are well hydrated with maintained height. No evidence of disc herniation, spinal stenosis or foraminal narrowing. Mild facet hypertrophy bilaterally at L3-4, L4-5 and L5-S1. IMPRESSION: 1. No acute findings or explanation for the patient's symptoms. 2. Mild lower lumbar facet hypertrophy. No disc herniation, spinal stenosis or foraminal narrowing. 3. Diffusely decreased T1 marrow signal, nonspecific but can be seen in the setting of anemia, smoking, obesity and other hematologic disorders. Electronically Signed   By: Elmon Hagedorn M.D.   On: 07/29/2023 19:22   DG Hip Unilat With Pelvis 2-3 Views Right Result Date: 07/29/2023 CLINICAL DATA:  Right hip pain x4 days. EXAM: DG HIP (WITH OR WITHOUT PELVIS) 2-3V RIGHT COMPARISON:  None Available. FINDINGS: There is no evidence of hip fracture or dislocation. There is no evidence of arthropathy or other focal bone abnormality. IMPRESSION: Negative. Electronically Signed   By: Virgle Grime M.D.   On: 07/29/2023 19:06    Procedures Procedures    Medications Ordered in ED Medications  naproxen  (NAPROSYN ) tablet 500 mg (500 mg Oral Given 07/29/23 1753)    ED Course/ Medical Decision Making/ A&P                                 Medical Decision Making This patient presents to the ED for concern of right hip pain x 4 days, , this involves an extensive number of treatment options, and is a complaint that carries with it a high risk of complications and  morbidity.  The differential diagnosis includes sprain, arthritis, HNP, muscle strain, other   Co morbidities that complicate the patient evaluation  Obesity, migraines   Additional history obtained:  Additional history obtained from EMR External records from outside source obtained and reviewed including prior notes   Lab Tests:  I Ordered, and personally interpreted labs.  The pertinent results include: UA negative, pregnancy test negative   Imaging Studies ordered:  I ordered imaging studies including x-ray right hip I independently visualized and interpreted imaging which showed no fracture, no dislocation, no noted changes I agree with the radiologist interpretation  MRI L spine ordered, interpreted by radiology as no acute findings to explain pain, mild lower lumbar facet hypertrophy   Problem List / ED Course / Critical interventions / Medication management  Right low back  pain with radiculopathy-MRI showed some mild degenerative changes no significant findings however.  X-ray right hip was normal.  Discussed with patient she does have some tenderness of the musculature and does a lot of heavy lifting will treat with NSAIDs and muscle relaxers and have her follow-up close with PCP.  Given strict return precautions.  She had no fever, no chills, no weight loss, does not use IV drugs.  I do not have suspicion for spinal epidural abscess or other infectious cause.  Do not feel she needs further labs or workup at this time. I ordered medication including Naproxen  for pain Reevaluation of the patient after these medicines showed that the patient improved I have reviewed the patients home medicines and have made adjustments as needed        Amount and/or Complexity of Data Reviewed Labs: ordered. Radiology: ordered.  Risk Prescription drug management.           Final Clinical Impression(s) / ED Diagnoses Final diagnoses:  None    Rx / DC Orders ED  Discharge Orders     None         Aimee Houseman, PA-C 07/29/23 1941    Merdis Stalling, MD 07/29/23 2246

## 2023-08-19 ENCOUNTER — Encounter: Payer: Self-pay | Admitting: Neurology

## 2023-08-19 ENCOUNTER — Other Ambulatory Visit: Payer: Self-pay | Admitting: Neurology

## 2023-08-19 MED ORDER — PREGABALIN 75 MG PO CAPS: 75.0000 mg | ORAL_CAPSULE | Freq: Two times a day (BID) | ORAL | 5 refills | Status: DC

## 2023-09-19 ENCOUNTER — Encounter: Payer: Self-pay | Admitting: Family Medicine

## 2023-09-19 ENCOUNTER — Ambulatory Visit: Admitting: Family Medicine

## 2023-09-19 VITALS — BP 121/80 | HR 69 | Temp 98.0°F | Ht 64.0 in | Wt 334.6 lb

## 2023-09-19 DIAGNOSIS — E66813 Obesity, class 3: Secondary | ICD-10-CM

## 2023-09-19 DIAGNOSIS — F329 Major depressive disorder, single episode, unspecified: Secondary | ICD-10-CM | POA: Diagnosis not present

## 2023-09-19 DIAGNOSIS — H547 Unspecified visual loss: Secondary | ICD-10-CM

## 2023-09-19 DIAGNOSIS — M25571 Pain in right ankle and joints of right foot: Secondary | ICD-10-CM | POA: Diagnosis not present

## 2023-09-19 DIAGNOSIS — F17210 Nicotine dependence, cigarettes, uncomplicated: Secondary | ICD-10-CM | POA: Diagnosis not present

## 2023-09-19 DIAGNOSIS — Z6841 Body Mass Index (BMI) 40.0 and over, adult: Secondary | ICD-10-CM

## 2023-09-19 DIAGNOSIS — Z23 Encounter for immunization: Secondary | ICD-10-CM | POA: Diagnosis not present

## 2023-09-19 DIAGNOSIS — G43709 Chronic migraine without aura, not intractable, without status migrainosus: Secondary | ICD-10-CM

## 2023-09-19 DIAGNOSIS — Z0000 Encounter for general adult medical examination without abnormal findings: Secondary | ICD-10-CM | POA: Diagnosis not present

## 2023-09-19 MED ORDER — BUPROPION HCL ER (SR) 150 MG PO TB12: 150.0000 mg | ORAL_TABLET | Freq: Two times a day (BID) | ORAL | 1 refills | Status: AC

## 2023-09-19 MED ORDER — ZEPBOUND 2.5 MG/0.5ML ~~LOC~~ SOAJ: SUBCUTANEOUS | 0 refills | Status: DC

## 2023-09-19 NOTE — Progress Notes (Signed)
 Patient is in office today for a nurse visit for Immunization of Prevnar 20. Patient Injection was given in the  Left deltoid. Patient tolerated injection well.

## 2023-09-19 NOTE — Assessment & Plan Note (Signed)
 3-5 minute discussion regarding the harms of tobacco use, the benefits of cessation, and methods of cessation. Discussed that there are medication options to help with cessation. Provided printed education on steps to quit smoking. Patient is willing to try a medication to help. Start Wellbutrin SR 150mg  BID, once daily for first 3 days.

## 2023-09-19 NOTE — Progress Notes (Addendum)
 Subjective:  HPI: Lindsay James is a 33 y.o. female presenting on 09/19/2023 for Acute Visit (Muscle Aches in hip and ankle pain )   HPI Patient is in today for right ankle pain for 1 week. She is concerned she injured it at work by stepping wrong but does not recall any specific injury. She is able to bear weight. The pain is worse when standing for long periods. Overall the pain is improving since last week. Denies swelling, redness, warmth, numbness, trauma or injury. Has tried Naproxen  and Aleve  with relief in addition to ACE wrap. Lindsay James is also overdue for her physical and would like this today. Other concerns include smoking cessation, weight management, and depression and anxiety since the passing of her father and recent increase in stress at work. She does see a dentist regularly. Would like referral to optometry for eyeglasses prescription update.   Obesity: has tried diet and exercise without success, exercise is limited by pain in her ankles, knees, hips, this is likely confounded by her weight. She is working on reduced calorie diet.   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   HENT: Negative.    Eyes: Negative.   Respiratory: Negative.    Cardiovascular: Negative.   Gastrointestinal: Negative.   Genitourinary: Negative.   Musculoskeletal:  Positive for joint pain.  Skin: Negative.   Neurological:  Positive for headaches.  Endo/Heme/Allergies: Negative.   Psychiatric/Behavioral:  Positive for depression. Negative for suicidal ideas.   All other systems  reviewed and are negative.     09/19/2023    9:58 AM 10/09/2022   12:27 PM 09/27/2022   10:41 AM 07/23/2022   11:36 AM  GAD 7 : Generalized Anxiety Score  Nervous, Anxious, on Edge 2 2 2 1   Control/stop worrying 3 2 3 2   Worry too much - different things 3 2 2 2   Trouble relaxing 2 1 2 2   Restless 1 1  2   Easily annoyed or irritable 2 2 2 2   Afraid - awful might happen 2 1 1 2   Total GAD 7 Score 15 11  13   Anxiety Difficulty Somewhat difficult  Somewhat difficult Somewhat difficult       09/19/2023    9:58 AM 10/09/2022   12:27 PM 09/27/2022   10:41 AM 07/23/2022   11:36 AM 06/28/2022   11:11 AM  Depression screen PHQ 2/9  Decreased Interest 2 1 1 2 3   Down, Depressed, Hopeless 2 1 1 2 3   PHQ - 2 Score 4 2 2 4 6   Altered sleeping 1 3 2 2 3   Tired, decreased energy 2 2 2 2 3   Change in appetite 2 2 2 2 3   Feeling bad or failure about yourself  0 0 1 2 3   Trouble concentrating 1 1 2 1 3   Moving slowly or fidgety/restless 0 1 2 1 3   Suicidal thoughts  0 0 0 3  PHQ-9 Score 10 11 13 14 27   Difficult doing work/chores Somewhat difficult Somewhat difficult Somewhat difficult Somewhat difficult Extremely dIfficult     Relevant past medical  history reviewed and updated as indicated.   Past Medical History:  Diagnosis Date   Acute appendicitis    Allergy 2016   After my first child it got worst   Anemia    Chlamydia    GERD (gastroesophageal reflux disease)    I think when i was younger  but i dont take medicine for it right now   Gonorrhea    High cholesterol    Hypertension    Migraine    UTI (urinary tract infection)      Past Surgical History:  Procedure Laterality Date   IRRIGATION AND DEBRIDEMENT ABSCESS Left 01/31/2022   Procedure: INCISION AND DRAINAGE OF LEFT THIGH ABSCESS;  Surgeon: Lindsay Dreama SAILOR, MD;  Location: [facility] OR;  Service: General;  Laterality: Left;   LAPAROSCOPIC APPENDECTOMY N/A 01/18/2020   Procedure: APPENDECTOMY LAPAROSCOPIC;  Surgeon: Lindsay Anes,  MD;  Location: [facility];  Service: General;  Laterality: N/A;    Allergies and medications reviewed and updated.   Current Outpatient Medications:    buPROPion  (WELLBUTRIN  SR) 150 MG 12 hr tablet, Take 1 tablet (150 mg total) by mouth 2 (two) times daily. Take 1 tablet once daily for first 3 days, Disp: 120 tablet, Rfl: 1   fluticasone  (FLONASE ) 50 MCG/ACT nasal spray, Place 1 spray into both nostrils 2 (two) times daily., Disp: 16 g, Rfl: 0   methocarbamol  (ROBAXIN ) 500 MG tablet, Take 1 tablet (500 mg total) by mouth every 6 (six) hours as needed for muscle spasms., Disp: 20 tablet, Rfl: 0   naproxen  (NAPROSYN ) 500 MG tablet, Take 1 tablet (500 mg total) by mouth 2 (two) times daily., Disp: 30 tablet, Rfl: 0   pregabalin  (LYRICA ) 75 MG capsule, Take 1 capsule (75 mg total) by mouth 2 (two) times daily., Disp: 60 capsule, Rfl: 5   WEGOVY  0.25 MG/0.5ML SOAJ, Inject 0.25 mg into the skin once a week for 28 days, THEN 0.5 mg once a week for 28 days. Use this dose for 1 month (4 shots) and then increase to next higher dose., Disp: 2 mL, Rfl: 0  Allergies  Allergen Reactions   Peanut-Containing Drug Products Rash    Objective:   BP 121/80   Pulse 69   Temp 98 F (36.7 C)   Ht 5' 4 (1.626 m)   Wt (!) 334 lb 9.6 oz (151.8 kg)   LMP 08/25/2023   SpO2 99%   Breastfeeding No   BMI 57.43 kg/m      09/19/2023    9:06 AM 07/29/2023    7:49 PM 07/29/2023    7:48 PM  Vitals with BMI  Height 5' 4    Weight 334 lbs 10 oz    BMI 57.41    Systolic 121  110  Diastolic 80  47  Pulse 69 73      Physical Exam Vitals and nursing note reviewed.  Constitutional:      Appearance: Normal appearance. She is obese.  HENT:     Head: Normocephalic and atraumatic.     Right Ear: Tympanic membrane, ear canal and external ear normal.     Left Ear: Tympanic membrane, ear canal and external ear normal.     Nose: Nose normal.     Mouth/Throat:     Mouth: Mucous membranes are moist.     Pharynx:  Oropharynx is clear.  Eyes:     Extraocular Movements: Extraocular movements intact.     Conjunctiva/sclera: Conjunctivae normal.     Pupils: Pupils  are equal, round, and reactive to light.  Cardiovascular:     Rate and Rhythm: Normal rate and regular rhythm.     Pulses: Normal pulses.     Heart sounds: Normal heart sounds.  Pulmonary:     Effort: Pulmonary effort is normal.     Breath sounds: Normal breath sounds.  Abdominal:     General: Bowel sounds are normal.     Palpations: Abdomen is soft.  Musculoskeletal:        General: Normal range of motion.     Cervical back: Normal range of motion and neck supple.     Right ankle: Normal.     Left ankle: Normal.     Right foot: Normal. Normal range of motion and normal capillary refill. No swelling, bony tenderness or crepitus. Normal pulse.     Left foot: Normal.  Skin:    General: Skin is warm and dry.     Capillary Refill: Capillary refill takes less than 2 seconds.  Neurological:     General: No focal deficit present.     Mental Status: She is alert and oriented to person, place, and time. Mental status is at baseline.  Psychiatric:        Mood and Affect: Mood normal. Affect is tearful.        Behavior: Behavior normal.        Thought Content: Thought content normal.        Judgment: Judgment normal.     Assessment & Plan:  Physical exam, annual Assessment & Plan: Today your medical history was reviewed and routine physical exam with labs was performed. Recommend 150 minutes of moderate intensity exercise weekly and consuming a well-balanced diet. Advised to stop smoking if a smoker, avoid smoking if a non-smoker, limit alcohol consumption to 1 drink per day for women and 2 drinks per day for men, and avoid illicit drug use. Counseled on safe sex practices and offered STI testing today. Counseled on the importance of sunscreen use. Counseled in mental health awareness and when to seek medical care. Vaccine maintenance  discussed. Appropriate health maintenance items reviewed. Return to office in 1 year for annual physical exam.   Orders: -     CBC with Differential/Platelet -     Comprehensive metabolic panel with GFR -     Lipid panel -     Hemoglobin A1c -     VITAMIN D  25 Hydroxy (Vit-D Deficiency, Fractures)  Chronic migraine without aura without status migrainosus, not intractable Assessment & Plan: Followed by neurology. Serial MRI for 7mm pineal cyst. Managed with Lyrica  75mg  BID.    Class 3 severe obesity due to excess calories without serious comorbidity with body mass index (BMI) of 50.0 to 59.9 in adult -     CBC with Differential/Platelet -     Comprehensive metabolic panel with GFR -     Lipid panel -     Hemoglobin A1c -     VITAMIN D  25 Hydroxy (Vit-D Deficiency, Fractures)  Morbid obesity (HCC) Assessment & Plan: Counseled on importance of weight management for overall health. Encouraged low calorie, heart healthy diet and moderate intensity exercise 150 minutes weekly. This is 3-5 times weekly for 30-50 minutes each session. Goal should be pace of 3 miles/hours, or walking 1.5 miles in 30 minutes and include strength training. Discussed risks of obesity. Start Zepbound  2.5mg  weekly and increase as tolerated, discussed risks vs benefits. No history of pancreatitis and personal or family history of  MEN2 or MTC. Counseled on importance of diet and exercise, exercise is limited by orthopedic pain, encouraged high protein diet with appropriate caloric intake.  Orders: -     CBC with Differential/Platelet -     Comprehensive metabolic panel with GFR -     Lipid panel -     Hemoglobin A1c -     VITAMIN D  25 Hydroxy (Vit-D Deficiency, Fractures)  Reactive depression Assessment & Plan: Start Wellbutrin  SR 150mg  BID and refer to psychiatry. Follow up in 4 weeks or sooner if needed. Denies SI/HI. GAD 15, PHQ 10  Orders: -     Ambulatory referral to Psychiatry  Vision  impairment Assessment & Plan: Refer to ophthalmology for evaluation  Orders: -     Ambulatory referral to Ophthalmology  Cigarette nicotine dependence without complication Assessment & Plan: 3-5 minute discussion regarding the harms of tobacco use, the benefits of cessation, and methods of cessation. Discussed that there are medication options to help with cessation. Provided printed education on steps to quit smoking. Patient is willing to try a medication to help. Start Wellbutrin  SR 150mg  BID, once daily for first 3 days.    Encounter for immunization -     Pneumococcal conjugate vaccine 20-valent  Acute right ankle pain Assessment & Plan: Continue NSAIDs, rest, ice, elevation. Follow up PRN   Other orders -     buPROPion  HCl ER (SR); Take 1 tablet (150 mg total) by mouth 2 (two) times daily. Take 1 tablet once daily for first 3 days  Dispense: 120 tablet; Refill: 1     Follow up plan: Return in about 4 weeks (around 10/17/2023) for follow-up, anxiety/depression, weight management.  Jeoffrey GORMAN Barrio, FNP

## 2023-09-19 NOTE — Assessment & Plan Note (Deleted)
 Counseled on importance of weight management for overall health. Encouraged low calorie, heart healthy diet and moderate intensity exercise 150 minutes weekly. This is 3-5 times weekly for 30-50 minutes each session. Goal should be pace of 3 miles/hours, or walking 1.5 miles in 30 minutes and include strength training. Discussed risks of obesity. Start Zepbound 2.5mg  weekly and increase as tolerated. No history of pancreatitis and personal or family history of MEN2 or MTC Follow up in 4 weeks

## 2023-09-19 NOTE — Assessment & Plan Note (Signed)
 Counseled on importance of weight management for overall health. Encouraged low calorie, heart healthy diet and moderate intensity exercise 150 minutes weekly. This is 3-5 times weekly for 30-50 minutes each session. Goal should be pace of 3 miles/hours, or walking 1.5 miles in 30 minutes and include strength training. Discussed risks of obesity. Start Zepbound 2.5mg  weekly and increase as tolerated, discussed risks vs benefits. No history of pancreatitis and personal or family history of MEN2 or MTC. Counseled on importance of diet and exercise, exercise is limited by orthopedic pain, encouraged high protein diet with appropriate caloric intake.

## 2023-09-19 NOTE — Assessment & Plan Note (Signed)

## 2023-09-19 NOTE — Assessment & Plan Note (Signed)
 Followed by neurology. Serial MRI for 7mm pineal cyst. Managed with Lyrica  75mg  BID.

## 2023-09-19 NOTE — Assessment & Plan Note (Addendum)
Refer to ophthalmology for evaluation

## 2023-09-19 NOTE — Assessment & Plan Note (Signed)
 Start Wellbutrin SR 150mg  BID and refer to psychiatry. Follow up in 4 weeks or sooner if needed. Denies SI/HI. GAD 15, PHQ 10

## 2023-09-20 LAB — CBC WITH DIFFERENTIAL/PLATELET
Absolute Lymphocytes: 3021 {cells}/uL (ref 850–3900)
Absolute Monocytes: 819 {cells}/uL (ref 200–950)
Basophils Absolute: 26 {cells}/uL (ref 0–200)
Basophils Relative: 0.2 %
Eosinophils Absolute: 435 {cells}/uL (ref 15–500)
Eosinophils Relative: 3.4 %
HCT: 41.4 % (ref 35.0–45.0)
Hemoglobin: 12.2 g/dL (ref 11.7–15.5)
MCH: 23.2 pg — ABNORMAL LOW (ref 27.0–33.0)
MCHC: 29.5 g/dL — ABNORMAL LOW (ref 32.0–36.0)
MCV: 78.7 fL — ABNORMAL LOW (ref 80.0–100.0)
MPV: 10.2 fL (ref 7.5–12.5)
Monocytes Relative: 6.4 %
Neutro Abs: 8499 {cells}/uL — ABNORMAL HIGH (ref 1500–7800)
Neutrophils Relative %: 66.4 %
Platelets: 264 Thousand/uL (ref 140–400)
RBC: 5.26 Million/uL — ABNORMAL HIGH (ref 3.80–5.10)
RDW: 15.5 % — ABNORMAL HIGH (ref 11.0–15.0)
Total Lymphocyte: 23.6 %
WBC: 12.8 Thousand/uL — ABNORMAL HIGH (ref 3.8–10.8)

## 2023-09-20 LAB — COMPREHENSIVE METABOLIC PANEL WITH GFR
AG Ratio: 1.5 (calc) (ref 1.0–2.5)
ALT: 16 U/L (ref 6–29)
AST: 16 U/L (ref 10–30)
Albumin: 3.7 g/dL (ref 3.6–5.1)
Alkaline phosphatase (APISO): 82 U/L (ref 31–125)
BUN/Creatinine Ratio: 13 (calc) (ref 6–22)
BUN: 15 mg/dL (ref 7–25)
CO2: 25 mmol/L (ref 20–32)
Calcium: 9 mg/dL (ref 8.6–10.2)
Chloride: 104 mmol/L (ref 98–110)
Creat: 1.16 mg/dL — ABNORMAL HIGH (ref 0.50–0.97)
Globulin: 2.5 g/dL (ref 1.9–3.7)
Glucose, Bld: 69 mg/dL (ref 65–99)
Potassium: 4 mmol/L (ref 3.5–5.3)
Sodium: 139 mmol/L (ref 135–146)
Total Bilirubin: 0.3 mg/dL (ref 0.2–1.2)
Total Protein: 6.2 g/dL (ref 6.1–8.1)
eGFR: 65 mL/min/1.73m2 (ref >=60)

## 2023-09-20 LAB — LIPID PANEL
Cholesterol: 157 mg/dL (ref <200)
HDL: 39 mg/dL — ABNORMAL LOW (ref >=50)
LDL Cholesterol (Calc): 99 mg/dL
Non-HDL Cholesterol (Calc): 118 mg/dL (ref <130)
Total CHOL/HDL Ratio: 4 (calc) (ref <5.0)
Triglycerides: 101 mg/dL (ref <150)

## 2023-09-20 LAB — HEMOGLOBIN A1C
Hgb A1c MFr Bld: 5.5 % (ref <5.7)
Mean Plasma Glucose: 111 mg/dL
eAG (mmol/L): 6.2 mmol/L

## 2023-09-20 LAB — VITAMIN D 25 HYDROXY (VIT D DEFICIENCY, FRACTURES): Vit D, 25-Hydroxy: 24 ng/mL — ABNORMAL LOW (ref 30–100)

## 2023-09-23 ENCOUNTER — Other Ambulatory Visit (HOSPITAL_COMMUNITY): Payer: Self-pay

## 2023-09-23 ENCOUNTER — Telehealth: Payer: Self-pay | Admitting: Pharmacy Technician

## 2023-09-23 ENCOUNTER — Ambulatory Visit: Payer: Self-pay | Admitting: Family Medicine

## 2023-09-23 ENCOUNTER — Other Ambulatory Visit: Payer: Self-pay | Admitting: Family Medicine

## 2023-09-23 DIAGNOSIS — D72829 Elevated white blood cell count, unspecified: Secondary | ICD-10-CM | POA: Insufficient documentation

## 2023-09-23 DIAGNOSIS — D72828 Other elevated white blood cell count: Secondary | ICD-10-CM | POA: Insufficient documentation

## 2023-09-23 MED ORDER — WEGOVY 0.25 MG/0.5ML ~~LOC~~ SOAJ: SUBCUTANEOUS | 0 refills | Status: AC

## 2023-09-23 NOTE — Telephone Encounter (Signed)
 Pharmacy Patient Advocate Encounter   Received notification from CoverMyMeds that prior authorization for Wegovy  0.25MG /0.5ML auto-injectors is required/requested.   Insurance verification completed.   The patient is insured through [employer] [facility] .   Per test claim: PA required; PA submitted to above mentioned insurance via CoverMyMeds Key/confirmation #/EOC [id number] Status is pending

## 2023-09-23 NOTE — Telephone Encounter (Signed)
 Pharmacy Patient Advocate Encounter  Received notification from [employer] [facility] that Prior Authorization for  Wegovy  0.25MG /0.5ML auto-injectors  has been DENIED.  Full denial letter will be uploaded to the media tab. See denial reason below.   PA #/Case ID/Reference #: [id number]

## 2023-09-23 NOTE — Telephone Encounter (Signed)
 Pharmacy Patient Advocate Encounter   Received notification from CoverMyMeds that prior authorization for Zepbound  2.5MG /0.5ML pen-injectors is required/requested.   Insurance verification completed.   The patient is insured through [employer] [facility] .   Per test claim:  WEGOVY  is preferred by the insurance.  If suggested medication is appropriate, Please send in a new RX and discontinue this one. If not, please advise as to why it's not appropriate so that we may request a Prior Authorization. Please note, some preferred medications may still require a PA.  If the suggested medications have not been trialed and there are no contraindications to their use, the PA will not be submitted, as it will not be approved.   Beginning September 17, 2023 [employer] will no longer cover Zepbound  solely for weight loss, the primary diagnosis must be for OSA

## 2023-09-25 DIAGNOSIS — M25571 Pain in right ankle and joints of right foot: Secondary | ICD-10-CM | POA: Insufficient documentation

## 2023-09-25 NOTE — Assessment & Plan Note (Signed)
 Continue NSAIDs, rest, ice, elevation. Follow up PRN

## 2023-09-30 ENCOUNTER — Other Ambulatory Visit (HOSPITAL_COMMUNITY): Payer: Self-pay

## 2023-10-01 ENCOUNTER — Other Ambulatory Visit (HOSPITAL_COMMUNITY): Payer: Self-pay

## 2023-10-01 NOTE — Progress Notes (Unsigned)
 [facility]  Telephone:([phone] Fax:(336) [phone]  ID: Lindsay James OB: 16-Apr-1990  MR#: [id number]  RDW#:[id number]  Patient Care Team: Kayla Jeoffrey RAMAN, FNP as PCP - General (Family Medicine) Skeet Juliene SAUNDERS, DO as Consulting Physician (Neurology)  CHIEF COMPLAINT: Elevated white blood cell count  INTERVAL HISTORY: Patient is a 33 yo who is here for an elevated white blood cell count.  Based on chart review, it appears she has had elevated white count since January 2016 ranging from normal to as high as 27.8.  Differential shows mostly neutrophils.  Labs from 09/19/2023 show white blood cell count of 12.8 with ANC of 8499.  She has past medical history significant for thalassemia trait, HTN, migraines and obesity.  She has chronic lower extremity swelling and numbness.  Reports sometimes she can barely walk.  Reports history of migraines a few times per week.  She is a current everyday smoker down to 1 pack of cigarettes every 3 to 4 days.  Previous to that she smoked 3 packs/day for about 10 to 11 years.  She also smokes marijuana which she prefers.  She developed a cough 3 to 4 days ago and some right chest and shoulder pain.  Does not think that she pulled muscle.  Cough has been worsening.  No sputum production.  No fevers.  Reports she has 2 children; son who is 50 and a daughter who will turn to in December.  Reports she was told she had an elevated white blood count when she was pregnant with her daughter.  She denies recurrent infections.  Family history of cancer in both her mother and father but she is unaware of the type.  Her dad passed away about a year ago and she thought it was from a blood cancer.  Her mom has also passed and had history of rheumatoid arthritis.   Nausea at times when she starts coughing.  Reports she feels like her appetite is low but she is thirsty all the time.  Feels like she is sweaty when she wakes up in the morning denies any  unintentional weight loss.  She has been trying to increase her activity so that she can lose weight.  Reports her energy levels are very low.     Has PMH  significant for  Past Medical History:  Diagnosis Date   Acute appendicitis    Allergy 2016   After my first child it got worst   Anemia    Chlamydia    GERD (gastroesophageal reflux disease)    I think when i was younger  but i dont take medicine for it right now   Gonorrhea    High cholesterol    Hypertension    Migraine    UTI (urinary tract infection)     REVIEW OF SYSTEMS:   Review of Systems  Constitutional:  Positive for diaphoresis and malaise/fatigue.  Respiratory:  Positive for cough and shortness of breath.   Gastrointestinal:  Positive for nausea. Negative for blood in stool and melena.  Musculoskeletal:  Positive for joint pain. Negative for falls.  Neurological:  Positive for tingling and headaches. Negative for weakness.  Psychiatric/Behavioral:  Negative for depression. The patient is not nervous/anxious and does not have insomnia.     As per HPI. Otherwise, a complete review of systems is negative.  PAST MEDICAL HISTORY: Past Medical History:  Diagnosis Date   Acute appendicitis    Allergy 2016   After my first child it  got worst   Anemia    Chlamydia    GERD (gastroesophageal reflux disease)    I think when i was younger  but i dont take medicine for it right now   Gonorrhea    High cholesterol    Hypertension    Migraine    UTI (urinary tract infection)     PAST SURGICAL HISTORY: Past Surgical History:  Procedure Laterality Date   IRRIGATION AND DEBRIDEMENT ABSCESS Left 01/31/2022   Procedure: INCISION AND DRAINAGE OF LEFT THIGH ABSCESS;  Surgeon: Paola Dreama SAILOR, MD;  Location: [facility] OR;  Service: General;  Laterality: Left;   LAPAROSCOPIC APPENDECTOMY N/A 01/18/2020   Procedure: APPENDECTOMY LAPAROSCOPIC;  Surgeon: Mavis Anes, MD;  Location: [facility]S;  Service: General;  Laterality: N/A;     FAMILY HISTORY: Family History  Problem Relation Age of Onset   Diabetes Maternal Grandmother    Cancer Father    Asthma Mother    Arthritis/Rheumatoid Mother    Kidney Stones Mother     ADVANCED DIRECTIVES (Y/N):  N  HEALTH MAINTENANCE: Social History   Tobacco Use   Smoking status: Every Day    Current packs/day: 0.00    Types: Cigarettes, Cigars    Last attempt to quit: 06/2021    Years since quitting: 2.2   Smokeless tobacco: Never   Tobacco comments:    1 black & mild daily.  stopped cigarettes  nov 2021  Vaping Use   Vaping status: Never Used  Substance Use Topics   Alcohol use: Yes    Comment: socially   Drug use: Yes    Types: Marijuana    Comment: April 2023     Colonoscopy:  PAP:  Bone density:  Lipid panel:  Allergies  Allergen Reactions   Peanut-Containing Drug Products Rash    Current Outpatient Medications  Medication Sig Dispense Refill   buPROPion  (WELLBUTRIN  SR) 150 MG 12 hr tablet Take 1 tablet (150 mg total) by mouth 2 (two) times daily. Take 1 tablet once daily for first 3 days 120 tablet 1   fluticasone  (FLONASE ) 50 MCG/ACT nasal spray Place 1 spray into both nostrils 2 (two) times daily. 16 g 0   methocarbamol  (ROBAXIN ) 500 MG tablet Take 1 tablet (500 mg total) by mouth every 6 (six) hours as needed for muscle spasms. 20 tablet 0   naproxen  (NAPROSYN ) 500 MG tablet Take 1 tablet (500 mg total) by mouth 2 (two) times daily. 30 tablet 0   pregabalin  (LYRICA ) 75 MG capsule Take 1 capsule (75 mg total) by mouth 2 (two) times daily. 60 capsule 5   WEGOVY  0.25 MG/0.5ML SOAJ Inject 0.25 mg into the skin once a week for 28 days, THEN 0.5 mg once a week for 28 days. Use this dose for 1 month (4 shots) and then increase to next higher dose. 2 mL 0   No current facility-administered medications for this visit.    OBJECTIVE: Vitals:   10/02/23 0816  BP: 137/87  Pulse: 75  Resp: 16  Temp: 98.9 F (37.2 C)  SpO2: 99%     Body mass index  is 57.12 kg/m.    ECOG FS:0 - Asymptomatic  Physical Exam Constitutional:      Appearance: Normal appearance. She is obese.  Cardiovascular:     Rate and Rhythm: Normal rate and regular rhythm.     Comments: Good pedal pulses bilaterally Pulmonary:     Effort: Pulmonary effort is normal.     Breath sounds: Normal  breath sounds.  Abdominal:     General: Bowel sounds are normal.     Palpations: Abdomen is soft.  Musculoskeletal:        General: No swelling. Normal range of motion.     Right lower leg: Edema present.     Left lower leg: Edema present.  Neurological:     Mental Status: She is alert and oriented to person, place, and time. Mental status is at baseline.       LAB RESULTS:  Lab Results  Component Value Date   NA 139 09/19/2023   K 4.0 09/19/2023   CL 104 09/19/2023   CO2 25 09/19/2023   GLUCOSE 69 09/19/2023   BUN 15 09/19/2023   CREATININE 1.16 (H) 09/19/2023   CALCIUM  9.0 09/19/2023   PROT 6.2 09/19/2023   ALBUMIN 3.5 03/16/2022   AST 16 09/19/2023   ALT 16 09/19/2023   ALKPHOS 91 03/16/2022   BILITOT 0.3 09/19/2023   GFRNONAA 60 (L) 10/01/2022   GFRAA >60 07/21/2019    Lab Results  Component Value Date   WBC 12.8 (H) 09/19/2023   NEUTROABS 8,499 (H) 09/19/2023   HGB 12.2 09/19/2023   HCT 41.4 09/19/2023   MCV 78.7 (L) 09/19/2023   PLT 264 09/19/2023     STUDIES: No results found.  ASSESSMENT:  1. Leukocytosis, unspecified type (Primary) - Seen at the request of Jeoffrey Barrio, FNP.  - Reports intermittently elevated white count since 2016 ranging from normal to 27.8.  - She denies any fevers or night sweats.  No significant weight loss. - No prior history of splenectomy.  2. Social/Family history - Works as a Company secretary at [employer]. -Has 2 children aged 2 and 45.  She is not married. -Current everyday smoker 1 pack/day for at least the last year or so.  Prior to that, she smoked 3 packs/day for about 12 years.  -Smokes marijuana  recreationally. -Both her father and mother had cancer.  Mother had rheumatoid arthritis.  She is unaware of what type of cancer they had.  PLAN:   1. Leukocytosis, unspecified type (Primary) - We discussed differential diagnosis of predominantly neutrophilic leukocytosis including reactive causes and clonal causes slight myeloproliferative neoplasms. - Recommend BCR/ABL by FISH and JAK2 V617F testing. - RTC 4 weeks for follow-up.  2. Leg swelling: -On exam it looks like lymphedema. -We discussed follow-up with PCP for possible referral to vein and vascular. -In the meantime she can wear TED hose, elevate her feet when laying down and continue to try to lose weight.  3.  Cough/right shoulder pain: -Symptom onset was 2 to 3 days ago. -Cough has been lingering for about 3 weeks. -Will get chest x-ray and right shoulder x-ray to evaluate. -Will call with results.  Patient expressed understanding and was in agreement with this plan. She also understands that She can call clinic at any time with any questions, concerns, or complaints.    Cancer Staging  No matching staging information was found for the patient.   Delon FORBES Hope, NP   10/02/2023 9:40 AM

## 2023-10-02 ENCOUNTER — Inpatient Hospital Stay: Attending: Oncology | Admitting: Oncology

## 2023-10-02 ENCOUNTER — Inpatient Hospital Stay

## 2023-10-02 ENCOUNTER — Ambulatory Visit (HOSPITAL_COMMUNITY)
Admission: RE | Admit: 2023-10-02 | Discharge: 2023-10-02 | Disposition: A | Discharge status: Home / Self Care | Source: Ambulatory Visit | Attending: Oncology | Admitting: Oncology

## 2023-10-02 VITALS — BP 137/87 | HR 75 | Temp 98.9°F | Resp 16 | Ht 64.0 in | Wt 332.8 lb

## 2023-10-02 DIAGNOSIS — M25511 Pain in right shoulder: Secondary | ICD-10-CM | POA: Insufficient documentation

## 2023-10-02 DIAGNOSIS — R059 Cough, unspecified: Secondary | ICD-10-CM | POA: Diagnosis not present

## 2023-10-02 DIAGNOSIS — Z809 Family history of malignant neoplasm, unspecified: Secondary | ICD-10-CM | POA: Diagnosis not present

## 2023-10-02 DIAGNOSIS — F1290 Cannabis use, unspecified, uncomplicated: Secondary | ICD-10-CM | POA: Insufficient documentation

## 2023-10-02 DIAGNOSIS — D72829 Elevated white blood cell count, unspecified: Secondary | ICD-10-CM

## 2023-10-02 DIAGNOSIS — M7989 Other specified soft tissue disorders: Secondary | ICD-10-CM | POA: Diagnosis not present

## 2023-10-02 DIAGNOSIS — F17210 Nicotine dependence, cigarettes, uncomplicated: Secondary | ICD-10-CM | POA: Insufficient documentation

## 2023-10-02 DIAGNOSIS — R110 Nausea: Secondary | ICD-10-CM | POA: Diagnosis not present

## 2023-10-02 LAB — URIC ACID: Uric Acid, Serum: 8.1 mg/dL — ABNORMAL HIGH (ref 2.5–7.1)

## 2023-10-02 LAB — CBC WITH DIFFERENTIAL/PLATELET
Abs Immature Granulocytes: 0.04 K/uL (ref 0.00–0.07)
Basophils Absolute: 0 K/uL (ref 0.0–0.1)
Basophils Relative: 0 %
Eosinophils Absolute: 0.4 K/uL (ref 0.0–0.5)
Eosinophils Relative: 3 %
HCT: 41.3 % (ref 36.0–46.0)
Hemoglobin: 13 g/dL (ref 12.0–15.0)
Immature Granulocytes: 0 %
Lymphocytes Relative: 23 %
Lymphs Abs: 2.8 K/uL (ref 0.7–4.0)
MCH: 23.6 pg — ABNORMAL LOW (ref 26.0–34.0)
MCHC: 31.5 g/dL (ref 30.0–36.0)
MCV: 75.1 fL — ABNORMAL LOW (ref 80.0–100.0)
Monocytes Absolute: 0.9 K/uL (ref 0.1–1.0)
Monocytes Relative: 7 %
Neutro Abs: 8.5 K/uL — ABNORMAL HIGH (ref 1.7–7.7)
Neutrophils Relative %: 67 %
Platelets: 273 K/uL (ref 150–400)
RBC: 5.5 MIL/uL — ABNORMAL HIGH (ref 3.87–5.11)
RDW: 16.4 % — ABNORMAL HIGH (ref 11.5–15.5)
WBC: 12.6 K/uL — ABNORMAL HIGH (ref 4.0–10.5)
nRBC: 0 % (ref 0.0–0.2)

## 2023-10-02 LAB — SEDIMENTATION RATE: Sed Rate: 7 mm/h (ref 0–20)

## 2023-10-02 LAB — C-REACTIVE PROTEIN: CRP: 4.9 mg/dL — ABNORMAL HIGH (ref <1.0)

## 2023-10-02 LAB — LACTATE DEHYDROGENASE: LDH: 151 U/L (ref 98–192)

## 2023-10-03 ENCOUNTER — Ambulatory Visit: Payer: Self-pay | Admitting: Oncology

## 2023-10-03 LAB — ERYTHROPOIETIN: Erythropoietin: 19.8 m[IU]/mL — ABNORMAL HIGH (ref 2.6–18.5)

## 2023-10-03 LAB — CYCLIC CITRUL PEPTIDE ANTIBODY, IGG/IGA: CCP Antibodies IgG/IgA: 8 U (ref 0–19)

## 2023-10-03 LAB — RHEUMATOID FACTOR: Rheumatoid fact SerPl-aCnc: <10 [IU]/mL (ref <14.0)

## 2023-10-03 LAB — ANA: Anti Nuclear Antibody (ANA): NEGATIVE

## 2023-10-09 LAB — JAK2 V617F RFX CALR/MPL/E12-15

## 2023-10-09 LAB — CALR +MPL + E12-E15  (REFLEX)

## 2023-10-11 LAB — BCR-ABL1 FISH
Cells Analyzed: 200
Cells Counted: 200

## 2023-10-17 ENCOUNTER — Encounter: Payer: Self-pay | Admitting: Family Medicine

## 2023-10-17 ENCOUNTER — Ambulatory Visit: Admitting: Family Medicine

## 2023-10-17 DIAGNOSIS — R600 Localized edema: Secondary | ICD-10-CM

## 2023-10-17 DIAGNOSIS — F17210 Nicotine dependence, cigarettes, uncomplicated: Secondary | ICD-10-CM

## 2023-10-17 DIAGNOSIS — Z6841 Body Mass Index (BMI) 40.0 and over, adult: Secondary | ICD-10-CM

## 2023-10-17 NOTE — Assessment & Plan Note (Signed)
Referral to vein and vascular

## 2023-10-17 NOTE — Assessment & Plan Note (Signed)
 Start Wegovy  0.25mg  weekly. Sample box provided. Increase as tolerated. Follow up in 4 weeks or sooner if unable to tolerate.

## 2023-10-17 NOTE — Assessment & Plan Note (Signed)
 Continue wellbutrin.

## 2023-10-17 NOTE — Progress Notes (Signed)
 Subjective:  HPI: Lindsay James is a 33 y.o. female presenting on 10/17/2023 for Medical Management of Chronic Issues (Anxiety,  depression, wt. Loss )   HPI Patient is in today for follow up for anxiety and weight management. Has not started wegovy  due to cost. Has started Wellbutrin  and cigarette cravings are reduced, is smoking 4 per day. Since her last visit with me has established with hematology/oncology. No findings on workup. Would like referral to vein and vascular for lower extremity edema. This is chronic unchanged. No redness, warmth, edema, recent surgery or travel, and she is not on birth control. Negative homans, no calf pain.   Review of Systems  All other systems reviewed and are negative.   Relevant past medical history reviewed and updated as indicated.   Past Medical History:  Diagnosis Date   Acute appendicitis    Allergy 2016   After my first child it got worst   Anemia    Chlamydia    GERD (gastroesophageal reflux disease)    I think when i was younger  but i dont take medicine for it right now   Gonorrhea    High cholesterol    Hypertension    Migraine    UTI (urinary tract infection)      Past Surgical History:  Procedure Laterality Date   IRRIGATION AND DEBRIDEMENT ABSCESS Left 01/31/2022   Procedure: INCISION AND DRAINAGE OF LEFT THIGH ABSCESS;  Surgeon: Paola Dreama SAILOR, MD;  Location: [facility] OR;  Service: General;  Laterality: Left;   LAPAROSCOPIC APPENDECTOMY N/A 01/18/2020   Procedure: APPENDECTOMY LAPAROSCOPIC;  Surgeon: Mavis Anes, MD;  Location: [facility];  Service: General;  Laterality: N/A;    Allergies and medications reviewed and updated.   Current Outpatient Medications:    buPROPion  (WELLBUTRIN  SR) 150 MG 12 hr tablet, Take 1 tablet (150 mg total) by mouth 2 (two) times daily. Take 1 tablet once daily for first 3 days, Disp: 120 tablet, Rfl: 1   fluticasone  (FLONASE ) 50 MCG/ACT nasal spray, Place 1 spray into both nostrils 2 (two)  times daily., Disp: 16 g, Rfl: 0   methocarbamol  (ROBAXIN ) 500 MG tablet, Take 1 tablet (500 mg total) by mouth every 6 (six) hours as needed for muscle spasms., Disp: 20 tablet, Rfl: 0   naproxen  (NAPROSYN ) 500 MG tablet, Take 1 tablet (500 mg total) by mouth 2 (two) times daily., Disp: 30 tablet, Rfl: 0   pregabalin  (LYRICA ) 75 MG capsule, Take 1 capsule (75 mg total) by mouth 2 (two) times daily., Disp: 60 capsule, Rfl: 5   WEGOVY  0.25 MG/0.5ML SOAJ, Inject 0.25 mg into the skin once a week for 28 days, THEN 0.5 mg once a week for 28 days. Use this dose for 1 month (4 shots) and then increase to next higher dose. (Patient not taking: No sig reported), Disp: 2 mL, Rfl: 0  Allergies  Allergen Reactions   Peanut-Containing Drug Products Rash    Objective:   BP 115/78   Pulse 67   Temp 98 F (36.7 C)   Ht 5' 4 (1.626 m)   Wt (!) 334 lb 3.2 oz (151.6 kg)   LMP 08/25/2023   SpO2 95%   Breastfeeding No   BMI 57.37 kg/m      10/17/2023   11:17 AM 10/02/2023    8:16 AM 09/19/2023    9:06 AM  Vitals with BMI  Height 5' 4 5' 4 5' 4  Weight 334 lbs 3 oz 332 lbs  13 oz 334 lbs 10 oz  BMI 57.34 57.1 57.41  Systolic 115 137 878  Diastolic 78 87 80  Pulse 67 75 69     Physical Exam Vitals and nursing note reviewed.  Constitutional:      Appearance: Normal appearance. She is normal weight.  HENT:     Head: Normocephalic and atraumatic.  Musculoskeletal:     Right lower leg: Edema present.     Left lower leg: Edema present.  Skin:    General: Skin is warm and dry.  Neurological:     General: No focal deficit present.     Mental Status: She is alert and oriented to person, place, and time. Mental status is at baseline.  Psychiatric:        Mood and Affect: Mood normal.        Behavior: Behavior normal.        Thought Content: Thought content normal.        Judgment: Judgment normal.     Assessment & Plan:  Morbid obesity (HCC) Assessment & Plan: Start Wegovy  0.25mg   weekly. Sample box provided. Increase as tolerated. Follow up in 4 weeks or sooner if unable to tolerate.    Bilateral lower extremity edema Assessment & Plan: Referral to vein and vascular  Orders: -     Ambulatory referral to Vascular Surgery  Cigarette nicotine dependence without complication Assessment & Plan: Continue wellbutrin        Follow up plan: Return in about 4 weeks (around 11/14/2023) for weight management.  Jeoffrey GORMAN Barrio, FNP

## 2023-10-29 NOTE — Progress Notes (Unsigned)
 [facility]  Telephone:(336) [phone] Fax:(336) [phone]  ID: Lindsay James OB: 1990/07/21  MR#: [id number]  RDW#:[id number]  Patient Care Team: Kayla Jeoffrey RAMAN, FNP as PCP - General (Family Medicine) Skeet Juliene SAUNDERS, DO as Consulting Physician (Neurology)  CHIEF COMPLAINT: Elevated white blood cell count  INTERVAL HISTORY: Patient is a 33 yo who is here for an elevated white blood cell count.  Based on chart review, it appears she has had elevated white count since January 2016 ranging from normal to as high as 27.8.  Differential shows mostly neutrophils.  Labs from 09/19/2023 show white blood cell count of 12.8 with ANC of 8499.  She has past medical history significant for thalassemia trait, HTN, migraines and obesity.  She has chronic lower extremity swelling and numbness.  Reports sometimes she can barely walk.  Reports history of migraines a few times per week.  She is a current everyday smoker down to 1 pack of cigarettes every 3 to 4 days.  Previous to that she smoked 3 packs/day for about 10 to 11 years.  She also smokes marijuana which she prefers.  She developed a cough 3 to 4 days ago and some right chest and shoulder pain.  Does not think that she pulled muscle.  Cough has been worsening.  No sputum production.  No fevers.  Reports she has 2 children; son who is 53 and a daughter who will turn to in December.  Reports she was told she had an elevated white blood count when she was pregnant with her daughter.  She denies recurrent infections.  Family history of cancer in both her mother and father but she is unaware of the type.  Her dad passed away about a year ago and she thought it was from a blood cancer.  Her mom has also passed and had history of rheumatoid arthritis.   Nausea at times when she starts coughing.  Reports she feels like her appetite is low but she is thirsty all the time.  Feels like she is sweaty when she wakes up in the morning denies any  unintentional weight loss.  She has been trying to increase her activity so that she can lose weight.  Reports her energy levels are very low.     Has PMH  significant for  Past Medical History:  Diagnosis Date   Acute appendicitis    Allergy 2016   After my first child it got worst   Anemia    Chlamydia    GERD (gastroesophageal reflux disease)    I think when i was younger  but i dont take medicine for it right now   Gonorrhea    High cholesterol    Hypertension    Migraine    UTI (urinary tract infection)     REVIEW OF SYSTEMS:   ROS  As per HPI. Otherwise, a complete review of systems is negative.  PAST MEDICAL HISTORY: Past Medical History:  Diagnosis Date   Acute appendicitis    Allergy 2016   After my first child it got worst   Anemia    Chlamydia    GERD (gastroesophageal reflux disease)    I think when i was younger  but i dont take medicine for it right now   Gonorrhea    High cholesterol    Hypertension    Migraine    UTI (urinary tract infection)     PAST SURGICAL HISTORY: Past Surgical History:  Procedure Laterality Date  IRRIGATION AND DEBRIDEMENT ABSCESS Left 01/31/2022   Procedure: INCISION AND DRAINAGE OF LEFT THIGH ABSCESS;  Surgeon: Paola Dreama SAILOR, MD;  Location: [facility] OR;  Service: General;  Laterality: Left;   LAPAROSCOPIC APPENDECTOMY N/A 01/18/2020   Procedure: APPENDECTOMY LAPAROSCOPIC;  Surgeon: Mavis Anes, MD;  Location: [facility];  Service: General;  Laterality: N/A;    FAMILY HISTORY: Family History  Problem Relation Age of Onset   Diabetes Maternal Grandmother    Cancer Father    Asthma Mother    Arthritis/Rheumatoid Mother    Kidney Stones Mother     ADVANCED DIRECTIVES (Y/N):  N  HEALTH MAINTENANCE: Social History   Tobacco Use   Smoking status: Every Day    Current packs/day: 0.00    Types: Cigarettes, Cigars    Last attempt to quit: 06/2021    Years since quitting: 2.3   Smokeless tobacco: Never   Tobacco  comments:    1 black & mild daily.  stopped cigarettes  nov 2021  Vaping Use   Vaping status: Never Used  Substance Use Topics   Alcohol use: Yes    Comment: socially   Drug use: Yes    Types: Marijuana    Comment: April 2023     Colonoscopy:  PAP:  Bone density:  Lipid panel:  Allergies  Allergen Reactions   Peanut-Containing Drug Products Rash    Current Outpatient Medications  Medication Sig Dispense Refill   buPROPion  (WELLBUTRIN  SR) 150 MG 12 hr tablet Take 1 tablet (150 mg total) by mouth 2 (two) times daily. Take 1 tablet once daily for first 3 days 120 tablet 1   fluticasone  (FLONASE ) 50 MCG/ACT nasal spray Place 1 spray into both nostrils 2 (two) times daily. 16 g 0   methocarbamol  (ROBAXIN ) 500 MG tablet Take 1 tablet (500 mg total) by mouth every 6 (six) hours as needed for muscle spasms. 20 tablet 0   naproxen  (NAPROSYN ) 500 MG tablet Take 1 tablet (500 mg total) by mouth 2 (two) times daily. 30 tablet 0   pregabalin  (LYRICA ) 75 MG capsule Take 1 capsule (75 mg total) by mouth 2 (two) times daily. 60 capsule 5   WEGOVY  0.25 MG/0.5ML SOAJ Inject 0.25 mg into the skin once a week for 28 days, THEN 0.5 mg once a week for 28 days. Use this dose for 1 month (4 shots) and then increase to next higher dose. (Patient not taking: No sig reported) 2 mL 0   No current facility-administered medications for this visit.    OBJECTIVE: There were no vitals filed for this visit.    There is no height or weight on file to calculate BMI.    ECOG FS:0 - Asymptomatic  Physical Exam    LAB RESULTS:  Lab Results  Component Value Date   NA 139 09/19/2023   K 4.0 09/19/2023   CL 104 09/19/2023   CO2 25 09/19/2023   GLUCOSE 69 09/19/2023   BUN 15 09/19/2023   CREATININE 1.16 (H) 09/19/2023   CALCIUM  9.0 09/19/2023   PROT 6.2 09/19/2023   ALBUMIN 3.5 03/16/2022   AST 16 09/19/2023   ALT 16 09/19/2023   ALKPHOS 91 03/16/2022   BILITOT 0.3 09/19/2023   GFRNONAA 60 (L)  10/01/2022   GFRAA >60 07/21/2019    Lab Results  Component Value Date   WBC 12.6 (H) 10/02/2023   NEUTROABS 8.5 (H) 10/02/2023   HGB 13.0 10/02/2023   HCT 41.3 10/02/2023   MCV 75.1 (L) 10/02/2023  PLT 273 10/02/2023     STUDIES: DG Shoulder Right Result Date: 10/02/2023 CLINICAL DATA:  right chest and shoulder pain. EXAM: RIGHT SHOULDER - 2+ VIEW COMPARISON:  None Available. FINDINGS: No acute fracture or dislocation. No aggressive osseous lesion. Glenohumeral and acromioclavicular joints are normal in alignment. No significant degenerative changes. No soft tissue swelling. No radiopaque foreign bodies. IMPRESSION: No acute osseous abnormality of the right shoulder. Electronically Signed   By: Ree Molt M.D.   On: 10/02/2023 09:44   DG Chest 2 View Result Date: 10/02/2023 CLINICAL DATA:  Cough. EXAM: CHEST - 2 VIEW COMPARISON:  10/01/2022. FINDINGS: Bilateral lung fields are clear. Bilateral costophrenic angles are clear. Normal cardio-mediastinal silhouette. No acute osseous abnormalities. The soft tissues are within normal limits. IMPRESSION: No active cardiopulmonary disease. Electronically Signed   By: Ree Molt M.D.   On: 10/02/2023 09:44    ASSESSMENT:  1. Leukocytosis, unspecified type (Primary) - Seen at the request of Jeoffrey Barrio, FNP.  - Reports intermittently elevated white count since 2016 ranging from normal to 27.8.  - She denies any fevers or night sweats.  No significant weight loss. - No prior history of splenectomy.  2. 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  She is not married. -Current everyday smoker 1 pack/day for at least the last year or so.  Prior to that, she smoked 3 packs/day for about 12 years.  -Smokes marijuana recreationally. -Both her father and mother had cancer.  Mother had rheumatoid arthritis.  She is unaware of what type of cancer they had.  PLAN:   1. Leukocytosis, unspecified  type (Primary) - We discussed differential diagnosis of predominantly neutrophilic leukocytosis including reactive causes and clonal causes slight myeloproliferative neoplasms. - Recommend BCR/ABL by FISH and JAK2 V617F testing. - RTC 4 weeks for follow-up.  2. Leg swelling: -On exam it looks like lymphedema. -We discussed follow-up with PCP for possible referral to vein and vascular. -In the meantime she can wear TED hose, elevate her feet when laying down and continue to try to lose weight.  3.  Cough/right shoulder pain: -Symptom onset was 2 to 3 days ago. -Cough has been lingering for about 3 weeks. -Will get chest x-ray and right shoulder x-ray to evaluate. -Will call with results.  Patient expressed understanding and was in agreement with this plan. She also understands that She can call clinic at any time with any questions, concerns, or complaints.    Cancer Staging  No matching staging information was found for the patient.   Delon FORBES Hope, NP   10/29/2023 7:56 PM

## 2023-10-30 ENCOUNTER — Inpatient Hospital Stay

## 2023-10-30 ENCOUNTER — Inpatient Hospital Stay: Attending: Oncology | Admitting: Oncology

## 2023-10-30 VITALS — BP 103/73 | HR 80 | Temp 98.3°F | Resp 20 | Wt 322.5 lb

## 2023-10-30 DIAGNOSIS — Z791 Long term (current) use of non-steroidal anti-inflammatories (NSAID): Secondary | ICD-10-CM | POA: Insufficient documentation

## 2023-10-30 DIAGNOSIS — N920 Excessive and frequent menstruation with regular cycle: Secondary | ICD-10-CM | POA: Insufficient documentation

## 2023-10-30 DIAGNOSIS — D72829 Elevated white blood cell count, unspecified: Secondary | ICD-10-CM | POA: Diagnosis present

## 2023-10-30 DIAGNOSIS — Z809 Family history of malignant neoplasm, unspecified: Secondary | ICD-10-CM | POA: Insufficient documentation

## 2023-10-30 DIAGNOSIS — F17210 Nicotine dependence, cigarettes, uncomplicated: Secondary | ICD-10-CM | POA: Diagnosis not present

## 2023-10-30 DIAGNOSIS — D649 Anemia, unspecified: Secondary | ICD-10-CM

## 2023-10-30 DIAGNOSIS — D563 Thalassemia minor: Secondary | ICD-10-CM | POA: Diagnosis not present

## 2023-10-30 DIAGNOSIS — Z79899 Other long term (current) drug therapy: Secondary | ICD-10-CM | POA: Insufficient documentation

## 2023-10-30 LAB — CBC WITH DIFFERENTIAL/PLATELET
Abs Immature Granulocytes: 0.04 K/uL (ref 0.00–0.07)
Basophils Absolute: 0 K/uL (ref 0.0–0.1)
Basophils Relative: 0 %
Eosinophils Absolute: 0.3 K/uL (ref 0.0–0.5)
Eosinophils Relative: 2 %
HCT: 41.7 % (ref 36.0–46.0)
Hemoglobin: 13.3 g/dL (ref 12.0–15.0)
Immature Granulocytes: 0 %
Lymphocytes Relative: 26 %
Lymphs Abs: 3 K/uL (ref 0.7–4.0)
MCH: 23.7 pg — ABNORMAL LOW (ref 26.0–34.0)
MCHC: 31.9 g/dL (ref 30.0–36.0)
MCV: 74.2 fL — ABNORMAL LOW (ref 80.0–100.0)
Monocytes Absolute: 0.8 K/uL (ref 0.1–1.0)
Monocytes Relative: 7 %
Neutro Abs: 7.2 K/uL (ref 1.7–7.7)
Neutrophils Relative %: 65 %
Platelets: 290 K/uL (ref 150–400)
RBC: 5.62 MIL/uL — ABNORMAL HIGH (ref 3.87–5.11)
RDW: 16 % — ABNORMAL HIGH (ref 11.5–15.5)
WBC: 11.3 K/uL — ABNORMAL HIGH (ref 4.0–10.5)
nRBC: 0 % (ref 0.0–0.2)

## 2023-10-30 LAB — FOLATE: Folate: 5.5 ng/mL — ABNORMAL LOW (ref >5.9)

## 2023-10-30 LAB — IRON AND TIBC
Iron: 37 ug/dL (ref 28–170)
Saturation Ratios: 12 % (ref 10.4–31.8)
TIBC: 303 ug/dL (ref 250–450)
UIBC: 266 ug/dL

## 2023-10-30 LAB — VITAMIN B12: Vitamin B-12: 249 pg/mL (ref 180–914)

## 2023-10-30 LAB — FERRITIN: Ferritin: 41 ng/mL (ref 11–307)

## 2023-10-30 MED ORDER — VITAMIN D 25 MCG (1000 UNIT) PO TABS: 1000.0000 [IU] | ORAL_TABLET | Freq: Every day | ORAL | 0 refills | Status: AC

## 2023-10-31 LAB — COPPER, SERUM: Copper: 136 ug/dL (ref 80–158)

## 2023-11-05 LAB — METHYLMALONIC ACID, SERUM: Methylmalonic Acid, Quantitative: 120 nmol/L (ref 0–378)

## 2023-11-14 DIAGNOSIS — R5383 Other fatigue: Secondary | ICD-10-CM | POA: Insufficient documentation

## 2023-11-14 DIAGNOSIS — M7989 Other specified soft tissue disorders: Secondary | ICD-10-CM | POA: Insufficient documentation

## 2023-11-14 NOTE — Assessment & Plan Note (Deleted)
 On exam it looks like lymphedema. -We discussed follow-up with PCP for possible referral to vein and vascular. -In the meantime she can wear TED hose, elevate her feet when laying down and continue to try to lose weight.

## 2023-11-14 NOTE — Progress Notes (Signed)
 [facility] OFFICE PROGRESS NOTE  Lindsay Jeoffrey RAMAN, FNP  I connected with Lindsay James on 11/18/23 at  9:30 AM EDT by telephone visit and verified that I am speaking with the correct person using two identifiers.   I discussed the limitations, risks, security and privacy concerns of performing an evaluation and management service by telemedicine and the availability of in-person appointments. I also discussed with the patient that there may be a patient responsible charge related to this service. The patient expressed understanding and agreed to proceed.   Other persons participating in the visit and their role in the encounter: NP, Patient   Patient's location: Home Provider's location: Clinic   ASSESSMENT & PLAN:  Assessment & Plan Fatigue, unspecified type Patient had labs drawn on 11/07/2023 due to fatigue. Folate level 5.5.  Recommend 1 mg folic acid  daily. B12 level 249 within normal MMA.  Recommend starting B12 OTC supplements 1000 mcg daily. Hemoglobin is normal at 13.3 but her ferritin is on the lower end of normal at 41 with an iron saturation of 12%.  She has never tried oral iron.  Given she is not anemic, recommended oral iron 324 mg each day along with vitamin C. RX sent to [facility]. Discussed if she can't tolerate would recommend Iv iron but will try oral for now.  Recheck labs in 4 months unless she needs it done sooner.     Orders Placed This Encounter  Procedures   Ferritin    Standing Status:   Future    Expected Date:   03/16/2024    Expiration Date:   06/14/2024   CBC with Differential/Platelet    Standing Status:   Future    Expected Date:   03/16/2024    Expiration Date:   06/14/2024   Copper , serum    Standing Status:   Future    Expected Date:   03/16/2024    Expiration Date:   06/14/2024   Vitamin B12    Standing Status:   Future    Expected Date:   03/16/2024    Expiration Date:   06/14/2024   Methylmalonic acid, serum     Standing Status:   Future    Expected Date:   03/16/2024    Expiration Date:   06/14/2024   Iron and TIBC    Standing Status:   Future    Expected Date:   03/16/2024    Expiration Date:   06/14/2024   Folate    Standing Status:   Future    Expected Date:   03/16/2024    Expiration Date:   06/14/2024    INTERVAL HISTORY: Patient returns for follow-up to review lab work. Originally seen for leukocytosis. See previous note.  Overall she feels about the same. She still is having fatigue. No bleeding per rectum. Reports she is under quite a bit of stress as her partner has been hospitalized for the past few days. She continues to smoke but is trying to cut back.   We reviewed iron panel, folate, mma, b12, cbc.   SUMMARY OF HEMATOLOGIC HISTORY: Oncology History   No history exists.   1. Leukocytosis, unspecified type (Primary) - Seen at the request of [name], FNP.  - Reports intermittently elevated white count since 2016 ranging from normal to 27.8.  - She denies any fevers or night sweats.  No significant weight loss. - No prior history of splenectomy.   2. 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 -Has 2 children aged 2 and 51.  She is not married. -Current everyday smoker 1 pack/day for at least the last year or so.  Prior to that, she smoked 3 packs/day for about 12 years.  -Smokes marijuana recreationally. -Both her father and mother had cancer.  Mother had rheumatoid arthritis.  She is unaware of what type of cancer they had.  CBC    Component Value Date/Time   WBC 11.3 (H) 10/30/2023 1101   RBC 5.62 (H) 10/30/2023 1101   HGB 13.3 10/30/2023 1101   HGB 10.8 (L) 11/30/2021 0830   HCT 41.7 10/30/2023 1101   HCT 34.5 11/30/2021 0830   PLT 290 10/30/2023 1101   PLT 253 11/30/2021 0830   MCV 74.2 (L) 10/30/2023 1101   MCV 77 (L) 11/30/2021 0830   MCH 23.7 (L) 10/30/2023 1101   MCHC 31.9 10/30/2023 1101   RDW 16.0 (H) 10/30/2023 1101   RDW 15.6  (H) 11/30/2021 0830   LYMPHSABS 3.0 10/30/2023 1101   LYMPHSABS 2.1 08/30/2021 1639   MONOABS 0.8 10/30/2023 1101   EOSABS 0.3 10/30/2023 1101   EOSABS 0.0 08/30/2021 1639   BASOSABS 0.0 10/30/2023 1101   BASOSABS 0.0 08/30/2021 1639       Latest Ref Rng & Units 09/19/2023    9:45 AM 10/01/2022   10:30 PM 06/28/2022   11:08 AM  CMP  Glucose 65 - 99 mg/dL 69  99  74   BUN 7 - 25 mg/dL 15  19  14    Creatinine 0.50 - 0.97 mg/dL 8.83  8.75  8.92   Sodium 135 - 146 mmol/L 139  136  138   Potassium 3.5 - 5.3 mmol/L 4.0  3.6  4.5   Chloride 98 - 110 mmol/L 104  103  104   CO2 20 - 32 mmol/L 25  23  25    Calcium  8.6 - 10.2 mg/dL 9.0  8.7  9.3   Total Protein 6.1 - 8.1 g/dL 6.2   6.9   Total Bilirubin 0.2 - 1.2 mg/dL 0.3   0.5   AST 10 - 30 U/L 16   17   ALT 6 - 29 U/L 16   21      Lab Results  Component Value Date   FERRITIN 41 10/30/2023   VITAMINB12 249 10/30/2023    There were no vitals filed for this visit.  Review of System:  Review of Systems  Constitutional:  Positive for malaise/fatigue.  Gastrointestinal:  Negative for abdominal pain, blood in stool, diarrhea, melena, nausea and vomiting.  Neurological:  Negative for dizziness, sensory change and headaches.    Physical Exam: Physical Exam Neurological:     Mental Status: She is alert and oriented to person, place, and time.      I provided 14 minutes of non face-to-face telephone visit time during this encounter, and > 50% was spent counseling as documented under my assessment & plan.   Delon Hope, NP 11/18/2023 9:09 AM

## 2023-11-14 NOTE — Assessment & Plan Note (Addendum)
 Patient had labs drawn on 11/07/2023 due to fatigue. Folate level 5.5.  Recommend 1 mg folic acid  daily. B12 level 249 within normal MMA.  Recommend starting B12 OTC supplements 1000 mcg daily. Hemoglobin is normal at 13.3 but her ferritin is on the lower end of normal at 41 with an iron saturation of 12%.  She has never tried oral iron.  Given she is not anemic, recommended oral iron 324 mg each day along with vitamin C. RX sent to [facility]. Discussed if she can't tolerate would recommend Iv iron but will try oral for now.  Recheck labs in 4 months unless she needs it done sooner.

## 2023-11-14 NOTE — Assessment & Plan Note (Deleted)
-   Etiology felt to be secondary to smoking and obesity. -She may have some underlying sleep apnea as well. -Workup from 10/02/2023 was fairly unremarkable.  BCR able FISH testing was negative, JAK2 V617F with reflex was also negative.  Negative for autoimmune disorders with either nonreactive or negative results.  LDH was normal.  Uric acid elevated at 8.1.  Erythropoietin  level elevated at 19.8. -We discussed smoking cessation and weight loss.  Continue to monitor labs.

## 2023-11-15 ENCOUNTER — Inpatient Hospital Stay (HOSPITAL_BASED_OUTPATIENT_CLINIC_OR_DEPARTMENT_OTHER): Admitting: Oncology

## 2023-11-15 ENCOUNTER — Encounter: Payer: Self-pay | Admitting: Oncology

## 2023-11-15 DIAGNOSIS — D72829 Elevated white blood cell count, unspecified: Secondary | ICD-10-CM

## 2023-11-15 DIAGNOSIS — M7989 Other specified soft tissue disorders: Secondary | ICD-10-CM

## 2023-11-15 DIAGNOSIS — R5383 Other fatigue: Secondary | ICD-10-CM | POA: Diagnosis not present

## 2023-11-15 MED ORDER — VITAMIN B-12 1000 MCG PO TABS: 1000.0000 ug | ORAL_TABLET | Freq: Every day | ORAL | 2 refills | Status: AC

## 2023-11-15 MED ORDER — FERROUS GLUCONATE 324 (38 FE) MG PO TABS: 324.0000 mg | ORAL_TABLET | Freq: Every day | ORAL | 3 refills | Status: DC

## 2023-11-15 MED ORDER — FOLIC ACID 1 MG PO TABS: 1.0000 mg | ORAL_TABLET | Freq: Every day | ORAL | 2 refills | Status: AC

## 2023-11-15 NOTE — Assessment & Plan Note (Signed)
 Folate level low.  Recommend starting 1 mg folic acid  supplements.  Recheck in 6 months.

## 2023-11-19 ENCOUNTER — Encounter: Payer: Self-pay | Admitting: Family Medicine

## 2024-01-10 ENCOUNTER — Telehealth: Payer: Self-pay | Admitting: Neurology

## 2024-01-10 NOTE — Telephone Encounter (Signed)
 Patient advised of note from 06/2023, Check repeat MRI of brain with and without contrast in 6 months to follow up on pineal cyst    We have sent a referral to [facility] for your MRI and they will call you directly to schedule your appointment. They are located at [address] [facility]. If you need to contact them directly please call [phone].

## 2024-01-10 NOTE — Telephone Encounter (Signed)
 Patient wanted to see if Dr Skeet would order a MRI she was not sure if she had one

## 2024-01-12 ENCOUNTER — Encounter (HOSPITAL_COMMUNITY): Payer: Self-pay | Admitting: *Deleted

## 2024-01-12 ENCOUNTER — Emergency Department (HOSPITAL_COMMUNITY)

## 2024-01-12 ENCOUNTER — Emergency Department (HOSPITAL_COMMUNITY)
Admission: EM | Admit: 2024-01-12 | Discharge: 2024-01-12 | Disposition: A | Discharge status: Home / Self Care | Attending: Emergency Medicine | Admitting: Emergency Medicine

## 2024-01-12 ENCOUNTER — Other Ambulatory Visit: Payer: Self-pay

## 2024-01-12 DIAGNOSIS — X509XXA Other and unspecified overexertion or strenuous movements or postures, initial encounter: Secondary | ICD-10-CM | POA: Diagnosis not present

## 2024-01-12 DIAGNOSIS — M25571 Pain in right ankle and joints of right foot: Secondary | ICD-10-CM | POA: Diagnosis present

## 2024-01-12 DIAGNOSIS — S93401A Sprain of unspecified ligament of right ankle, initial encounter: Secondary | ICD-10-CM | POA: Insufficient documentation

## 2024-01-12 DIAGNOSIS — Z91010 Allergy to peanuts: Secondary | ICD-10-CM | POA: Diagnosis not present

## 2024-01-12 DIAGNOSIS — Y990 Civilian activity done for income or pay: Secondary | ICD-10-CM | POA: Diagnosis not present

## 2024-01-12 NOTE — Discharge Instructions (Addendum)
 You were seen here today for your ankle pain.  It appears you have a ankle sprain which is a injury of the ligaments in your ankle.  This will heal with time.  Your x-ray today did not show any signs of a fracture or dislocation. You do have flat feet, and arch support in your normal footwear will likely be beneficial in the future to help prevent foot pain.   You have been placed into a walking boot to help with the pain.  You may wean out of the boot as tolerated. Please perform gentle range of motion exercises as provided (like writing the ABC's with your ankle) to prevent stiffness in the ankle and to strengthen the ankle.  Please follow-up with the orthopedic provider listed below within the next week for further management of your ankle sprain.  You may take up to 1000mg  of tylenol  every 6 hours as needed for pain.  Do not take more then 4g per day.  You may use up to 600mg  ibuprofen  every 6 hours as needed for pain.  Do not exceed 2.4g of ibuprofen  per day.  You may ice and elevate the ankle to help with pain.  Return to the ER if you have any numbness or tingling in your foot, worsening pain, any other new or concerning symptoms.

## 2024-01-12 NOTE — ED Notes (Signed)
  at bedside

## 2024-01-12 NOTE — ED Provider Notes (Signed)
 [place] EMERGENCY DEPARTMENT AT [facility] Provider Note   CSN: [id number] Arrival date & time: 01/12/24  1009     Patient presents with: Foot Pain   Lindsay James is a 33 y.o. female with obesity, presents with concern for right ankle pain that started a couple days ago.  Reports she was at work when she seemed to slipped on oil on the floor.  She did not fall, but this caused her to move her foot in a odd way.  Since then, she has been having some pain to the lateral aspect of her right ankle.  She denies any numbness or tingling to her foot.  Reports difficulty bearing weight on her foot.    Foot Pain       Prior to Admission medications   Medication Sig Start Date End Date Taking? Authorizing Provider  buPROPion  (WELLBUTRIN  SR) 150 MG 12 hr tablet Take 1 tablet (150 mg total) by mouth 2 (two) times daily. Take 1 tablet once daily for first 3 days 09/19/23   Kayla Jeoffrey RAMAN, FNP  cholecalciferol (VITAMIN D3) 25 MCG (1000 UNIT) tablet Take 1 tablet (1,000 Units total) by mouth daily. 10/30/23   Geofm Delon BRAVO, NP  cyanocobalamin  (VITAMIN B12) 1000 MCG tablet Take 1 tablet (1,000 mcg total) by mouth daily. 11/15/23   Geofm Delon BRAVO, NP  ferrous gluconate  (FERGON) 324 MG tablet Take 1 tablet (324 mg total) by mouth daily with breakfast. 11/15/23   Geofm Delon BRAVO, NP  fluticasone  (FLONASE ) 50 MCG/ACT nasal spray Place 1 spray into both nostrils 2 (two) times daily. 03/21/23   Stuart Vernell Norris, PA-C  folic acid  (FOLVITE ) 1 MG tablet Take 1 tablet (1 mg total) by mouth daily. 11/15/23   Geofm Delon BRAVO, NP  methocarbamol  (ROBAXIN ) 500 MG tablet Take 1 tablet (500 mg total) by mouth every 6 (six) hours as needed for muscle spasms. 07/29/23   Suellen Cantor A, PA-C  naproxen  (NAPROSYN ) 500 MG tablet Take 1 tablet (500 mg total) by mouth 2 (two) times daily. 07/29/23   Suellen Cantor A, PA-C  pregabalin  (LYRICA ) 75 MG capsule Take 1 capsule (75 mg total) by mouth  2 (two) times daily. 08/19/23   Skeet Juliene SAUNDERS, DO  promethazine  (PHENERGAN ) 25 MG tablet Take 1 tablet (25 mg total) by mouth every 6 (six) hours as needed for nausea or vomiting. 07/22/19 10/28/19  Haze Lonni PARAS, MD    Allergies: Peanut-containing drug products    Review of Systems  Musculoskeletal:        Right ankle pain    Updated Vital Signs BP 110/71 (BP Location: Right Arm)   Pulse 81   Temp 98 F (36.7 C) (Oral)   Resp 18   Ht 5' 4 (1.626 m)   Wt (!) 145.2 kg   SpO2 100%   BMI 54.93 kg/m   Physical Exam Vitals and nursing note reviewed.  Constitutional:      Appearance: Normal appearance. She is obese.  HENT:     Head: Atraumatic.  Cardiovascular:     Comments: 2+ pedal pulses bilaterally Pulmonary:     Effort: Pulmonary effort is normal.  Musculoskeletal:     Comments: Right lower extremity: General No erythema, edema, contusions, open wounds. Pes planus deformity to the feet  Palpation Tender along the ATFL and CFL.  Nontender over the PTFL or Achilles tendon. Nontender along the tibia and fibula, including the lateral and medial malleolus Nontender along the first through  fifth metatarsals and phalanges  ROM Full ankle flexion, extension, inversion and eversion  Sensation: Sensation intact throughout the lower extremity   Neurological:     General: No focal deficit present.     Mental Status: She is alert.     Comments: Intact sensation to the feet bilaterally  Psychiatric:        Mood and Affect: Mood normal.        Behavior: Behavior normal.     (all labs ordered are listed, but only abnormal results are displayed) Labs Reviewed - No data to display  EKG: None  Radiology: DG Foot Complete Right Result Date: 01/12/2024 EXAM: 3 OR MORE VIEW(S) XRAY OF THE RIGHT FOOT 01/12/2024 11:27:00 AM COMPARISON: None available. CLINICAL HISTORY: right foot pain. Per chart: Pt states she broke her right foot a few years ago and has been working a  lot of hours at work where she is on her feet a lot and is having severe pain to her right foot and unable to bear weight FINDINGS: BONES AND JOINTS: Pes planus deformity. No acute fracture. No focal osseous lesion. No joint dislocation. SOFT TISSUES: Mild diffuse forefoot soft tissue swelling. IMPRESSION: 1. No acute fracture or dislocation. 2. Mild diffuse forefoot soft tissue swelling. 3. Pes planus deformity. Electronically signed by: Norleen Boxer MD 01/12/2024 11:59 AM EDT RP Workstation: [id number]     Procedures   Medications Ordered in the ED - No data to display                                  Medical Decision Making Amount and/or Complexity of Data Reviewed Radiology: ordered.    Differential diagnosis includes but is not limited to fracture, dislocation, sprain, strain, contusion, laceration, nerve injury, vascular injury, compartment syndrome  ED Course:  Upon initial evaluation, patient is well-appearing, no acute distress.  Having pain to the lateral aspect of her right ankle.  On exam, she does have tenderness to the ATFL and the CFL.  This occurred after slipping on some oil at work.  She does not have any bony tenderness to the tibia, fibula, 1st through 5th metatarsals or phalanges.  She has full range of motion of the ankle on exam, but does report some pain with range of motion of the ankle.  She is neurovascularly intact in the bilateral lower extremities.   Imaging Studies ordered: I ordered imaging studies including x-ray right foot  I independently visualized the imaging with scope of interpretation limited to determining acute life threatening conditions related to emergency care. Imaging showed  IMPRESSION:  1. No acute fracture or dislocation.  2. Mild diffuse forefoot soft tissue swelling.  3. Pes planus deformity.   I agree with the radiologist interpretation   Medications Given: None  Imaging was reviewed which revealed no acute fracture or  dislocation.  Given patient's area of pain, suspect ankle sprain.  Was placed into CAM boot to provide support.  She was able to ambulate with CAM boot on without difficulty. She also is obese and has flat feet, suspect that some of the ongoing pain she reports in her feet are likely due to the increased weight and loss of ankle support. No calf swelling, and given pain started after injury, doubt DVT. No overlying wounds or erythema to suggest infection.  Patient stable and appropriate for discharge home.   Impression: Right ankle sprain   Disposition:  Patient  discharged home with instructions to wear cam boot provided.  May wean out of the boot as tolerated.  Follow-up with orthopedics within the next week for further care.  Their contact information was provided. Tylenol  and ibuprofen  as needed for pain.  We discussed that she does have flatfeet and would probably benefit from arch support in her footwear in the future. Return precautions given and patient verbalized understanding.    This chart was dictated using voice recognition software, Dragon. Despite the best efforts of this provider to proofread and correct errors, errors may still occur which can change documentation meaning.      Final diagnoses:  Moderate right ankle sprain, initial encounter    ED Discharge Orders     None          Veta Palma, DEVONNA 01/12/24 1310    Melvenia Motto, MD 01/12/24 [phone]

## 2024-01-12 NOTE — ED Notes (Signed)
 Pt ambulated to restroom.

## 2024-01-12 NOTE — ED Triage Notes (Signed)
 Pt states she broke her right foot a few years ago and has been working a lot of hours at work where she is on her feet a lot and is having severe pain to her right foot and unable to bear weight

## 2024-01-14 ENCOUNTER — Ambulatory Visit: Admitting: Neurology

## 2024-01-23 ENCOUNTER — Telehealth: Payer: Self-pay | Admitting: Neurology

## 2024-01-23 NOTE — Telephone Encounter (Signed)
 Spoke to [place] at [facility]. Advised Referral is to scheduled in October not April of this year. Even if it was called in April patient needed to scheduled for October please give her a call to be seen.    Per Rep [place] they will give the patient a call.    Patient advised [facility] will call her to schedule.

## 2024-01-23 NOTE — Telephone Encounter (Signed)
 Pt called and LM to speak with sheena about the MRI she is supposed to get

## 2024-02-11 ENCOUNTER — Encounter: Payer: Self-pay | Admitting: Neurology

## 2024-02-28 ENCOUNTER — Ambulatory Visit
Admission: RE | Admit: 2024-02-28 | Discharge: 2024-02-28 | Disposition: A | Source: Ambulatory Visit | Attending: Neurology

## 2024-02-28 DIAGNOSIS — E348 Other specified endocrine disorders: Secondary | ICD-10-CM

## 2024-02-28 MED ORDER — GADOPICLENOL 0.5 MMOL/ML IV SOLN
10.0000 mL | Freq: Once | INTRAVENOUS | Status: AC | PRN
  Administered 2024-02-28: 15:00:00 10 mL via INTRAVENOUS

## 2024-03-03 ENCOUNTER — Ambulatory Visit: Payer: Self-pay | Admitting: Neurology

## 2024-03-03 NOTE — Progress Notes (Signed)
Tried calling patient no answer. LMOVM to call the office back.

## 2024-03-05 NOTE — Progress Notes (Signed)
Tried calling patient, No answer. LMOVM to call the office back.

## 2024-03-10 ENCOUNTER — Telehealth: Payer: Self-pay | Admitting: Neurology

## 2024-03-10 NOTE — Telephone Encounter (Signed)
 Left message with the after hour service on 03-10-24 at 12:25 pm  Caller states that she is calling for results

## 2024-03-10 NOTE — Telephone Encounter (Signed)
 Called patient and gave results.

## 2024-03-10 NOTE — Telephone Encounter (Signed)
 Results given to patient

## 2024-03-10 NOTE — Telephone Encounter (Signed)
-----   Message from Juliene Dunnings, DO sent at 03/03/2024  6:14 AM EST ----- MRI looks stable compared to prior one from August 2024.  I see no concerns.
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
## 2024-03-23 ENCOUNTER — Ambulatory Visit: Admitting: Neurology

## 2024-03-23 ENCOUNTER — Encounter: Payer: Self-pay | Admitting: Neurology

## 2024-03-23 VITALS — BP 113/76 | HR 89 | Ht 64.0 in | Wt 322.6 lb

## 2024-03-23 DIAGNOSIS — E348 Other specified endocrine disorders: Secondary | ICD-10-CM | POA: Diagnosis not present

## 2024-03-23 DIAGNOSIS — M5481 Occipital neuralgia: Secondary | ICD-10-CM | POA: Diagnosis not present

## 2024-03-23 MED ORDER — PREGABALIN 100 MG PO CAPS: 100.0000 mg | ORAL_CAPSULE | Freq: Two times a day (BID) | ORAL | 5 refills | Status: AC

## 2024-03-23 NOTE — Patient Instructions (Signed)
 Increased pregablin to 100mg  twice daily

## 2024-03-25 ENCOUNTER — Encounter: Payer: Self-pay | Admitting: Family Medicine

## 2024-03-25 ENCOUNTER — Ambulatory Visit: Admitting: Family Medicine

## 2024-03-25 DIAGNOSIS — G43709 Chronic migraine without aura, not intractable, without status migrainosus: Secondary | ICD-10-CM

## 2024-03-25 DIAGNOSIS — Z6841 Body Mass Index (BMI) 40.0 and over, adult: Secondary | ICD-10-CM | POA: Diagnosis not present

## 2024-03-25 DIAGNOSIS — F329 Major depressive disorder, single episode, unspecified: Secondary | ICD-10-CM

## 2024-03-25 DIAGNOSIS — F17210 Nicotine dependence, cigarettes, uncomplicated: Secondary | ICD-10-CM | POA: Diagnosis not present

## 2024-03-25 DIAGNOSIS — E538 Deficiency of other specified B group vitamins: Secondary | ICD-10-CM

## 2024-03-25 DIAGNOSIS — Z23 Encounter for immunization: Secondary | ICD-10-CM | POA: Diagnosis not present

## 2024-03-25 DIAGNOSIS — E559 Vitamin D deficiency, unspecified: Secondary | ICD-10-CM

## 2024-03-25 DIAGNOSIS — D72829 Elevated white blood cell count, unspecified: Secondary | ICD-10-CM | POA: Diagnosis not present

## 2024-03-25 NOTE — Progress Notes (Signed)
 "  Acute Office Visit  Patient ID: Lindsay James, female    DOB: [birth year], 34 y.o.   MRN: [id number]  PCP: Kayla Jeoffrey RAMAN, FNP  Chief Complaint  Patient presents with   Medical Management of Chronic Issues    Would like  lab ( blood ) pregnancy test    Referral to podiatry.      Subjective:     HPI Lindsay James is here today for chronic condition management. PMH include obesity, nicotine dependence, HTN, HLD, migraines, depression and anxiety.  Obesity: has tried diet and exercise without success, exercise is limited by pain in her ankles, knees, hips, this is likely confounded by her weight. She is working on reduced calorie diet.    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  History of Present Illness Lindsay James is a 34 year old female who presents for follow-up.  She recently sustained an ankle sprain and is currently wearing a boot. This injury occurred after her last visit in July. There are no new changes in her medical history since the ankle sprain.  She experiences headaches that continue to cause sharp pains. Her neurologist increased the dose of Lyrica  to 100 mg, but she continues to have sharp pains. Recent imaging showed cysts.  Regarding her  blood counts, she has not seen her oncologist since August of last year. At that time, she had labs drawn due to fatigue and was advised to take folic acid  and B12. She continues to take these supplements along with iron and vitamin D . She believes her white blood cell count remains high.  She continues to smoke despite attempts to quit. She is working on improving her diet and exercise routine and is considering joining a gym. She previously tried Wegovy  for weight management, but her insurance did not cover it.  Her mood has been affected by recent stressors, including relationship issues and family health problems. She sometimes feels overwhelmed but has support from a friend. She denies any thoughts of self-harm or harm to others. She is currently taking Wellbutrin , which was initially prescribed for smoking cessation but may also help with mood.  She has not received a flu shot this year and is interested in getting it. She also plans to receive the HPV vaccine.   Review of Systems  All other systems reviewed and are negative.   Past Medical History:  Diagnosis Date   Acute appendicitis    Allergy 2016   After my first child it got worst   Anemia    Chlamydia    GERD (gastroesophageal reflux disease)    I think when i was younger  but i dont take medicine for it right now   Gonorrhea    High cholesterol    Hypertension    Migraine  UTI (urinary tract infection)     Past Surgical History:  Procedure Laterality Date   IRRIGATION AND DEBRIDEMENT ABSCESS Left 01/31/2022   Procedure: INCISION AND DRAINAGE OF LEFT THIGH ABSCESS;  Surgeon: Paola Dreama SAILOR, MD;  Location: [facility] OR;  Service: General;  Laterality: Left;   LAPAROSCOPIC APPENDECTOMY N/A 01/18/2020   Procedure: APPENDECTOMY LAPAROSCOPIC;  Surgeon: Mavis Anes, MD;  Location: [facility];  Service: General;  Laterality: N/A;    Outpatient Medications Prior to Visit  Medication Sig Dispense Refill   buPROPion  (WELLBUTRIN   SR) 150 MG 12 hr tablet Take 1 tablet (150 mg total) by mouth 2 (two) times daily. Take 1 tablet once daily for first 3 days 120 tablet 1   cholecalciferol (VITAMIN D3) 25 MCG (1000 UNIT) tablet Take 1 tablet (1,000 Units total) by mouth daily. 90 tablet 0   cyanocobalamin  (VITAMIN B12) 1000 MCG tablet Take 1 tablet (1,000 mcg total) by mouth daily. 90 tablet 2   fluticasone  (FLONASE ) 50 MCG/ACT nasal spray Place 1 spray into both nostrils 2 (two) times daily. 16 g 0   folic acid  (FOLVITE ) 1 MG tablet Take 1 tablet (1 mg total) by mouth daily. 90 tablet 2   methocarbamol  (ROBAXIN ) 500 MG tablet Take 1 tablet (500 mg total) by mouth every 6 (six) hours as needed for muscle spasms. 20 tablet 0   naproxen  (NAPROSYN ) 500 MG tablet Take 1 tablet (500 mg total) by mouth 2 (two) times daily. (Patient taking differently: Take 500 mg by mouth as needed.) 30 tablet 0   pregabalin  (LYRICA ) 100 MG capsule Take 1 capsule (100 mg total) by mouth 2 (two) times daily. 60 capsule 5   No facility-administered medications prior to visit.    Allergies[1]     Objective:    BP 122/84   Pulse 88   Ht 5' 4 (1.626 m)   Wt (!) 318 lb 6.4 oz (144.4 kg)   LMP 02/23/2024 (Approximate)   SpO2 98%   BMI 54.65 kg/m    Physical Exam Vitals and nursing note reviewed.  Constitutional:      Appearance: Normal appearance. She is obese.  HENT:     Head: Normocephalic and atraumatic.  Cardiovascular:     Rate and Rhythm: Normal rate and regular rhythm.     Pulses: Normal pulses.     Heart sounds: Normal heart sounds.  Pulmonary:     Effort: Pulmonary effort is normal.     Breath sounds: Normal breath sounds.  Skin:    General: Skin is warm and dry.  Neurological:     General: No focal deficit present.     Mental Status: She is alert and oriented to person, place, and time. Mental status is at baseline.  Psychiatric:        Mood and Affect: Mood normal.        Behavior: Behavior normal.        Thought  Content: Thought content normal.        Judgment: Judgment normal.       No results found for any visits on 03/25/24.     Assessment & Plan:   Problem List Items Addressed This Visit       Cardiovascular and Mediastinum   Chronic migraine without aura without status migrainosus, not intractable     Other   Vitamin B12 deficiency disease   Relevant Orders   B12 and Folate Panel   Morbid obesity (HCC) - Primary   Relevant Orders  CBC with Differential/Platelet   Comprehensive metabolic panel with GFR   Lipid panel   Hemoglobin A1c   Reactive depression   Cigarette nicotine dependence without complication   Leukocytosis   Relevant Orders   CBC with Differential/Platelet   Other Visit Diagnoses       Vitamin D  deficiency       Relevant Orders   VITAMIN D  25 Hydroxy (Vit-D Deficiency, Fractures)     Need for vaccination       Relevant Orders   Flu vaccine trivalent PF, 6mos and older(Flulaval,Afluria,Fluarix,Fluzone) (Completed)       Assessment and Plan Assessment & Plan Woman's Wellness Visit Routine wellness visit with no new physical issues. Mood is well-managed with support from a friend. - Flu shot planned. - HPV vaccine planned.  Morbid obesity Limited exercise due to ankle sprain. Previous attempt to obtain Wegovy  was unsuccessful due to insurance coverage issues. - Check insurance coverage for weight management medications. - Encouraged diet and exercise as feasible.  Cigarette nicotine dependence Continues to smoke despite previous attempts to quit. Wellbutrin  prescribed for smoking cessation and mood stabilization. - Continue Wellbutrin  for smoking cessation and mood stabilization.  Chronic migraine Headaches persist despite increased dose of Lyrica . Potential for Botox injections if headaches persist. - Continue Lyrica  at increased dose. - Keep appointments with neurology  Reactive depression Mood is well-managed with support from a friend.  Wellbutrin  may aid in mood stabilization. - Continue Wellbutrin  for mood stabilization. - Offer referral to therapist if needed.  Vitamin B12 deficiency Currently taking B12 supplements. - Checked B12 levels.  Vitamin D  deficiency Currently taking vitamin D  supplements. - Checked vitamin D  levels.  Leukocytosis Labs drawn due to fatigue. Currently taking folic acid , B12, and iron supplements.  Ankle sprain Recent ankle sprain managed with a boot.    No orders of the defined types were placed in this encounter.   Return in about 6 months (around 09/22/2024) for annual physical with labs 1 week prior.  Jeoffrey GORMAN Barrio, FNP [facility]      [1]  Allergies Allergen Reactions   Peanut-Containing Drug Products Rash   "

## 2024-03-26 LAB — HEMOGLOBIN A1C
Hgb A1c MFr Bld: 5.3 %
Mean Plasma Glucose: 105 mg/dL
eAG (mmol/L): 5.8 mmol/L

## 2024-03-26 LAB — CBC WITH DIFFERENTIAL/PLATELET
Absolute Lymphocytes: 3016 {cells}/uL (ref 850–3900)
Absolute Monocytes: 910 {cells}/uL (ref 200–950)
Basophils Absolute: 26 {cells}/uL (ref 0–200)
Basophils Relative: 0.2 %
Eosinophils Absolute: 273 {cells}/uL (ref 15–500)
Eosinophils Relative: 2.1 %
HCT: 42 % (ref 35.9–46.0)
Hemoglobin: 12.7 g/dL (ref 11.7–15.5)
MCH: 23.3 pg — ABNORMAL LOW (ref 27.0–33.0)
MCHC: 30.2 g/dL — ABNORMAL LOW (ref 31.6–35.4)
MCV: 76.9 fL — ABNORMAL LOW (ref 81.4–101.7)
MPV: 10.9 fL (ref 7.5–12.5)
Monocytes Relative: 7 %
Neutro Abs: 8775 {cells}/uL — ABNORMAL HIGH (ref 1500–7800)
Neutrophils Relative %: 67.5 %
Platelets: 291 Thousand/uL (ref 140–400)
RBC: 5.46 Million/uL — ABNORMAL HIGH (ref 3.80–5.10)
RDW: 15.4 % — ABNORMAL HIGH (ref 11.0–15.0)
Total Lymphocyte: 23.2 %
WBC: 13 Thousand/uL — ABNORMAL HIGH (ref 3.8–10.8)

## 2024-03-26 LAB — LIPID PANEL
Cholesterol: 173 mg/dL
HDL: 41 mg/dL — ABNORMAL LOW
LDL Cholesterol (Calc): 113 mg/dL — ABNORMAL HIGH
Non-HDL Cholesterol (Calc): 132 mg/dL — ABNORMAL HIGH
Total CHOL/HDL Ratio: 4.2 (calc)
Triglycerides: 90 mg/dL

## 2024-03-26 LAB — COMPREHENSIVE METABOLIC PANEL WITH GFR
AG Ratio: 1.4 (calc) (ref 1.0–2.5)
ALT: 12 U/L (ref 6–29)
AST: 12 U/L (ref 10–30)
Albumin: 3.8 g/dL (ref 3.6–5.1)
Alkaline phosphatase (APISO): 81 U/L (ref 31–125)
BUN/Creatinine Ratio: 9 (calc) (ref 6–22)
BUN: 10 mg/dL (ref 7–25)
CO2: 28 mmol/L (ref 20–32)
Calcium: 8.8 mg/dL (ref 8.6–10.2)
Chloride: 102 mmol/L (ref 98–110)
Creat: 1.08 mg/dL — ABNORMAL HIGH (ref 0.50–0.97)
Globulin: 2.8 g/dL (ref 1.9–3.7)
Glucose, Bld: 86 mg/dL (ref 65–99)
Potassium: 4.2 mmol/L (ref 3.5–5.3)
Sodium: 137 mmol/L (ref 135–146)
Total Bilirubin: 0.3 mg/dL (ref 0.2–1.2)
Total Protein: 6.6 g/dL (ref 6.1–8.1)
eGFR: 70 mL/min/1.73m2

## 2024-03-26 LAB — VITAMIN D 25 HYDROXY (VIT D DEFICIENCY, FRACTURES): Vit D, 25-Hydroxy: 19 ng/mL — ABNORMAL LOW (ref 30–100)

## 2024-03-26 LAB — B12 AND FOLATE PANEL
Folate: 3.3 ng/mL — ABNORMAL LOW
Vitamin B-12: 234 pg/mL (ref 200–1100)

## 2024-03-31 ENCOUNTER — Ambulatory Visit: Payer: Self-pay | Admitting: Family Medicine

## 2024-05-28 ENCOUNTER — Other Ambulatory Visit

## 2024-09-30 ENCOUNTER — Ambulatory Visit: Payer: Self-pay | Admitting: Neurology

## 2024-10-14 ENCOUNTER — Other Ambulatory Visit

## 2024-10-19 ENCOUNTER — Encounter: Admitting: Family Medicine
